# Patient Record
Sex: Female | Born: 1935 | Race: White | Hispanic: No | Marital: Married | Smoking: Never smoker
Health system: Southern US, Community
[De-identification: ages and names within clinical notes are randomized; demographics above are authoritative.]

## PROBLEM LIST (undated history)

## (undated) DIAGNOSIS — N189 Chronic kidney disease, unspecified: Secondary | ICD-10-CM

## (undated) DIAGNOSIS — K229 Disease of esophagus, unspecified: Secondary | ICD-10-CM

## (undated) DIAGNOSIS — I219 Acute myocardial infarction, unspecified: Secondary | ICD-10-CM

## (undated) DIAGNOSIS — E049 Nontoxic goiter, unspecified: Secondary | ICD-10-CM

## (undated) DIAGNOSIS — M549 Dorsalgia, unspecified: Secondary | ICD-10-CM

## (undated) DIAGNOSIS — K439 Ventral hernia without obstruction or gangrene: Secondary | ICD-10-CM

## (undated) DIAGNOSIS — K5790 Diverticulosis of intestine, part unspecified, without perforation or abscess without bleeding: Secondary | ICD-10-CM

## (undated) DIAGNOSIS — I639 Cerebral infarction, unspecified: Secondary | ICD-10-CM

## (undated) DIAGNOSIS — J984 Other disorders of lung: Secondary | ICD-10-CM

## (undated) DIAGNOSIS — I10 Essential (primary) hypertension: Secondary | ICD-10-CM

## (undated) DIAGNOSIS — G8929 Other chronic pain: Secondary | ICD-10-CM

## (undated) DIAGNOSIS — F259 Schizoaffective disorder, unspecified: Secondary | ICD-10-CM

## (undated) DIAGNOSIS — D649 Anemia, unspecified: Secondary | ICD-10-CM

## (undated) DIAGNOSIS — F250 Schizoaffective disorder, bipolar type: Secondary | ICD-10-CM

## (undated) DIAGNOSIS — M1990 Unspecified osteoarthritis, unspecified site: Secondary | ICD-10-CM

## (undated) DIAGNOSIS — J189 Pneumonia, unspecified organism: Secondary | ICD-10-CM

## (undated) HISTORY — PX: CHOLECYSTECTOMY: SHX55

## (undated) HISTORY — PX: HERNIA REPAIR: SHX51

## (undated) HISTORY — PX: ABDOMINAL HYSTERECTOMY: SHX81

## (undated) HISTORY — PX: BACK SURGERY: SHX140

## (undated) HISTORY — PX: REVISION UROSTOMY CUTANEOUS: SUR1282

---

## 1998-06-03 ENCOUNTER — Ambulatory Visit (HOSPITAL_COMMUNITY): Admission: RE | Admit: 1998-06-03 | Discharge: 1998-06-03 | Payer: Self-pay | Admitting: Family Medicine

## 1998-06-30 ENCOUNTER — Inpatient Hospital Stay (HOSPITAL_COMMUNITY): Admission: EM | Admit: 1998-06-30 | Discharge: 1998-07-03 | Payer: Self-pay | Admitting: *Deleted

## 1998-10-08 ENCOUNTER — Ambulatory Visit (HOSPITAL_COMMUNITY)
Admission: RE | Admit: 1998-10-08 | Discharge: 1998-10-08 | Payer: Self-pay | Admitting: Physical Medicine & Rehabilitation

## 1998-10-08 ENCOUNTER — Encounter: Payer: Self-pay | Admitting: Physical Medicine & Rehabilitation

## 1999-04-27 ENCOUNTER — Emergency Department (HOSPITAL_COMMUNITY): Admission: EM | Admit: 1999-04-27 | Discharge: 1999-04-28 | Payer: Self-pay | Admitting: Internal Medicine

## 1999-05-01 ENCOUNTER — Emergency Department (HOSPITAL_COMMUNITY): Admission: EM | Admit: 1999-05-01 | Discharge: 1999-05-01 | Payer: Self-pay | Admitting: Emergency Medicine

## 1999-05-01 ENCOUNTER — Encounter: Payer: Self-pay | Admitting: Emergency Medicine

## 1999-06-26 ENCOUNTER — Emergency Department (HOSPITAL_COMMUNITY): Admission: EM | Admit: 1999-06-26 | Discharge: 1999-06-26 | Payer: Self-pay | Admitting: Emergency Medicine

## 1999-06-27 ENCOUNTER — Emergency Department (HOSPITAL_COMMUNITY): Admission: EM | Admit: 1999-06-27 | Discharge: 1999-06-27 | Payer: Self-pay | Admitting: Emergency Medicine

## 1999-06-28 ENCOUNTER — Inpatient Hospital Stay (HOSPITAL_COMMUNITY): Admission: EM | Admit: 1999-06-28 | Discharge: 1999-07-01 | Payer: Self-pay | Admitting: *Deleted

## 1999-07-09 ENCOUNTER — Inpatient Hospital Stay (HOSPITAL_COMMUNITY): Admission: EM | Admit: 1999-07-09 | Discharge: 1999-07-12 | Payer: Self-pay | Admitting: *Deleted

## 1999-10-20 ENCOUNTER — Inpatient Hospital Stay (HOSPITAL_COMMUNITY): Admission: EM | Admit: 1999-10-20 | Discharge: 1999-10-23 | Payer: Self-pay | Admitting: Emergency Medicine

## 2002-08-27 ENCOUNTER — Encounter
Admission: RE | Admit: 2002-08-27 | Discharge: 2002-11-25 | Payer: Self-pay | Admitting: Physical Medicine & Rehabilitation

## 2002-11-26 ENCOUNTER — Encounter
Admission: RE | Admit: 2002-11-26 | Discharge: 2003-02-24 | Payer: Self-pay | Admitting: Physical Medicine & Rehabilitation

## 2003-02-25 ENCOUNTER — Encounter
Admission: RE | Admit: 2003-02-25 | Discharge: 2003-05-26 | Payer: Self-pay | Admitting: Physical Medicine & Rehabilitation

## 2003-05-29 ENCOUNTER — Encounter
Admission: RE | Admit: 2003-05-29 | Discharge: 2003-08-27 | Payer: Self-pay | Admitting: Physical Medicine & Rehabilitation

## 2003-09-02 ENCOUNTER — Encounter
Admission: RE | Admit: 2003-09-02 | Discharge: 2003-12-01 | Payer: Self-pay | Admitting: Physical Medicine & Rehabilitation

## 2003-12-31 ENCOUNTER — Encounter
Admission: RE | Admit: 2003-12-31 | Discharge: 2004-03-30 | Payer: Self-pay | Admitting: Physical Medicine & Rehabilitation

## 2004-01-01 ENCOUNTER — Ambulatory Visit: Payer: Self-pay | Admitting: Physical Medicine & Rehabilitation

## 2004-10-18 ENCOUNTER — Encounter
Admission: RE | Admit: 2004-10-18 | Discharge: 2005-01-16 | Payer: Self-pay | Admitting: Physical Medicine & Rehabilitation

## 2004-10-18 ENCOUNTER — Ambulatory Visit: Payer: Self-pay | Admitting: Physical Medicine & Rehabilitation

## 2005-04-15 ENCOUNTER — Ambulatory Visit: Payer: Self-pay | Admitting: Physical Medicine & Rehabilitation

## 2005-04-15 ENCOUNTER — Encounter
Admission: RE | Admit: 2005-04-15 | Discharge: 2005-07-14 | Payer: Self-pay | Admitting: Physical Medicine & Rehabilitation

## 2005-09-13 ENCOUNTER — Encounter
Admission: RE | Admit: 2005-09-13 | Discharge: 2005-12-12 | Payer: Self-pay | Admitting: Physical Medicine & Rehabilitation

## 2005-09-23 ENCOUNTER — Ambulatory Visit: Payer: Self-pay | Admitting: Physical Medicine & Rehabilitation

## 2006-01-20 ENCOUNTER — Encounter
Admission: RE | Admit: 2006-01-20 | Discharge: 2006-04-20 | Payer: Self-pay | Admitting: Physical Medicine & Rehabilitation

## 2006-01-20 ENCOUNTER — Ambulatory Visit: Payer: Self-pay | Admitting: Physical Medicine & Rehabilitation

## 2006-07-19 ENCOUNTER — Ambulatory Visit: Payer: Self-pay | Admitting: Physical Medicine & Rehabilitation

## 2006-07-19 ENCOUNTER — Encounter
Admission: RE | Admit: 2006-07-19 | Discharge: 2006-10-17 | Payer: Self-pay | Admitting: Physical Medicine & Rehabilitation

## 2006-11-23 ENCOUNTER — Ambulatory Visit: Payer: Self-pay | Admitting: Physical Medicine & Rehabilitation

## 2006-11-23 ENCOUNTER — Encounter
Admission: RE | Admit: 2006-11-23 | Discharge: 2007-01-02 | Payer: Self-pay | Admitting: Physical Medicine & Rehabilitation

## 2007-03-16 ENCOUNTER — Ambulatory Visit: Payer: Self-pay | Admitting: Physical Medicine & Rehabilitation

## 2007-03-16 ENCOUNTER — Encounter
Admission: RE | Admit: 2007-03-16 | Discharge: 2007-03-19 | Payer: Self-pay | Admitting: Physical Medicine & Rehabilitation

## 2007-04-08 ENCOUNTER — Emergency Department (HOSPITAL_COMMUNITY): Admission: EM | Admit: 2007-04-08 | Discharge: 2007-04-08 | Payer: Self-pay | Admitting: Emergency Medicine

## 2007-06-28 ENCOUNTER — Encounter
Admission: RE | Admit: 2007-06-28 | Discharge: 2007-07-02 | Payer: Self-pay | Admitting: Physical Medicine & Rehabilitation

## 2007-06-28 ENCOUNTER — Ambulatory Visit: Payer: Self-pay | Admitting: Physical Medicine & Rehabilitation

## 2007-10-19 ENCOUNTER — Encounter
Admission: RE | Admit: 2007-10-19 | Discharge: 2007-10-22 | Payer: Self-pay | Admitting: Physical Medicine & Rehabilitation

## 2007-10-22 ENCOUNTER — Ambulatory Visit: Payer: Self-pay | Admitting: Physical Medicine & Rehabilitation

## 2008-02-11 ENCOUNTER — Ambulatory Visit: Payer: Self-pay | Admitting: Physical Medicine & Rehabilitation

## 2008-02-11 ENCOUNTER — Encounter
Admission: RE | Admit: 2008-02-11 | Discharge: 2008-02-11 | Payer: Self-pay | Admitting: Physical Medicine & Rehabilitation

## 2008-06-02 ENCOUNTER — Encounter
Admission: RE | Admit: 2008-06-02 | Discharge: 2008-06-03 | Payer: Self-pay | Admitting: Physical Medicine & Rehabilitation

## 2008-06-03 ENCOUNTER — Ambulatory Visit: Payer: Self-pay | Admitting: Physical Medicine & Rehabilitation

## 2010-08-17 NOTE — Assessment & Plan Note (Signed)
REASON FOR VISIT:  [name] returns to clinic today for followup  evaluation.  She does need to refill on her Oxycodone which she  continues to take approximately 10 tablets per day.  She gets reasonable  relief and tries to stay as active as possible.  She is requesting a  repeat script for a power scooter which she has had in the past.  The  present scooter that she has is worn down and she needs a replacement.  She reports that the scooter store has told her that she needs a script  from [place] to start the process to obtain one.   REVIEW OF SYSTEMS:  Positive for easy bleeding, diarrhea and shortness  of breath.   MEDICATIONS:  1. Oxycodone 5 mg 2 tablets p.o. 5 times per day p.r.n.  2. Elavil q.h.s.  3. Labetalol 100 mg.  4. Pepcid daily.  5. Diazepam 5 mg b.i.d. p.r.n.   PHYSICAL EXAMINATION:  GENERAL:  A well-appearing elderly adult female  in mild to no acute discomfort.  VITAL SIGNS:  Blood pressure 131/47 with a pulse of 84, respiratory rate  20 and O2 saturation 93% on room air.  EXTREMITIES:  She is 4+ to -5/5 strength to upper and lower extremities.  She ambulates with single point cane.   IMPRESSION:  1. Chronic low back pain with moderate lumbar spondylosis.  2. Right knee pain, status post arthroscopic surgery.  3. We did refill the patient's Oxycodone as of December 08, 2006.  We      also gave her a prescription for replacement power scooter which      she can obtain through the store that she had obtained her previous      one through.  4. We will plan on seeing her in followup in approximately 4 month's      time with refills prior to that appointment as necessary.           ______________________________  Ellwood Dense, M.D.     DC/MedQ  D:  11/24/2006 09:56:54  T:  11/25/2006 11:11:09  Job #:  [id number]

## 2010-08-17 NOTE — Assessment & Plan Note (Signed)
Rhonda [name] returns to the clinic today for followup evaluation.   She had been using oxycodone but we had to switch her to the  oxycodone/Tylenol at a 10/325 strength.  She reports that she has been  taking one of those which should be the equivalent of the oxycodone 5 mg  two tablets.  Unfortunately, she is not getting the benefit that she  needs.  I did speak with the pharmacist at the [facility] in [place].  They reported that they plan to have a shipment of oxycodone 5 mg coming  in this Wednesday, July 04, 2007.  In the meantime, they do have  oxycodone elixir at 5 mg/5 ml.   MEDICATIONS:  1. Percocet 10/325 one tablet 5 times per day p.r.n.  2. Elavil nightly.  3. Labetalol 100 mg daily.  4. Pepcid daily.  5. Diazepam 5 mg b.i.d. p.r.n.   REVIEW OF SYSTEMS:  Noncontributory.   PHYSICAL EXAMINATION:  GENERAL:  An elderly adult female in mild acute  discomfort.  VITAL SIGNS:  Blood pressure 144/69 with a pulse of 79, respiratory rate  18, and O2 saturation 93% on room air.  MUSCULOSKELETAL:  She ambulates without any assistive device.  She has 4  plus over 5 strength throughout the bilateral upper and lower  extremities.   IMPRESSION:  1. Chronic low back pain with moderate lumbar spondylosis.  2. Right knee pain, status post arthroscopic surgery.   In the office today, we did give the patient a script for oxycodone  elixir at 5 mg/5 ml to take 5-10 ml p.o. five times per day.  This will  be the equivalent of the oxycodone that she was taking previously.  We  have also given a new script for this straight oxycodone at 5 mg to be  used 1-2 tablets p.o. 5 times per day as needed as of July 05, 2007.  The pharmacy is reporting that they should have the supply in April 1st  or 2nd of this coming month.  We will plan on seeing the patient in  followup in approximately 3 to 4 months' time.  She understands that she  needs to take the oxycodone elixir until the oxycodone  tablets come in  and then switch over to the oxycodone.  She should stop the  oxycodone/Tylenol immediately at this point and save the pills that she  still has at home.  We will plan on seeing her in followup as noted  above.           ______________________________  Ellwood Dense, M.D.     DC/MedQ  D:  07/02/2007 12:23:26  T:  07/02/2007 12:59:09  Job #:  [id number]

## 2010-08-17 NOTE — Assessment & Plan Note (Signed)
Rhonda [name] returns to clinic today accompanied by her husband.  Overall,  she is doing well.  She has cut down her oxycodone slightly to  approximately 8 tablets per day and occasionally up to 10.  She does  need a refill on her oxycodone over the next several days but has a  sufficient supply at present.  She has obtained a scooter and uses that  regularly outside especially going to church on a weekly basis.  She is  very thankful to have that scooter available to her.   MEDICATIONS:  1. Oxycodone 5 mg 1-2 tablets p.o. 5 times per day p.r.n. (4-10 per      day).  2. Elavil nightly.  3. Labetalol 100 mg daily.  4. Pepcid daily.  5. Diazepam 5 mg b.i.d. p.r.n.   REVIEW OF SYSTEMS:  Positive for easy bleeding, constipation, and  shortness of breath.   PHYSICAL EXAMINATION:  GENERAL:  Elderly adult female in mild acute  discomfort.  VITAL SIGNS:  Not obtained.  EXTREMITIES:  She ambulates without any assistive device with a  shuffling gait.  She has 4+/5 strength throughout the bilateral upper  and lower extremities.   IMPRESSION:  1. Chronic low back pain with moderate lumbar spondylosis.  2. Right knee pain status post arthroscopic surgery.   In the office today, we did refill the patient's oxycodone as of  February 20, 2008.  She continues to do well overall and has actually  decreased the use of pain medicines.  She continues to use it very  reasonably without significant side effects or signs of diversion.  We  will plan on seeing the patient in followup in this office in  approximately 3-4 months' time with refills prior to that appointment as  necessary.           ______________________________  Ellwood Dense, M.D.     DC/MedQ  D:  02/11/2008 14:06:29  T:  02/12/2008 02:48:52  Job #:  [id number]

## 2010-08-17 NOTE — Assessment & Plan Note (Signed)
Ms. Welle returns today accompanied by her husband.  She has had no new  medical problems other than feeling that she may have some pain in her  flank area.  She has had a prior history of kidney stones.  She started  on MSM, which is an over-the-counter medication.  She states this has  helped her leg pain.  She states her average pain is 2/10, but it is up  to an 8 right now, interferes with activity at 7/10.  Sleep is fair.  Pain is worse with walking, bending, sitting, and standing.  Improves  with rest, heat, medications, and injections.  Relief from meds is good.  She thinks that taking 2 oxycodone 5 mg is better than taking one 10 mg.   INTERVAL MEDICAL HISTORY:  As seen Dr. Craige Cotta from [facility].  He has ordered MRI of the lumbar spine which he has not reviewed yet.   Her other review of system is positive for nausea and reflux.   SOCIAL HISTORY:  She is married, lives with her husband, who accompanies  her here today.   FAMILY HISTORY:  High blood pressure.   PAST SURGICAL HISTORY:  She has had 2 back surgeries which are fairly  remote.  She has had hysterectomy, hernia, and gallbladder surgery.  She  has a history of kidney stones.  She has a history of renal  insufficiency in the past as well.  She has some arthroscopic surgery on  the right knee in the past.   PHYSICAL EXAMINATION:  VITAL SIGNS:  Blood pressure 158/57, pulse 84,  respirations 20, and O2 sat 94% on room air.  GENERAL:  Overweight female in no acute stress.  Orientation x3.  Affect  is alert.  MUSCULOSKELETAL:  Gait, she has a stenotic-appearing gait.  She has no  toe drag or knee instability, but has decreased step length and wide  base support.  Her deep tendon reflexes are reduced in bilateral lower  extremities at the knees and ankles.  Her sensation is intact.  Her  coordination is intact.  Movements are slow.  Her extremities show no  evidence of edema.  Her lumbar spine range of motion  is fixed with  forward flexion, but only gets neutral on extension.  She has mild  scoliosis evident in the thoracolumbar junction levoconvex.  Lower  extremity strength is 5/5.  Hip range of motion is normal as his knee  and ankle range of motion.  Motor strength is normal in the lower  extremities as well as upper extremities.   IMPRESSION:  Chronic low back pain.  She appears to have some lumbar  spinal stenosis at least based on his physical exam and likely some  radicular symptoms.  I will be curious to see what her MRI shows.  She  may have the lumbosacral facet syndrome, which may be accounting for  back pain as well.  I asked her to get a copy of the MRI, so that I can  look at it.  We can think about doing some medial branch blocks or  epidural steroid injection.  We will continue on the current  medications.  I indicated that I am more comfortable for using 10 mg  oxycodone rather than having so many 5 mg.  He indicated that given her  age, she would have a high fall risk.  She was getting a 3 out of 60  tablets of the 5 mg and we will go to  the 10 mg and give #90 per month.  Her pain score has really not changed significantly.  Her average pain  is 2/10 currently.  I will see her back in about a month's time.  We  checked urine drug screen today.      Erick Colace, M.D.  Electronically Signed     AEK/MedQ  D:  06/03/2008 14:27:08  T:  06/04/2008 03:43:44  Job #:  [id number]

## 2010-08-17 NOTE — Assessment & Plan Note (Signed)
HISTORY:  Ms. Puzzo returns to clinic today accompanied by her husband.  Overall, she is doing well.  She is using her oxycodone but not nearly  as many tablets per day as she was previously.  We last gave her a  prescription on September 24, 2007, and she still has a total of 150  remaining.  She apparently has cut back to anywhere from 4-10 per day of  the oxycodone and previously had used as many as 12 per day.  She still  reports no confusion or constipation with the medication.   The patient last had kidney stones approximately 3 years ago and has  been doing well since that time.   MEDICATIONS:  1. Oxycodone 5 mg 1-2 tablets p.o. 6 times per day p.r.n. (4-10 per      day).  2. Elavil at bedtime.  3. Labetalol 100 mg.  4. Pepcid daily.  5. Diazepam 5 mg b.i.d. p.r.n.   REVIEW OF SYSTEMS:  Positive for night sweats and diarrhea.   PHYSICAL EXAMINATION:  GENERAL:  Elderly adult female in mild to no  acute discomfort.  VITAL SIGNS:  Blood pressure 119/70 with pulse of 82 and O2 saturation  91% on room air.  EXTREMITIES:  She ambulates without any assistive device with a  shuffling gait.  She has 4+/5 strength throughout the bilateral upper  and lower extremities.   IMPRESSION:  1. Chronic low back pain with moderate lumbar spondylosis.  2. Right knee pain, status post arthroscopic surgery.   In the office today, we did refill the patient's oxycodone, a total of  360 to be taken 1-2 tablets p.o. 6 times per day p.r.n. filled November 16, 2007; her present supply should last her till that time.  She  understands that she needs to guard this prescription and hand it in on  the prescribed date.  We will plan on seeing her in followup in  approximately 4 months' time, with refills prior to that appointment as  necessary.           ______________________________  Ellwood Dense, M.D.     DC/MedQ  D:  10/22/2007 12:28:40  T:  10/23/2007 05:56:02  Job #:  [id number]

## 2010-08-17 NOTE — Assessment & Plan Note (Signed)
Ms. Nell returns to the clinic today accompanied by her husband.  Overall, she is doing fairly well.  She does use the oxycodone 5 mg 2  tablets anywhere from 4 to 5 times per day, a maximum of 8-10 per day.  She reports that she gets reasonable relief with the medication.  Other  medicines have been unchanged.   CURRENT MEDICATIONS:  1. Oxycodone 5 mg 2 tablets 5 times per day p.r.n. (8-10 per day).  2. Elavil nightly.  3. Labetalol 100 mg daily.  4. Pepcid daily.  5. Diazepam 5 mg b.i.d. p.r.n.   REVIEW OF SYSTEMS:  Positive for high blood sugar and shortness of  breath along with night sweats.   PHYSICAL EXAM:  A well-appearing elderly adult female in mild to no  acute discomfort.  Blood pressure 130/54, pulse 80, respiratory rate 18, and O2 saturation  91% on room air.  She has 4+/5 strength throughout the bilateral upper and lower  extremities.  She ambulates with a single-point cane.   IMPRESSION:  1. Chronic low back pain with moderate lumbar spondylosis.  2. Right knee pain, status post arthroscopic surgery.   In the office today, we did refill the patient's oxycodone a total of  300, maximum of 10 per day as of March 21, 2007.  She has a  sufficient supply until Wednesday of this week.  She will be finishing  up her Macrodantin for recent urinary tract infection with IV  antibiotics being used for 5 days.  She has a history of frequent  urinary tract infections with kidney stones.  We will plan on seeing her  in followup in approximately 4 months' time with refills prior to that  appointment if necessary.           ______________________________  Ellwood Dense, M.D.     DC/MedQ  D:  03/19/2007 10:56:11  T:  03/19/2007 15:01:51  Job #:  [id number]

## 2010-08-20 NOTE — Discharge Summary (Signed)
[place]  Patient:    Rhonda [name], Rhonda [name]                        MRN: [id number] Adm. Date:  [id number] Disc. Date: [id number] Attending:  Anastasio Auerbach CC:         Redmond Baseman, M.D.             Francisca December, M.D.             Dr. Paticia Stack, [facility], [place]                           Discharge Summary  DATE OF BIRTH:  Aug 22, 1930.  DISCHARGE DIAGNOSES:  1. Chest pain.     a. Presumed gastroesophageal reflux disease.     b. Adenosine Cardiolite (September 06, 1999):  Normal, ejection fraction 86%.     c. A 2D echo (June 2001): Normal except for mild mitral regurgitation.     d. Spiral CT (October 20, 1999): No pulmonary embolus, bilateral lower lobe        interstitial disease.     e. Lower extremity Dopplers (October 20, 1999):  No deep venous thrombosis.  2. Chronic lung disease.     a. Nocturnal hypoxia documented.     b. Pulmonary function tests:  Decreased vital capacity, decreased        diffusion capacity (46%).     c. Admission arterial blood gas on room air (7.42/32/70).     d. Previous work-up (August 2000):  Negative pulmonary angiogram for        pulmonary embolus.  3. Complaints of night sweats x 1 year, significance unclear.  4. Bilateral metacarpophalangeal joint swelling x months, significance     unclear.  5. Chronic low back pain.     a. Status post surgery x 2.  6. Chronic lower extremity aches and pains.     a. Right lower extremity increased in size over left lower extremity        x 9 months.     b. No deep venous thrombosis by Dopplers this admission.  7. ? chronic renal insufficiency.     a. Creatinine 1.4 this admission.  8. Anemia (admission hemoglobin 10.5, MCV 83).     a. History of iron-deficiency and B12 deficiency.     b. This admission iron 30, total iron binding capacity 376, percent        saturation 8, B12 794, RBC folate 578.     c. TSH 1.83.  9. Mild leukopenia.     a. Total white count 3300, ANC 2300, ALC  700.     b. Platelets normal. 10. Chronically elevated right hemidiaphragm with chronic right lower lobe     atelectasis. 11. History of esophageal stricture/esophagitis.     a. Status post dilation in 1997.     b. Presented with symptoms of dysphagia. 12. Cystectomy with left ureterostomy (external patch).     a. History of neurogenic bladder secondary to cysts on the spine.     b. History of multiple surgeries prior to removal. 13. Multiple abdominal wall hernias, chronic.     a. Last attempted repair one year ago at [facility]. 14. Diverticular disease. 15. Anxiety.  DISCHARGE MEDICATIONS: 1. Risperdal 1.5 mg q.h.s. 2. Elavil 50 mg q.h.s. 3. OxyContin 10 mg q.6h. 4. Valium 5 mg q.d. 5. Aspirin 325 mg q.d. 6.  Oxygen two liters at night. 7. B12 shots monthly. 8. (New) Protonix 40 mg q.d.  CONDITION UPON DISCHARGE:  Rhonda [name] was pain-free 24 hours prior to discharge.  Vital signs were stable.  DISPOSITION:  The patient was discharged home with her family.  CONSULTANTS: 1. Barbaraann Share, M.D. 2. Francisca December, M.D.  PROCEDURES: 1. Spiral chest CT (October 20, 1999):  No CT evidence of acute PE.  Hazy    bilateral lower lobe opacity.  Although some of this may be related to    chronic interstitial disease, superimposed acute alveolitis could have    similar appearance (no clinical basis for this).  No CT evidence of DVT. 2. Chest x-ray (October 20, 1999):  Stable elevation of the right hemidiaphragm    with bibasilar atelectasis. 3. EKG:  Normal sinus rhythm.  No abnormalities. 4. Lower extremity Dopplers (October 21, 1999):  No DVT, SVT or Bakers cyst. 5. Abdominal ultrasound (October 23, 1999):  Small gallstone versus gallbladder    polyp.  Echogenic left renal cortex with associated thinning.  No active    abdominal process evident. 6. Upper GI series (October 22, 1999):  Preliminary report obtained orally    was negative.  Dictated report is still not available on the date of  this    dictation.  HOSPITAL COURSE:  #1 - CHEST PAIN.  Rhonda [name] is a 77 year old female with chronic lung disease requiring oxygen at night who presents with severe left-sided chest tightness which she awakened with in the morning.  She had associated shortness of breath, diaphoresis and nausea.  She presented to her primary care doctors office and was referred to the emergency room.  She received nitroglycerin with some relief.  Serial EKGs and enzymes were completely normal.  Rhonda [name] was placed on telemetry and had no abnormalities.  We asked Dr. Amil Amen to see the patient.  He had previously seen her in June for some complaints of nighttime anterior chest discomfort and he performed a Cardiolite which was normal in June of this year.  He talked with the patient and felt that her symptoms were not related to cardiac etiology.  We did look for pulmonary embolus on this admission.  She had a negative spiral CT and negative Dopplers and a negative D-dimer.  In reviewing her old records and talking with her pulmonologist, Dr. Paticia Stack, she had been seen in August of last year for her shortness of breath and received extensive work-up including ultimately pulmonary angiogram to rule out PE.  This was negative.  Rhonda [name] did have an AA gradient on admission with a pH of 7.42, pCO2 of 32 and a pO2 of 70 but again, she has significant underlying lung disease.  Rhonda [name] subsequently underwent gallbladder ultrasound and upper GI series, neither of which demonstrated clear etiology for symptoms.  She was placed on Protonix empirically on admission.  On the day of discharge she had been symptom-free for over 24 hours.  We felt it was reasonable to send her home with follow-up with her primary care physician.  She will return if her symptoms recur.  #2 - ANEMIA.  Rhonda [name] did not have a recent baseline hemoglobin.  She does have a history of iron-deficiency as well as B12  deficiency and her hemoglobin  was 10.5 with an MCV of 83 this admission.  I did do iron studies which again demonstrated an iron-deficiency anemia. She will follow up with Redmond Baseman, M.D., and at that  time I would recommend placing her on iron therapy and referring her back to her gastroenterologist in [place], [place], unless there is other clear etiology of why she is iron deficient.  #3 - CHRONIC LUNG DISEASE.  Rhonda [name] has had chronic lung disease followed by Dr. Paticia Stack.  In April of this year she had documented saturations lower than 90% for greater than one third of the night.  For this reason she was placed on nocturnal oxygen.  Rhonda [name] demonstrated lower lobe abnormalities by CT scan.  The radiologist questioned alveolitis but the patient was not having any increased cough or other difficulties to suggest this to be an acute problem. I did have Dr. Shelle Iron see her here and he felt that her chest pain was not related to her underlying lung problems.  She does have atelectasis which increases the prominence of lung markings on CT scan.  There was ground glass infiltrates in the superior segments which etiology is unclear. There was nothing to suggest pneumonia.   The patient will follow up with Dr. Paticia Stack.  DISCHARGE LABS:  Hemoglobin 9.8, MCV 84, wbc 3600, platelet count 152.  Retic percentage 1.3.  PT and PTT normal.  D-dimer 0.21 (reference range 0-0.48). Comprehensive metabolic panel normal.  Serial cardiac enzymes normal.  TSH 1.311.  Iron 30, TIBC 376, percent saturation 8.  B12 794, rbc folate 578. Rheumatoid factor 39 (reference range 0-20).  ANA pending upon discharge.  ADDENDUM:  Weight loss and joint pain.  Rhonda [name] gives a history of some weight loss, night sweats and joint swelling.  I could not document any fevers nor weight discrepancies from old charts.  We did do a rheumatoid factor which was borderline at 30.  ANA was pending upon  discharge. Will defer further work-up to Dr. Modesto Charon as an outpatient. DD:  10/26/99 TD:  10/28/99 Job: [id number]

## 2010-08-20 NOTE — Assessment & Plan Note (Signed)
HISTORY OF PRESENT ILLNESS:  Rhonda [name] returns to the clinic today for  followup evaluation accompanied by her husband.  Overall, she is doing  reasonably well.  She has had repeat urinary tract infections with a history  of recent lithotripsy for renal calculi.  She is on Amoxicillin chronically  at this time.   In terms of her pain medicines, she uses the Oxycodone but has to take 2 at  a time.  She generally takes 2 tablets 3 to 4 times per day.  Most of the  time, she is able to get away with taking 6 but occasionally has to go up to  8.  She would like to have 8 available to her on a daily basis.  She does  not overuse the medication.  She reports that she tries to get out as much  as possible and continues to drive her vehicle as she wishes.   MEDICATIONS:  1.  Oxycodone 5 mg, 2 tablets q.i.d. p.r.n.  2.  Elavil at night.  3.  Valium p.r.n.  4.  Sorbitol p.r.n.  5.  Pepcid daily.   PHYSICAL EXAMINATION:  GENERAL:  An elderly, frail, adult female in mild to  no acute discomfort.  VITAL SIGNS:  Blood pressure is 135/64 with a pulse of 88, respiratory rate  16, and O2 saturation 96% on room air.  PAIN AND REHAB EVALUATION:  She has 5-/5 strength in the bilateral upper and  lower extremities.  Bulk and tone were normal, and reflexes were 2+ and  symmetrical.   IMPRESSION:  1.  Chronic low back pain with moderate lumbar spondylosis (721.3).  2.  Right knee pain, status post arthroscopic surgery (719.46).   In the office today, we did refill the patient's Oxycodone at 5 mg, 2  tablets p.o. q.i.d., a total of 240.  We will plan on seeing the patient in  followup in this office in approximately 4 to 6 months time with refills of  medication prior to that appointment as necessary.           ______________________________  Ellwood Dense, M.D.     DC/MedQ  D:  04/18/2005 11:55:30  T:  04/18/2005 18:52:39  Job #:  [id number]

## 2010-08-20 NOTE — Assessment & Plan Note (Signed)
REASON FOR VISIT:  [name] returns to clinic today for followup  evaluation.  She has been seeing a urologist by the name of Dr. Orvil Feil.  This physician is at [facility].  He apparently has been monitoring her  creatinine and advised her that she may need dialysis.  He has set her up to  see a nephrologist, Dr. Julien Girt, approximately April 05, 2006.  The patient  reports that the only numbers that she remembers are creatinine level of 1.7  some week ago.   The patient reports that she is getting good relief from her oxycodone use,  5 mg, usually two tablets q.i.d.  She occasionally uses more than 8 per day  but on other days uses less than 8 tablets averaging a total of 8 per day.  She does not need a refill on those in the office today.   The patient reports that she recently completed an antibiotic for a urinary  tract infection.   MEDICATIONS:  1. Oxycodone 5 mg, two tablets q.i.d. p.r.n.  2. Elavil q.h.s.  3. Labetalol 100 mg daily.  4. Pepcid daily.  5. Diazepam 5 mg b.i.d. p.r.n.   REVIEW OF SYSTEMS:  Positive for night sweats, fevers and chills, easy  bleeding and shortness of breath.   PHYSICAL EXAMINATION:  GENERAL APPEARANCE:  Reasonably well-appearing,  elderly, female in mild to no acute discomfort.  VITAL SIGNS:  Blood pressure 119/52 with pulse 78, respiratory rate 16,  oxygen saturation 96% on room air.  MUSCULOSKELETAL:  She has 4+ to 5-/5 strength throughout the bilateral upper  and lower extremities.  She ambulates without any assistive devices.   IMPRESSION:  1. Chronic low back pain with moderate lumbar spondylosis (721.3).  2. Right knee pain, status post arthroscopic surgery (719.46).   PLAN:  In the office today, no refill on her oxycodone is necessary.  We  have given her a refill on her Diazepam at 5 mg one p.o. b.i.d.  She is in  between seeing her new primary care physician.  That appointment is not  until early November and I think she will run  out of the Diazepam by that  time.  I have asked her to have future refills through her new primary care  physician, once she is established in early November.   In terms of her renal function, she will wait to get the second opinion with  Dr. Julien Girt, her nephrologist-to-be.  She already has had Dr. Orvil Feil  recommend possible dialysis.  Apparently no renal vitamins were started and  she was not placed on any diet restrictions at this time.   We will plan on seeing the patient in followup in approximately six months'  time with refills prior to that appointment as necessary.           ______________________________  Ellwood Dense, M.D.    DC/MedQ  D:  01/23/2006 12:21:06  T:  01/24/2006 10:17:45  Job #:  [id number]

## 2010-08-20 NOTE — Discharge Summary (Signed)
[place]  Patient:    Rhonda [name], Rhonda [name]                        MRN: [id number] Adm. Date:  [id number] Disc. Date: [id number] Attending:  Anastasio Auerbach CC:         Redmond Baseman, M.D.             Francisca December, M.D.                           Discharge Summary  DATE OF BIRTH:  04/11/1930.  ADDENDUM:  Results of upper GI series (October 22, 1999):  Small hiatal hernia with reflux up to the mid thorax.  There was evidence of poor motility and propagation.  On multiple views, there was a slight mucosal irregularity noted in the posterior esophagus.  Will attempt to contact patient with these results and forward them to her primary physician, Dr. Leodis Sias.  It will be necessary for her to be referred back to her gastroenterologist for further evaluation. DD:  10/26/99 TD:  10/28/99 Job: [id number]
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
## 2010-08-20 NOTE — Assessment & Plan Note (Signed)
Ms. Doty returns to clinic today accompanied by her husband.  She is  reportedly doing well overall.  She reports that her kidney stones have  been stabilized, although she still has them present.  They are not  causing any obstructive at the present time.  She reports that she  generally takes the oxycodone, approximately 8 tablets per day, but does  feel that she needs an extra small dose periodically when she goes to  the store and tries to do some prolonged walking.  She has been very  compliant with medication to this point.   REVIEW OF SYSTEMS:  Noncontributory.   MEDICATIONS:  1. Oxycodone 5 mg 2 tablets q.i.d. p.r.n.  2. Elavil nightly.  3. Labetalol 100 mg daily.  4. Pepcid daily.  5. Diazepam 5 mg b.i.d. p.r.n.   PHYSICAL EXAMINATION:  A well-appearing elderly adult female in mild  acute discomfort.  Blood pressure is 133/73 with a pulse of 79.  Respiratory rate 16.  Her  O2 saturation is 96% on room air.  Patient has 4+ to 5/5 strength throughout her bilateral upper and lower  extremities.   IMPRESSION:  1. Chronic low back pain with moderate lumbar spondylosis.  2. Right knee pain, status post arthroscopic surgery.   In the office today we did refill the patient's oxycodone as of July 21, 2006.  We will plan on seeing her in followup in approximately 6  months' time with refills prior to that appointment as necessary.  I did  allow her to increase up to occasionally 5 times per day instead of 4  times per day, especially when she is active going to the grocery store  and shopping.           ______________________________  Ellwood Dense, M.D.     DC/MedQ  D:  07/20/2006 10:19:51  T:  07/20/2006 13:15:09  Job #:  [id number]

## 2010-08-20 NOTE — Assessment & Plan Note (Signed)
Rhonda [name] returns to clinic today for follow-up evaluation.  She,  unfortunately, has been hospitalized for approximately 10 days back in late  April.  She was in the intensive care unit for five of those 10 days.  She  was released in late April after having been treated for double pneumonia  along with septicemia related to a urinary tract infection.  She has a  history of ongoing renal stones and has had treatment in the past, but  remains on antibiotic for prophylaxis.  She does report that she gets good  relief from her oxycodone used 5 mg two tablets q.i.d.   MEDICATIONS:  1.  Oxycodone 5 mg two tablets q.i.d. p.r.n.  2.  Elavil q.h.s.  3.  Augmentin b.i.d. x10 days.  4.  Labetalol 100 mg daily.  5.  Sorbitol p.r.n.  6.  Pepcid daily.   REVIEW OF SYSTEMS:  Positive for easy bleeding and coughing along with  shortness of breath.   PHYSICAL EXAMINATION:  GENERAL:  Reasonably well-appearing, elderly female  in mild to no acute distress.  VITAL SIGNS:  Blood pressure 105/63 with a pulse of 76, respiratory rate 16,  and O2 saturation 94% on room air.  NEUROLOGIC:  She has 5-/5 strength throughout the bilateral upper and lower  extremities.  She ambulates without any assistive device.  She has poor  flexion and extension in a standing position in her lumbar spine.   IMPRESSION:  1.  Chronic low back pain with moderate lumbar spondylosis (721.3).  2.  Right knee pain status post arthroscopic surgery (719.46).   In the office today we did refill the patient's oxycodone as of October 08, 2005.  A total of 240 were prescribed which should cover her for one month's  time.  She is doing very well with good analgesic effect and no adverse side  effects.  She shows no sign of apparent addictive qualities.           ______________________________  Ellwood Dense, M.D.     DC/MedQ  D:  09/26/2005 11:45:28  T:  09/26/2005 12:55:23  Job #:  [id number]

## 2010-08-20 NOTE — Assessment & Plan Note (Signed)
[name] returns to clinic today for follow up evaluation.  She is  accompanied to the office today by her husband.  The patient reports that  the Oxycodone medication that she is taking is working well for her at the  present time.  She continues to be as active as possible around the home.  She does occasionally get constipated and then takes Dulcolax tablets in  addition to her daily sorbitol.  She has had some recent increased bowel  movements secondary to the Dulcolax tablets and some extra grumbling of her  abdomen.  She would like to have some medication to use on a regular basis  for improvement in her bowel regimen.   MEDICATIONS:  1. Oxycodone 5 mg 1-3 tablets p.o. q.i.d. p.r.n.  2. Elavil q.h.s.  3. Valium 5 mg p.r.n.  4. Sorbitol daily.  5. Pepcid daily.   PHYSICAL EXAMINATION:  Well-appearing, elderly female in no acute distress.  Blood pressure is measured at 140/74 with a pulse of 83, respiratory rate 16  and O2 saturations 92 to 93% on room air.  Strength was 5 minus/5 throughout  the bilateral upper and lower extremities.  Bulk and tone were normal and  reflexes were 2+ and symmetrical.  The patient ambulates without any  assisted device.   IMPRESSION:  1. Chronic low back pain with moderate lumbar spondylosis (721.3).  2. Right knee pain, status post arthroscopic surgery (719.46).  3. Status post left ankle fracture, stable.   In the clinic today I did refill the patient's Oxycodone as of June 22, 2003, a total of 180 tablets.  That will save her husband a trip in for  refill of medication.   I will plan on seeing the patient in follow up in approximately 3 month's  time.  She  continues to do well and I have given her name of Senokot-S to  be used on a p.r.n. daily basis for improvement in her bowel program.      Ellwood Dense, M.D.   DC/MedQ  D:  06/02/2003 12:43:11  T:  06/02/2003 13:00:38  Job #:  [id number]

## 2010-08-20 NOTE — Assessment & Plan Note (Signed)
INTERVAL HISTORY:  Rhonda [name] returns to clinic today for followup  evaluation.  She does report that she is experiencing kidney stone symptoms  treated by Dr. Saintclair Halsted.  She also reports that Dr. Saintclair Halsted wants to rule  out any gallbladder stones and she is due for a gallbladder study.  She has  had lithotripsy for kidney stones in the past approximately 2-3 years ago.   The patient reports that she is using her oxycodone 5 mg two tablets q.i.d.  with an occasional extra dose.  She was last given the prescription  December 10, 2003 and has approximately 28 left which we will require refill  over the next couple days.  We will refill that for her in the office today.   MEDICATIONS:  1.  Oxycodone 5 mg approximately two tablets q.i.d. p.r.n.  2.  Pepcid once daily.  3.  Sorbitol b.i.d.  4.  Valium 5 mg p.r.n.  5.  Elavil at bedtime.   PHYSICAL EXAMINATION:  Reasonably well-appearing elderly female in no acute  distress.  Blood pressure 119/67 with a pulse of 88, respiratory rate 16 and  O2 saturation 99% on room air.  The patient has 5-/5 strength throughout the  bilateral upper and lower extremities.  She ambulates without any assistive  device but does need contact guard assistance to come to the standing  position.  She has a sliding forward flexed posture at the waist.   IMPRESSION:  1.  Chronic low back pain with moderate lumbar spondylosis (72130).  2.  Right knee pain, status post arthroscopic surgery (16109).   In the office today I did refill her oxycodone 5 mg two tablets p.o. q.i.d.  p.r.n. as of January 03, 2004.  She reports that she has sufficient other  medications at this time.  I have cautioned her about only using an  occasional extra oxycodone in addition to the eight that she uses daily at  this point.   I will plan on seeing her in followup in approximately 3 months' time with  refills through her husband prior to that appointment.       DC/MedQ  D:   01/01/2004 60:45:40  T:  01/01/2004 98:11:91  Job #:  [id number]

## 2010-08-20 NOTE — Consult Note (Signed)
[place]  Patient:    Rhonda [name], Rhonda [name]                        MRN: [id number] Proc. Date: 10/22/99 Adm. Date:  [id number] Attending:  Anastasio Auerbach CC:         Anastasio Auerbach, M.D.             Redmond Baseman, M.D.             Francisca December, M.D.             Barbaraann Share, M.D. [facility]                          Consultation Report  HISTORY OF PRESENT ILLNESS:  Patient is a pleasant 77 year old white female who I have been asked to see for an abnormal chest x-ray/CT scan.  Patient was admitted with chest pain that has both typical and atypical features; however denies pleuritic chest pain.  She had a negative Cardiolite on September 03, 1999 and negative cardiac enzymes on this admission.  An echo also shows excellent LV function and in fact borders on hypertrophic physiology.  On chest x-ray the patient has had a chronically elevated right hemidiaphragm but she is unsure if that has ever been proven to be paralyzed by pleuroscopy.  She sees Dr. ______ in [facility] and is on nocturnal oxygen for O2 desaturation probably related to her diaphragm and obesity.  She has had a documented restriction by PFTs secondary to the above and diminished ELCO; however, it is unclear whether this normalizes with alveolar volume adjustment.  Patient has undergone a spiral CT scan here which is negative for PE, but did show some ground glass infiltrate in the superior segments of both lower lobes.  It also showed increased markings in both bases, right greater than left.  There has been on DVT noted by lower extremity Dopplers.  Patient is chronically dyspneic with no recent acceleration in her symptoms.  She denies any chronic airwave symptoms or disease and her PFTs did not reveal any obstruction.  PAST MEDICAL HISTORY:  Quite extensive. 1. Chronic renal insufficiency. 2. History of anemia. 3. History of right elevated hemidiaphragm. 4. Status post cystectomy with left  ureterostomy/external pouch secondary to    neurogenic bladder. 5. History of multiple abdominal hernias requiring surgical repair. 6. History of diverticular disease. 7. History of anxiety. 8. History of esophageal stricture and esophagitis with dilatation in 1997.  ALLERGIES:  SENNA, TYLENOL, MACRODANTIN.  SOCIAL HISTORY:  She has never smoked.  She is currently married.  FAMILY HISTORY:  Remarkable for her father having coronary disease, but is living at age 14.  She has a brother with coronary artery disease.  REVIEW OF SYSTEMS:  As per history of present illness.  PHYSICAL EXAMINATION:  GENERAL:  She is an obese white female in no acute distress.  VITAL SIGNS:  Blood pressure 140/80.  Pulse 97.  She is afebrile.  Respiratory rate approximately 19-20.  Saturation on 2 L 97%.  HEENT:  Pupils are equal, round and reactive to light.  Extraocular movements are intact.  Nares are patent without discharge.  Oropharynx is clear.  NECK:  Supple without JVD or lymphadenopathy.  No palpable thyromegaly.  CHEST:  Fairly clear breath sounds throughout.  CARDIAC:  Regular rate and rhythm.  No murmurs, rubs, or gallops.  ABDOMEN:  Soft,  nontender with good bowel sounds.  GENITAL:  Not done.  Not indicated.  RECTAL:  Not done.  Not indicated.  BREASTS:  Not done.  Not indicated.  EXTREMITIES:  Lower extremities without significant edema with good pulses distally.  They were mildly diminished, but symmetrical.  There is no calf tenderness.  NEUROLOGIC:  Alert and oriented x 3.  No obvious motor deficits.  LABORATORIES:  Chest x-ray was reviewed and showed atelectasis in both lower lobes, right greater than left due to the elevated right hemidiaphragm.  CT scan as per history of present illness.  IMPRESSION: 1. Atypical and typical chest pain of unknown etiology.  I really see nothing    from a pulmonary standpoint to explain this.  I doubt that she has had a PE    and there  is a very low clinical suspicion by history.  Since her cardiac    work-up has been unrevealing, I would definitely pursue a gastrointestinal    work-up to see if that will lead to an etiology. 2. Basilar pulmonary infiltrates of unknown etiology.  I would be very careful    in calling this interstitial lung disease.  The patient clearly has    atelectasis right greater than left which increases the prominence of the    lung markings on CT scan in the same distribution.  I would be more    concerned about the ground glass densities noted in the superior segments    that are really of unknown etiology.  There is no suggestion of pneumonia    currently.  Nevertheless, the patient is oxygenating well.  Has had no    change in her usual baseline dyspnea and this is being followed by Dr.    ______ as an outpatient.  This is not related to her chest pain.  SUGGESTION:  No further inpatient pulmonary work-up required.  She does need to follow up with Dr. ______ at discharge. DD:  10/23/99 TD:  10/23/99 Job: [id number]

## 2010-08-20 NOTE — Assessment & Plan Note (Signed)
MEDICAL RECORD NUMBER:  [id number].   Ms. Severtson returns to the clinic today for followup evaluation. She reports  that she is doing well overall. Unfortunately, since June of 2005, she has  had two hospitalizations for the same problem. Apparently, they felt that  she had probable colon cancer, and we are evaluating her for that. They  subsequently found out that she had bleeding ulcers, and they treated those,  and her bleeding has resolved. She has been told that she does not have  cancer now at this point.   The patient continues to use the oxycodone 5 mg 2 tablets approximately 4  times a day for a total of 8 per day. She still reports decent relief on the  medication. She does need a refill over the next couple of days and will  refill that as noted below.   MEDICATIONS:  1.  Oxycodone 5 mg approximately 2 tablets q.i.d. p.r.n. (approximately 8      per day).  2.  Elavil q.h.s.  3.  Valium 5 mg q.d. p.r.n.  4.  Sorbitol q.d. p.r.n.  5.  Protonix 40 mg q.d.   PHYSICAL EXAMINATION:  Well-appearing elderly female in no acute distress.  Blood pressure 145/64 with pulse of 88, respiratory rate 16, and O2  saturation 94% on room air. She has 5-/5 strength throughout the bilateral  upper and lower extremities. Bulk and tone were normal, and reflexes were 2+  and symmetrical.   IMPRESSION:  1.  Chronic low back with moderate lumbar spondylosis (721.3).  2.  Right knee pain, status post arthroscopic surgery (719.46).   In the clinic today, I did refill her oxycodone 5 mg 2 tablets p.o. q.i.d.  p.r.n., a total of 120 as of tomorrow. We will plan on seeing the patient in  followup in approximately six months' time with refills prior to that  appointment if necessary. She continues to be extremely compliant with all  medications with no diversion or adverse behavior. She continues to tolerate  the medications without significant side effects.       DC/MedQ  D:  03/22/2004  13:15:36  T:  03/23/2004 13:03:57  Job #:  [id number]

## 2010-08-20 NOTE — Assessment & Plan Note (Signed)
MEDICAL RECORD NUMBER:  [id number]   DATE OF BIRTH:  [birth year]   REASON FOR EVALUATION:  Rhonda [name] returns to the clinic today for followup  evaluation.  She reports that overall she is doing well.  She has passed a  kidney stone approximately 3 weeks ago through her urostomy tube and reports  that her pain has improved.  She continues to take the oxycodone for her  chronic low back pain and she generally takes 8 tablets per day.  She did  have a refill approximately 10 days ago, but will need a refill in early  August.  She continues to be followed by her primary care physician, Dr.  Alben Spittle, and her GI specialist, Dr. Bryson Dames.  Dr. Bryson Dames is apparently  evaluating her presently for diarrhea non-responsive to over the counter  antidiarrheal medicines.   MEDICATIONS:  1.  Oxycodone 5 mg approximately two tablets 4 times daily p.r.n.      (approximately 6-8 per day).  2.  Elavil nightly.  3.  Valium p.r.n.  4.  Sorbitol p.r.n.  5.  Pepcid daily.   PHYSICAL EXAMINATION:  GENERAL:  Elderly appearing, pleasant and cooperative  adult female in mild acute discomfort.  VITAL SIGNS:  Blood pressure 126/78 with a pulse of 78, respiratory rate 16,  and O2 saturation 95% on room air.  NEUROMUSCULAR:  She has 5-/5 strength in her bilateral upper and lower  extremities.  Bulk and tone were normal and reflexes were 2+ and  symmetrical.   IMPRESSION:  1.  Chronic low back pain with moderate lumbar spondylosis (721.3).  2.  Right knee pain, status post arthroscopic surgery (719.46).   PLAN:  In the office today, we did refill her oxycodone 5 mg one to two  tablets p.o. 4 times daily p.r.n., a total of 180 as of November 02, 2004.  We  will plan on seeing her in followup in approximately 6 months' time.  She  continues to be very compliant with medication and takes medication only as  necessary.  There has been no sign of any problems related to the medication  at this point.  We will plan  on seeing her in followup as noted above.       DC/MedQ  D:  10/20/2004 14:51:31  T:  10/21/2004 05:30:21  Job #:  [id number]

## 2010-09-23 ENCOUNTER — Emergency Department (HOSPITAL_COMMUNITY)
Admission: EM | Admit: 2010-09-23 | Discharge: 2010-09-23 | Disposition: A | Discharge status: Home / Self Care | Payer: Medicare Other | Attending: Emergency Medicine | Admitting: Emergency Medicine

## 2010-09-23 DIAGNOSIS — G8929 Other chronic pain: Secondary | ICD-10-CM | POA: Insufficient documentation

## 2010-09-23 DIAGNOSIS — M549 Dorsalgia, unspecified: Secondary | ICD-10-CM | POA: Insufficient documentation

## 2010-09-23 DIAGNOSIS — Z79899 Other long term (current) drug therapy: Secondary | ICD-10-CM | POA: Insufficient documentation

## 2011-10-21 ENCOUNTER — Encounter (HOSPITAL_COMMUNITY): Payer: Self-pay | Admitting: *Deleted

## 2011-10-21 ENCOUNTER — Emergency Department (HOSPITAL_COMMUNITY): Payer: Medicare Other

## 2011-10-21 ENCOUNTER — Emergency Department (HOSPITAL_COMMUNITY)
Admission: EM | Admit: 2011-10-21 | Discharge: 2011-10-21 | Disposition: A | Discharge status: Home / Self Care | Payer: Medicare Other | Attending: Emergency Medicine | Admitting: Emergency Medicine

## 2011-10-21 DIAGNOSIS — F22 Delusional disorders: Secondary | ICD-10-CM | POA: Insufficient documentation

## 2011-10-21 DIAGNOSIS — M545 Low back pain, unspecified: Secondary | ICD-10-CM | POA: Insufficient documentation

## 2011-10-21 DIAGNOSIS — I10 Essential (primary) hypertension: Secondary | ICD-10-CM | POA: Insufficient documentation

## 2011-10-21 DIAGNOSIS — F209 Schizophrenia, unspecified: Secondary | ICD-10-CM | POA: Insufficient documentation

## 2011-10-21 HISTORY — DX: Essential (primary) hypertension: I10

## 2011-10-21 LAB — URINALYSIS, ROUTINE W REFLEX MICROSCOPIC
Nitrite: POSITIVE — AB
Protein, ur: >300 mg/dL — AB
Specific Gravity, Urine: 1.013 (ref 1.005–1.030)
Urobilinogen, UA: 0.2 mg/dL (ref 0.0–1.0)

## 2011-10-21 LAB — CBC WITH DIFFERENTIAL/PLATELET
HCT: 38.4 % (ref 36.0–46.0)
Hemoglobin: 12.7 g/dL (ref 12.0–15.0)
Lymphocytes Relative: 27 % (ref 12–46)
Lymphs Abs: 1.7 10*3/uL (ref 0.7–4.0)
MCHC: 33.1 g/dL (ref 30.0–36.0)
Monocytes Absolute: 0.6 10*3/uL (ref 0.1–1.0)
Monocytes Relative: 10 % (ref 3–12)
Neutro Abs: 3.8 10*3/uL (ref 1.7–7.7)
RBC: 4.29 MIL/uL (ref 3.87–5.11)
WBC: 6.3 10*3/uL (ref 4.0–10.5)

## 2011-10-21 LAB — URINE MICROSCOPIC-ADD ON

## 2011-10-21 LAB — BASIC METABOLIC PANEL
BUN: 13 mg/dL (ref 6–23)
CO2: 17 mEq/L — ABNORMAL LOW (ref 19–32)
Chloride: 109 mEq/L (ref 96–112)
Creatinine, Ser: 1.66 mg/dL — ABNORMAL HIGH (ref 0.50–1.10)
Glucose, Bld: 101 mg/dL — ABNORMAL HIGH (ref 70–99)

## 2011-10-21 MED ORDER — ONDANSETRON 4 MG PO TBDP
ORAL_TABLET | ORAL | Status: AC
  Filled 2011-10-21: qty 1

## 2011-10-21 MED ORDER — HYDROCODONE-ACETAMINOPHEN 5-325 MG PO TABS
1.0000 | ORAL_TABLET | Freq: Once | ORAL | Status: DC
  Filled 2011-10-21: qty 1

## 2011-10-21 MED ORDER — POTASSIUM CHLORIDE 20 MEQ/15ML (10%) PO LIQD
40.0000 meq | Freq: Once | ORAL | Status: AC
  Administered 2011-10-21: 40 meq via ORAL
  Filled 2011-10-21: qty 30

## 2011-10-21 MED ORDER — OXYCODONE-ACETAMINOPHEN 5-325 MG PO TABS
1.0000 | ORAL_TABLET | Freq: Once | ORAL | Status: AC
  Administered 2011-10-21: 1 via ORAL
  Filled 2011-10-21: qty 1

## 2011-10-21 MED ORDER — ONDANSETRON 4 MG PO TBDP
4.0000 mg | ORAL_TABLET | Freq: Once | ORAL | Status: AC
  Administered 2011-10-21: 4 mg via ORAL
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
## 2011-10-21 NOTE — ED Notes (Signed)
Pt potassium 2.8.  Will take ekg and call md.
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
## 2011-10-21 NOTE — ED Notes (Signed)
Pt reports she is nauseated and has diarr. PT walked to restroom. Refusing zofran at this time.

## 2011-10-31 ENCOUNTER — Encounter (HOSPITAL_COMMUNITY): Payer: Self-pay | Admitting: Emergency Medicine

## 2011-10-31 ENCOUNTER — Emergency Department (HOSPITAL_COMMUNITY): Payer: Medicare Other

## 2011-10-31 ENCOUNTER — Inpatient Hospital Stay (HOSPITAL_COMMUNITY)
Admission: EM | Admit: 2011-10-31 | Discharge: 2011-11-03 | DRG: 683 | Disposition: A | Discharge status: Home / Self Care | Payer: Medicare Other | Attending: Internal Medicine | Admitting: Internal Medicine

## 2011-10-31 DIAGNOSIS — I10 Essential (primary) hypertension: Secondary | ICD-10-CM

## 2011-10-31 DIAGNOSIS — G4700 Insomnia, unspecified: Secondary | ICD-10-CM | POA: Diagnosis present

## 2011-10-31 DIAGNOSIS — B9620 Unspecified Escherichia coli [E. coli] as the cause of diseases classified elsewhere: Secondary | ICD-10-CM | POA: Diagnosis present

## 2011-10-31 DIAGNOSIS — F29 Unspecified psychosis not due to a substance or known physiological condition: Secondary | ICD-10-CM

## 2011-10-31 DIAGNOSIS — D649 Anemia, unspecified: Secondary | ICD-10-CM

## 2011-10-31 DIAGNOSIS — Z932 Ileostomy status: Secondary | ICD-10-CM

## 2011-10-31 DIAGNOSIS — M1990 Unspecified osteoarthritis, unspecified site: Secondary | ICD-10-CM

## 2011-10-31 DIAGNOSIS — N183 Chronic kidney disease, stage 3 unspecified: Secondary | ICD-10-CM | POA: Diagnosis present

## 2011-10-31 DIAGNOSIS — G8929 Other chronic pain: Secondary | ICD-10-CM

## 2011-10-31 DIAGNOSIS — R079 Chest pain, unspecified: Secondary | ICD-10-CM

## 2011-10-31 DIAGNOSIS — N189 Chronic kidney disease, unspecified: Secondary | ICD-10-CM | POA: Diagnosis present

## 2011-10-31 DIAGNOSIS — F22 Delusional disorders: Secondary | ICD-10-CM

## 2011-10-31 DIAGNOSIS — N289 Disorder of kidney and ureter, unspecified: Secondary | ICD-10-CM

## 2011-10-31 DIAGNOSIS — M549 Dorsalgia, unspecified: Secondary | ICD-10-CM | POA: Diagnosis present

## 2011-10-31 DIAGNOSIS — Z79899 Other long term (current) drug therapy: Secondary | ICD-10-CM

## 2011-10-31 DIAGNOSIS — I129 Hypertensive chronic kidney disease with stage 1 through stage 4 chronic kidney disease, or unspecified chronic kidney disease: Secondary | ICD-10-CM | POA: Diagnosis present

## 2011-10-31 DIAGNOSIS — N179 Acute kidney failure, unspecified: Principal | ICD-10-CM | POA: Diagnosis present

## 2011-10-31 DIAGNOSIS — F259 Schizoaffective disorder, unspecified: Secondary | ICD-10-CM | POA: Diagnosis present

## 2011-10-31 DIAGNOSIS — N390 Urinary tract infection, site not specified: Secondary | ICD-10-CM

## 2011-10-31 DIAGNOSIS — R072 Precordial pain: Secondary | ICD-10-CM | POA: Diagnosis present

## 2011-10-31 HISTORY — DX: Unspecified osteoarthritis, unspecified site: M19.90

## 2011-10-31 HISTORY — DX: Schizoaffective disorder, unspecified: F25.9

## 2011-10-31 HISTORY — DX: Anemia, unspecified: D64.9

## 2011-10-31 HISTORY — DX: Pneumonia, unspecified organism: J18.9

## 2011-10-31 HISTORY — DX: Schizoaffective disorder, bipolar type: F25.0

## 2011-10-31 HISTORY — DX: Nontoxic goiter, unspecified: E04.9

## 2011-10-31 LAB — URINALYSIS, ROUTINE W REFLEX MICROSCOPIC
Hgb urine dipstick: NEGATIVE
Nitrite: POSITIVE — AB
Specific Gravity, Urine: 1.022 (ref 1.005–1.030)
Urobilinogen, UA: 0.2 mg/dL (ref 0.0–1.0)
pH: 6.5 (ref 5.0–8.0)

## 2011-10-31 LAB — CBC WITH DIFFERENTIAL/PLATELET
Hemoglobin: 11.2 g/dL — ABNORMAL LOW (ref 12.0–15.0)
Lymphocytes Relative: 31 % (ref 12–46)
Lymphs Abs: 1.1 10*3/uL (ref 0.7–4.0)
MCV: 89.8 fL (ref 78.0–100.0)
Neutrophils Relative %: 55 % (ref 43–77)
Platelets: 146 10*3/uL — ABNORMAL LOW (ref 150–400)
RBC: 3.84 MIL/uL — ABNORMAL LOW (ref 3.87–5.11)
WBC: 3.5 10*3/uL — ABNORMAL LOW (ref 4.0–10.5)

## 2011-10-31 LAB — POCT I-STAT, CHEM 8
Calcium, Ion: 1.46 mmol/L — ABNORMAL HIGH (ref 1.13–1.30)
Glucose, Bld: 92 mg/dL (ref 70–99)
HCT: 36 % (ref 36.0–46.0)
Hemoglobin: 12.2 g/dL (ref 12.0–15.0)
TCO2: 17 mmol/L (ref 0–100)

## 2011-10-31 LAB — RAPID URINE DRUG SCREEN, HOSP PERFORMED
Amphetamines: NOT DETECTED
Cocaine: NOT DETECTED
Opiates: NOT DETECTED
Tetrahydrocannabinol: NOT DETECTED

## 2011-10-31 LAB — D-DIMER, QUANTITATIVE: D-Dimer, Quant: <0.22 ug/mL-FEU (ref 0.00–0.48)

## 2011-10-31 LAB — CARDIAC PANEL(CRET KIN+CKTOT+MB+TROPI): Troponin I: <0.3 ng/mL (ref <0.30)

## 2011-10-31 LAB — POCT I-STAT TROPONIN I: Troponin i, poc: 0.01 ng/mL (ref 0.00–0.08)

## 2011-10-31 MED ORDER — SODIUM CHLORIDE 0.9 % IV SOLN
INTRAVENOUS | Status: DC
  Administered 2011-10-31 – 2011-11-02 (×4): via INTRAVENOUS

## 2011-10-31 MED ORDER — ONDANSETRON HCL 4 MG PO TABS: 4.0000 mg | ORAL_TABLET | Freq: Four times a day (QID) | ORAL | Status: DC | PRN

## 2011-10-31 MED ORDER — SODIUM CHLORIDE 0.9 % IV SOLN: INTRAVENOUS | Status: DC

## 2011-10-31 MED ORDER — DEXTROSE 5 % IV SOLN
1.0000 g | INTRAVENOUS | Status: DC
  Administered 2011-11-01: 1 g via INTRAVENOUS
  Filled 2011-10-31 (×3): qty 10

## 2011-10-31 MED ORDER — ASPIRIN EC 325 MG PO TBEC
325.0000 mg | DELAYED_RELEASE_TABLET | Freq: Every day | ORAL | Status: DC
  Filled 2011-10-31: qty 1

## 2011-10-31 MED ORDER — OXYCODONE-ACETAMINOPHEN 5-325 MG PO TABS
1.0000 | ORAL_TABLET | Freq: Four times a day (QID) | ORAL | Status: DC | PRN
  Administered 2011-10-31: 1 via ORAL
  Filled 2011-10-31: qty 1

## 2011-10-31 MED ORDER — MORPHINE SULFATE 4 MG/ML IJ SOLN
4.0000 mg | Freq: Once | INTRAMUSCULAR | Status: AC
  Administered 2011-10-31: 4 mg via INTRAVENOUS
  Filled 2011-10-31: qty 1

## 2011-10-31 MED ORDER — ACETAMINOPHEN 325 MG PO TABS
650.0000 mg | ORAL_TABLET | ORAL | Status: DC | PRN
  Administered 2011-10-31: 650 mg via ORAL
  Filled 2011-10-31: qty 2

## 2011-10-31 MED ORDER — HEPARIN SODIUM (PORCINE) 5000 UNIT/ML IJ SOLN
5000.0000 [IU] | Freq: Three times a day (TID) | INTRAMUSCULAR | Status: DC
  Administered 2011-10-31 – 2011-11-03 (×8): 5000 [IU] via SUBCUTANEOUS
  Filled 2011-10-31 (×11): qty 1

## 2011-10-31 MED ORDER — ONDANSETRON HCL 4 MG PO TABS: 4.0000 mg | ORAL_TABLET | Freq: Three times a day (TID) | ORAL | Status: DC | PRN

## 2011-10-31 MED ORDER — LORAZEPAM 1 MG PO TABS
1.0000 mg | ORAL_TABLET | Freq: Once | ORAL | Status: AC
  Administered 2011-10-31: 1 mg via ORAL
  Filled 2011-10-31: qty 1

## 2011-10-31 MED ORDER — SODIUM CHLORIDE 0.9 % IV SOLN
Freq: Once | INTRAVENOUS | Status: AC
  Administered 2011-10-31: 17:00:00 via INTRAVENOUS

## 2011-10-31 MED ORDER — ASPIRIN EC 325 MG PO TBEC
325.0000 mg | DELAYED_RELEASE_TABLET | Freq: Every day | ORAL | Status: DC
  Administered 2011-11-01 – 2011-11-02 (×2): 325 mg via ORAL
  Filled 2011-10-31 (×3): qty 1

## 2011-10-31 MED ORDER — ONDANSETRON HCL 4 MG/2ML IJ SOLN
4.0000 mg | Freq: Four times a day (QID) | INTRAMUSCULAR | Status: DC | PRN
  Administered 2011-11-01: 4 mg via INTRAVENOUS
  Filled 2011-10-31: qty 2

## 2011-10-31 MED ORDER — DIPHENHYDRAMINE HCL 25 MG PO CAPS
25.0000 mg | ORAL_CAPSULE | Freq: Once | ORAL | Status: AC
  Administered 2011-10-31: 25 mg via ORAL
  Filled 2011-10-31: qty 1

## 2011-10-31 MED ORDER — ASPIRIN 81 MG PO CHEW
324.0000 mg | CHEWABLE_TABLET | Freq: Once | ORAL | Status: AC
  Administered 2011-10-31: 324 mg via ORAL
  Filled 2011-10-31: qty 4

## 2011-10-31 MED ORDER — SODIUM CHLORIDE 0.9 % IJ SOLN: 3.0000 mL | Freq: Two times a day (BID) | INTRAMUSCULAR | Status: DC

## 2011-10-31 MED ORDER — SODIUM CHLORIDE 0.9 % IV SOLN: INTRAVENOUS | Status: AC

## 2011-10-31 MED ORDER — ZOLPIDEM TARTRATE 5 MG PO TABS: 5.0000 mg | ORAL_TABLET | Freq: Every evening | ORAL | Status: DC | PRN

## 2011-10-31 MED ORDER — POTASSIUM CHLORIDE CRYS ER 20 MEQ PO TBCR
40.0000 meq | EXTENDED_RELEASE_TABLET | Freq: Once | ORAL | Status: AC
  Administered 2011-10-31: 40 meq via ORAL
  Filled 2011-10-31: qty 2

## 2011-10-31 MED ORDER — MORPHINE SULFATE 2 MG/ML IJ SOLN
2.0000 mg | INTRAMUSCULAR | Status: DC | PRN
  Administered 2011-10-31 – 2011-11-02 (×6): 2 mg via INTRAVENOUS
  Filled 2011-10-31 (×8): qty 1

## 2011-10-31 MED ORDER — POTASSIUM CHLORIDE CRYS ER 20 MEQ PO TBCR
40.0000 meq | EXTENDED_RELEASE_TABLET | ORAL | Status: DC
  Administered 2011-10-31: 40 meq via ORAL
  Filled 2011-10-31 (×2): qty 2

## 2011-10-31 MED ORDER — ACETAMINOPHEN 325 MG PO TABS
650.0000 mg | ORAL_TABLET | Freq: Four times a day (QID) | ORAL | Status: DC | PRN
  Administered 2011-11-01: 650 mg via ORAL
  Filled 2011-10-31: qty 2

## 2011-10-31 MED ORDER — DEXTROSE 5 % IV SOLN
1.0000 g | Freq: Once | INTRAVENOUS | Status: AC
  Administered 2011-10-31: 1 g via INTRAVENOUS
  Filled 2011-10-31 (×2): qty 10

## 2011-10-31 MED ORDER — MORPHINE SULFATE ER 15 MG PO TBCR
15.0000 mg | EXTENDED_RELEASE_TABLET | Freq: Three times a day (TID) | ORAL | Status: DC
  Administered 2011-10-31 – 2011-11-03 (×8): 15 mg via ORAL
  Filled 2011-10-31 (×8): qty 1

## 2011-10-31 MED ORDER — ACETAMINOPHEN 650 MG RE SUPP: 650.0000 mg | Freq: Four times a day (QID) | RECTAL | Status: DC | PRN
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
## 2011-10-31 NOTE — ED Notes (Signed)
MD at bedside. 

## 2011-10-31 NOTE — ED Provider Notes (Signed)
Pt presents with psychosis.  Need to r/u medical condition.  Likely need psych eval if cleared  7:30 AM Urinalysis shows evidence of urinary tract infection.  However, pt has urostomy and has not urinary complaint.  No abd pain.  Urine culture sent. Patient will be given Rocephin.  Continue to endorse chronic back pain.  Has xray of back on 7/19 which were unremarkable.  I discussed with my attending, who recommend psych eval.    7:50 AM I have consulted with ACT team, who agrees to see pt for further eval for her psychosis.    Fayrene Helper, PA-C 10/31/11 [phone]

## 2011-10-31 NOTE — ED Provider Notes (Signed)
Medical screening examination/treatment/procedure(s) were conducted as a shared visit with non-physician practitioner(s) and myself.  I personally evaluated the patient during the encounter   Celene Kras, MD 10/31/11 0800

## 2011-10-31 NOTE — Progress Notes (Signed)
Rhonda [name], is a 78 y.o. female,   MRN: [id number]  -  DOB - [birth year]  Outpatient Primary MD for the patient is No primary provider on file.  in for    Chief Complaint  Patient presents with  . Insomnia     Blood pressure 155/75, pulse 76, temperature 97.5 F (36.4 C), temperature source Oral, resp. rate 24, weight 49.896 kg (110 lb), SpO2 98.00%.  Active Problems:  Chest pain  Anemia  HTN (hypertension)   78 yo hx HTN, anemia presented to ED 5am 7/29 cc "have not slept in 21 days" . She was hallucinating and was evaluated for BH/ACT admission.  when workup in ED yields UTI and renal insufficiency. Pt transported to TCU and given rocephin. At 2:50 this afternoon pt complained chest pain. D-dimer within normal limits, EKG with borderline repolarization abnormality. Will admit to tele, gently hydrate, cycle CE.   Lesle Chris. Muskaan Smet, NP

## 2011-10-31 NOTE — ED Notes (Signed)
Patient is resting comfortably. 

## 2011-10-31 NOTE — ED Notes (Signed)
Changed into blue scrubs.

## 2011-10-31 NOTE — ED Provider Notes (Addendum)
Complains of right-sided low back pain rating to right leg. Patient reports that she takes oxycodone chronically for back pain. Percocet ordered by me  Doug Sou, MD 10/31/11 1212room  2:50 PM complains of anterior chest pain worse with deep inspiration, nonradiating patient reports pain onset 30 minutes ago. On exam appears mildly uncomfortable lungs clear auscultation heart regular rate and rhythm chest tender anteriorly  Date: 10/31/2011  Rate: 80  Rhythm: normal sinus rhythm  QRS Axis: normal  Intervals: normal  ST/T Wave abnormalities: nonspecific T wave changes  Conduction Disutrbances:none  Narrative Interpretation:   Old EKG Reviewed: no significant change over 10/15/11 Patient for Mr. morphine 4 mg IV with improvement of pain Medical decision making suspicion for cardiac etiology of pain is not high . Spoke with Ms. Vedia Coffer Plan admit telemetry IV hydration Diagnosis #1 chest pain #2 renal insufficiency #3 UTI #4 hallucinations  Doug Sou, MD 10/31/11 1707

## 2011-10-31 NOTE — ED Notes (Signed)
Called report to [facility] on 4 west; pt stable at time transfer

## 2011-10-31 NOTE — H&P (Signed)
[facility] History and Physical  Rhonda [name] NGE:[id number] DOB: 27-Sep-1930 DOA: 10/31/2011  Referring physician: Doug Sou PCP: Elijio Miles, MD   Chief Complaint:  Acute renal failure  HPI:  This is a Rhonda [name] is 78 year old Caucasian female with past medical history of hypertension, anemia and bladder cancer status post ileal conduit in 1988. Patient presented to the emergency department complaining about insomnia. Patient reported that she did not stay for the past 21 nights, she tried various sleep aids techniques and medications but it did not help. Upon initial evaluation in the emergency department patient was found to have UTI, acute renal failure. She was given Rocephin and ACT team was consulted for behavioral health hospital admission but while she was in the emergency department she was complaining about chest pain, chest pain is substernal, lasts for 30 minutes, did not remember anything increases or decreases the pain, did not radiate. So [facility] was asked to admit for all these problems.  Review of Systems:  Constitutional: Denies any recent weight loss Eyes: negative for irritation, redness and visual disturbance  Ears, nose, mouth, throat, and face: negative for earaches, epistaxis, nasal congestion and sore throat  Respiratory: negative for cough, dyspnea on exertion, sputum and wheezing  Cardiovascular: negative for chest pain, dyspnea, lower extremity edema, orthopnea, palpitations and syncope  Gastrointestinal: negative for abdominal pain, constipation, diarrhea, melena, nausea and vomiting  Genitourinary:negative for dysuria, frequency and hematuria  Hematologic/lymphatic: negative for bleeding, easy bruising and lymphadenopathy  Musculoskeletal:negative for arthralgias, muscle weakness and stiff joints  Neurological: negative for coordination problems, gait problems, headaches and weakness  Endocrine: negative for diabetic symptoms including  polydipsia, polyuria and weight loss  Allergic/Immunologic: negative for anaphylaxis, hay fever and urticaria   Past Medical History  Diagnosis Date  . Hypertension   . Anemia   . Arthritis   . Pneumonia   . Goiter   . Schizo affective schizophrenia    Past Surgical History  Procedure Date  . Abdominal hysterectomy   . Back surgery   . Ileostomy   . Hernia repair   . Cholecystectomy    Social History:  reports that she has never smoked. She has never used smokeless tobacco. She reports that she does not drink alcohol or use illicit drugs.  Allergies  Allergen Reactions  . Macrodantin (Nitrofurantoin Macrocrystal) Other (See Comments)    "Makes her fall"  . Septra (Sulfamethoxazole W-Trimethoprim) Other (See Comments)    "Makes her fall"  . Soma (Carisoprodol) Other (See Comments)    "Makes her fall"    History reviewed. No pertinent family history.   Prior to Admission medications   Medication Sig Start Date End Date Taking? Authorizing Provider  morphine (MS CONTIN) 15 MG 12 hr tablet Take 15 mg by mouth every 8 (eight) hours.   Yes Historical Provider, MD   Physical Exam: Filed Vitals:   10/31/11 0435 10/31/11 0436 10/31/11 1356 10/31/11 1440  BP:  142/74 127/59 155/75  Pulse:  74 68 76  Temp:  98 F (36.7 C) 97.5 F (36.4 C)   TempSrc:  Oral Oral   Resp:  16 18 24   Weight: 49.896 kg (110 lb)     SpO2:  100% 98% 98%   General appearance: alert, cooperative and no distress  Head: Normocephalic, without obvious abnormality, atraumatic  Eyes: conjunctivae/corneas clear. PERRL, EOM's intact. Fundi benign.  Nose: Nares normal. Septum midline. Mucosa normal. No drainage or sinus tenderness.  Throat: lips, mucosa, and tongue normal; teeth  and gums normal  Neck: Supple, no masses, no cervical lymphadenopathy, no JVD appreciated, no meningeal signs  Resp: clear to auscultation bilaterally  Chest wall: no tenderness  Cardio: regular rate and rhythm, S1, S2 normal,  no murmur, click, rub or gallop  GI: soft, non-tender; bowel sounds normal; no masses, no organomegaly  Extremities: extremities normal, atraumatic, no cyanosis or edema  Skin: Skin color, texture, turgor normal. No rashes or lesions  Neurologic: Alert and oriented X 3, but she still cannot remember a lot of things, gait was not tested no weakness.   Labs on Admission:  Basic Metabolic Panel:  Lab 10/31/11 9604  NA 144  K 3.2*  CL 115*  CO2 --  GLUCOSE 92  BUN 13  CREATININE 2.00*  CALCIUM --  MG --  PHOS --   Liver Function Tests: No results found for this basename: AST:5,ALT:5,ALKPHOS:5,BILITOT:5,PROT:5,ALBUMIN:5 in the last 168 hours No results found for this basename: LIPASE:5,AMYLASE:5 in the last 168 hours No results found for this basename: AMMONIA:5 in the last 168 hours CBC:  Lab 10/31/11 0533 10/31/11 0525  WBC -- 3.5*  NEUTROABS -- 1.9  HGB 12.2 11.2*  HCT 36.0 34.5*  MCV -- 89.8  PLT -- 146*   Cardiac Enzymes:  Lab 10/31/11 1705  CKTOTAL 36  CKMB 2.6  CKMBINDEX --  TROPONINI <0.30    BNP (last 3 results) No results found for this basename: PROBNP:3 in the last 8760 hours CBG: No results found for this basename: GLUCAP:5 in the last 168 hours  Radiological Exams on Admission: Ct Head Wo Contrast  10/31/2011  *RADIOLOGY REPORT*  Clinical Data: Altered mental status  CT HEAD WITHOUT CONTRAST  Technique:  Contiguous axial images were obtained from the base of the skull through the vertex without contrast.  Comparison: 01/27/2011  Findings: Prominence of the sulci, cisterns, and ventricles, in keeping with volume loss. There are subcortical and periventricular white matter hypodensities, a nonspecific finding most often seen with chronic microangiopathic changes.  There is no evidence for acute hemorrhage, overt hydrocephalus, mass lesion, or abnormal extra-axial fluid collection.  No definite CT evidence for acute cortical based (large artery) infarction.  The visualized paranasal sinuses and mastoid air cells are predominately clear.  IMPRESSION: Volume loss and white matter changes as above.  No definite acute intracranial abnormality.  Original Report Authenticated By: Waneta Martins, M.D.   Dg Chest Port 1 View  10/31/2011  *RADIOLOGY REPORT*  Clinical Data: Chest pain.  Oral hydration.  PORTABLE CHEST - 1 VIEW  Comparison: 01/27/2011  Findings: Heart size is normal.  No pleural effusion or edema.  Asymmetric elevation of the right hemidiaphragm noted.  No airspace consolidation identified.  IMPRESSION:  1.  No acute cardiopulmonary abnormalities. 2.  Asymmetric elevation of the right hemidiaphragm.  Original Report Authenticated By: Rosealee Albee, M.D.    EKG: Independently reviewed.  Assessment/Plan Principal Problem:  *Chest pain Active Problems:  Anemia  HTN (hypertension)  Acute on chronic renal insufficiency  UTI (lower urinary tract infection)  Chest pain Atypical chest pain, pain lasts for 30 minutes and resolved on its own. We will get 3 sets of cardiac enzymes and 12 EKG, I will repeat a 12-lead EKG in the morning. D. dimers were negative, chest x-ray is nonrevealing too. Give patient aspirin.  Acute renal failure on CKD stage III Patient reported to have chronic kidney disease, but his creatinine of 1.6 from 10/21/2011. There is no records of previous BMP. Creatinine now  is 2.00, I think this is related to dehydration from agitation. I will hydrate with IV fluids. Check BMP in the morning.  UTI Complicated UTI because of altered urinary tract, patient has ileal conduit since 1988 secondary to bladder tumor/resection. Patient received Rocephin earlier today, we'll wait on the culture to adjust antibiotics.  Psychosis Patient reported that she did not sleep for the past 21 nights, ACT team to evaluate for behavioral health admission. Patient probably will be appropriate to go in 1-2 days.   Code Status: Full code    Family Communication: Son Chipley bedside, plan discussed with both.  Disposition Plan: Inpatient for now, ACT team to evaluate and recommend the disposition  Time spent: 60 minutes  [facility] Pager [phone]  If 7PM-7AM, please contact night-coverage [email or web address] Password [facility] 10/31/2011, 6:29 PM

## 2011-10-31 NOTE — ED Notes (Signed)
Patient no complaining of any chest pain or discomfort at this time
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
## 2011-10-31 NOTE — ED Notes (Signed)
Pt c/o SOB, chest tightness, LS ausculated and clear HOB elevated to Semi-Fowler's, Dr. Ethelda Chick informed.

## 2011-10-31 NOTE — ED Notes (Signed)
Pt requesting more pain meds for left arm and back pain, notified dr. Shela Commons; awaiting his bedside visit Attempted to call report to psych ed, awaiting rn to return call

## 2011-10-31 NOTE — ED Notes (Signed)
Attempted to call report to tcu, awaiting rn to return call

## 2011-10-31 NOTE — ED Notes (Signed)
Family at bedside. 

## 2011-10-31 NOTE — ED Provider Notes (Signed)
Pt pleasant.  In no distress.  Presents with insomnia and delusions.  History of psychiatric illness.  Will check labs.  Plan on psychiatric evaluation.  Doubt organic cause.  Celene Kras, MD 10/31/11 [phone]

## 2011-11-01 LAB — COMPREHENSIVE METABOLIC PANEL
ALT: 6 U/L (ref 0–35)
Alkaline Phosphatase: 60 U/L (ref 39–117)
BUN: 11 mg/dL (ref 6–23)
CO2: 16 mEq/L — ABNORMAL LOW (ref 19–32)
Chloride: 117 mEq/L — ABNORMAL HIGH (ref 96–112)
GFR calc Af Amer: 31 mL/min — ABNORMAL LOW (ref >90)
GFR calc non Af Amer: 27 mL/min — ABNORMAL LOW (ref >90)
Glucose, Bld: 89 mg/dL (ref 70–99)
Potassium: 4.5 mEq/L (ref 3.5–5.1)
Sodium: 141 mEq/L (ref 135–145)
Total Bilirubin: 0.3 mg/dL (ref 0.3–1.2)

## 2011-11-01 LAB — CARDIAC PANEL(CRET KIN+CKTOT+MB+TROPI)
CK, MB: 2.7 ng/mL (ref 0.3–4.0)
Relative Index: INVALID (ref 0.0–2.5)
Total CK: 38 U/L (ref 7–177)
Troponin I: <0.3 ng/mL (ref <0.30)

## 2011-11-01 LAB — CBC
Hemoglobin: 10.3 g/dL — ABNORMAL LOW (ref 12.0–15.0)
MCH: 29 pg (ref 26.0–34.0)
MCHC: 31.7 g/dL (ref 30.0–36.0)
MCV: 91.5 fL (ref 78.0–100.0)
RBC: 3.55 MIL/uL — ABNORMAL LOW (ref 3.87–5.11)

## 2011-11-01 LAB — URINE CULTURE: Colony Count: 85000

## 2011-11-01 MED ORDER — LORAZEPAM 1 MG PO TABS
1.0000 mg | ORAL_TABLET | Freq: Once | ORAL | Status: AC
  Administered 2011-11-01: 1 mg via ORAL
  Filled 2011-11-01: qty 1

## 2011-11-01 MED ORDER — SENNA 8.6 MG PO TABS
2.0000 | ORAL_TABLET | Freq: Once | ORAL | Status: AC
  Administered 2011-11-01: 17.2 mg via ORAL
  Filled 2011-11-01: qty 1

## 2011-11-01 NOTE — Evaluation (Signed)
Physical Therapy One-Time Evaluation Patient Details Name: Rhonda [name] MRN: [id number] DOB: Aug 20, 1930 Today's Date: 11/01/2011 Time: 4098-1191 PT Time Calculation (min): 8 min  PT Assessment / Plan / Recommendation Clinical Impression  Pt presents with chest pain with history of schizophrenia, bladder tumor and acute renal failure.  Pt reluctantly agreed to take some steps from bed to chair, however refused to use a RW for safety and also states that she does not take physical therapy and will never take physical therapy.  "I will get up on my own terms."  Pt strongly encouraged pt/family for pt to participate in therapy while in hospital, however she continued to refuse.  PT recommends she continue PT in acute venue to address deficits, but again she refused, therefore PT will sign off on pt at this time.     PT Assessment  Patent does not need any further PT services (Pt is refusing at this time. )    Follow Up Recommendations       Barriers to Discharge        Equipment Recommendations       Recommendations for Other Services     Frequency      Precautions / Restrictions Precautions Precautions: Fall Restrictions Weight Bearing Restrictions: No   Pertinent Vitals/Pain 7/10      Mobility  Bed Mobility Bed Mobility: Supine to Sit Supine to Sit: 5: Supervision Details for Bed Mobility Assistance: Supervision for safety with cues for safety and technique.  Transfers Transfers: Sit to Stand;Stand to [employer] Transfers Sit to Stand: 4: Min assist;With upper extremity assist;From bed Stand to Sit: 4: Min assist;With upper extremity assist;With armrests;To chair/3-in-1 Stand Pivot Transfers: 1: +2 Total assist Stand Pivot Transfers: Patient Percentage: 70% Details for Transfer Assistance: Min assist to rise and steady.  Pt refused to use RW to transfer from bed to chair and only wanted hand held assist.  Refused to ambulate in room.   Ambulation/Gait Ambulation/Gait  Assistance: Not tested (comment) Stairs: No Wheelchair Mobility Wheelchair Mobility: No    Exercises     PT Diagnosis:    PT Problem List:   PT Treatment Interventions:     PT Goals    Visit Information  Last PT Received On: 11/01/11 Assistance Needed: +1    Subjective Data  Subjective: I don't want physical therapy and I don't need physical therapy Patient Stated Goal: to get some sleep   Prior Functioning  Home Living Lives With: Son;Spouse Available Help at Discharge: Family Type of Home: House Home Adaptive Equipment: Walker - rolling Prior Function Level of Independence: Independent with assistive device(s) Communication Communication: No difficulties    Cognition  Overall Cognitive Status: Appears within functional limits for tasks assessed/performed Arousal/Alertness: Awake/alert Orientation Level: Appears intact for tasks assessed Behavior During Session: [facility] for tasks performed    Extremity/Trunk Assessment Right Lower Extremity Assessment RLE ROM/Strength/Tone: Deficits RLE ROM/Strength/Tone Deficits: generalized weakness, but at least 3/5 per functional assessment RLE Coordination: WFL - gross motor Left Lower Extremity Assessment LLE ROM/Strength/Tone: Deficits LLE ROM/Strength/Tone Deficits: generalized weakness, but at least 3/5 per functional assessment LLE Coordination: WFL - gross motor Trunk Assessment Trunk Assessment: Normal   Balance    End of Session PT - End of Session Activity Tolerance: Patient limited by pain Patient left: in chair;with call bell/phone within reach;with family/visitor present  GP     Page, Meribeth Mattes 11/01/2011, 4:04 PM

## 2011-11-01 NOTE — Progress Notes (Signed)
Patient and her son wondering when psych will be here to evaluate her.  She reported that psych was supposed to be coming in to help with some medications that may help her to sleep.  Patient reported that she hasn't slept in 21 days.  Dr. Arthor Captain was called and reported that he had contacted psych this morning around 0930.  RN called [phone] to make sure that psych was coming to see the patient.  RN was told that psych had up to 24 hours to see the patient from the time of the consult.  Patient notified of this.

## 2011-11-01 NOTE — Progress Notes (Signed)
INITIAL ADULT NUTRITION ASSESSMENT Date: 11/01/2011   Time: 11:54 AM  Reason for Assessment: Nutrition risk, unintentional weight loss  INTERVENTION: Magic cups BID on lunch and dinner trays.  ASSESSMENT: Female 78 y.o.  Dx: Chest pain  Hx:  Past Medical History  Diagnosis Date  . Hypertension   . Anemia   . Arthritis   . Pneumonia   . Goiter   . Schizo affective schizophrenia     Related Meds:     . sodium chloride   Intravenous Once  . sodium chloride   Intravenous STAT  . aspirin  324 mg Oral Once  . aspirin EC  325 mg Oral Daily  . cefTRIAXone (ROCEPHIN)  IV  1 g Intravenous Q24H  . heparin  5,000 Units Subcutaneous Q8H  . LORazepam  1 mg Oral Once  . morphine  15 mg Oral Q8H  .  morphine injection  4 mg Intravenous Once  .  morphine injection  4 mg Intravenous Once  . sodium chloride  3 mL Intravenous Q12H  . DISCONTD: aspirin EC  325 mg Oral Daily  . DISCONTD: potassium chloride  40 mEq Oral Q4H    Ht: 5\' 6"  (167.6 cm)  Wt: 124 lb 8 oz (56.473 kg) (standing scale)  Ideal Wt: 59.3 kg % Ideal Wt: 96%  Usual Wt: Unknown % Usual Wt: Unknown  Body mass index is 20.09 kg/(m^2).  Food/Nutrition Related Hx: Patient reports a poor appetite over the last few weeks. She also reports poor appetite due to chronic back pain  Labs:  CMP     Component Value Date/Time   NA 141 11/01/2011 0950   K 4.5 11/01/2011 0950   CL 117* 11/01/2011 0950   CO2 16* 11/01/2011 0950   GLUCOSE 89 11/01/2011 0950   BUN 11 11/01/2011 0950   CREATININE 1.75* 11/01/2011 0950   CALCIUM 9.5 11/01/2011 0950   PROT 5.8* 11/01/2011 0950   ALBUMIN 3.3* 11/01/2011 0950   AST 15 11/01/2011 0950   ALT 6 11/01/2011 0950   ALKPHOS 60 11/01/2011 0950   BILITOT 0.3 11/01/2011 0950   GFRNONAA 27* 11/01/2011 0950   GFRAA 31* 11/01/2011 0950    Intake/Output Summary (Last 24 hours) at 11/01/11 1155 Last data filed at 11/01/11 1026  Gross per 24 hour  Intake 1692.08 ml  Output   1800 ml  Net  -107.92 ml    Diet Order: Regular, 75%  Supplements/Tube Feeding: None  IVF:    sodium chloride Last Rate: 125 mL/hr at 11/01/11 0730  DISCONTD: sodium chloride Last Rate: 125 mL/hr at 10/31/11 1655    Estimated Nutritional Needs:   Kcal: 1300-1400 kcal Protein: 70-80 g Fluid: 1.7 L  Patient admitted with chest pain. She reports a poor appetite for several week, but this has improved since admission. She states her appetite is generally good except when she has back pain. She does report some weight loss, but cannot specify how much. She does not like Ensure or Boost.  NUTRITION DIAGNOSIS: -Predicted suboptimal energy intake (NI-1.6).  Status: Ongoing  RELATED TO: chronic back pain  AS EVIDENCE BY: history of decreased intake with onset of back pain  MONITORING/EVALUATION(Goals): Patient will meet 90-100% of estimated nutrition needs.  Monitor: PO intake, weight, labs  EDUCATION NEEDS: -No education needs identified at this time   DOCUMENTATION CODES Per approved criteria  -Not Applicable    Fabio Pierce 11/01/2011, 11:54 AM
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
## 2011-11-02 LAB — BASIC METABOLIC PANEL
CO2: 18 mEq/L — ABNORMAL LOW (ref 19–32)
Chloride: 115 mEq/L — ABNORMAL HIGH (ref 96–112)
Creatinine, Ser: 1.62 mg/dL — ABNORMAL HIGH (ref 0.50–1.10)
Glucose, Bld: 98 mg/dL (ref 70–99)

## 2011-11-02 MED ORDER — MORPHINE SULFATE 2 MG/ML IJ SOLN
1.0000 mg | Freq: Three times a day (TID) | INTRAMUSCULAR | Status: DC | PRN
  Administered 2011-11-02 – 2011-11-03 (×2): 1 mg via INTRAVENOUS
  Filled 2011-11-02 (×2): qty 1

## 2011-11-02 MED ORDER — ZOLPIDEM TARTRATE 10 MG PO TABS
10.0000 mg | ORAL_TABLET | Freq: Every day | ORAL | Status: DC
  Administered 2011-11-02: 10 mg via ORAL
  Filled 2011-11-02: qty 1

## 2011-11-02 MED ORDER — DIPHENHYDRAMINE HCL 50 MG PO CAPS: 50.0000 mg | ORAL_CAPSULE | Freq: Once | ORAL | Status: DC

## 2011-11-02 MED ORDER — DEXTROSE 5 % IV SOLN
1.0000 g | INTRAVENOUS | Status: DC
  Administered 2011-11-02: 1 g via INTRAVENOUS
  Filled 2011-11-02 (×2): qty 10

## 2011-11-02 MED ORDER — SODIUM CHLORIDE 0.9 % IV SOLN
INTRAVENOUS | Status: AC
  Administered 2011-11-02 (×2): via INTRAVENOUS
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
## 2011-11-02 NOTE — Progress Notes (Signed)
Chart review.  Spoke with psych MD.  Psych MD stating that Pt doesn't need any psych CSW needs, at this time.  Please call psych CSW at the number below if there are any needs that I can help address.  Providence Crosby, LCSWA Clinical Social Work [phone]

## 2011-11-03 DIAGNOSIS — F259 Schizoaffective disorder, unspecified: Secondary | ICD-10-CM

## 2011-11-03 DIAGNOSIS — F22 Delusional disorders: Secondary | ICD-10-CM

## 2011-11-03 LAB — CBC
HCT: 29.7 % — ABNORMAL LOW (ref 36.0–46.0)
Hemoglobin: 9.3 g/dL — ABNORMAL LOW (ref 12.0–15.0)
MCH: 29 pg (ref 26.0–34.0)
MCV: 92.5 fL (ref 78.0–100.0)
Platelets: 110 10*3/uL — ABNORMAL LOW (ref 150–400)
RBC: 3.21 MIL/uL — ABNORMAL LOW (ref 3.87–5.11)

## 2011-11-03 LAB — BASIC METABOLIC PANEL
CO2: 20 mEq/L (ref 19–32)
Calcium: 9.3 mg/dL (ref 8.4–10.5)
Chloride: 113 mEq/L — ABNORMAL HIGH (ref 96–112)
Glucose, Bld: 88 mg/dL (ref 70–99)
Sodium: 138 mEq/L (ref 135–145)

## 2011-11-03 MED ORDER — MORPHINE SULFATE ER 15 MG PO TBCR: 15.0000 mg | EXTENDED_RELEASE_TABLET | Freq: Three times a day (TID) | ORAL | Status: DC

## 2011-11-03 MED ORDER — ZOLPIDEM TARTRATE 10 MG PO TABS: 5.0000 mg | ORAL_TABLET | Freq: Every day | ORAL | Status: DC

## 2011-11-03 MED ORDER — CIPROFLOXACIN HCL 500 MG PO TABS: 500.0000 mg | ORAL_TABLET | Freq: Two times a day (BID) | ORAL | Status: AC
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
## 2011-11-03 NOTE — Evaluation (Signed)
Occupational Therapy Evaluation (late entry for 7/31) Patient Details Name: Rhonda [name] MRN: [id number] DOB: 09/27/1930 Today's Date: 11/03/2011 Time: 4098-1191 OT Time Calculation (min): 19 min  OT Assessment / Plan / Recommendation Clinical Impression  Pt is a 78 yo schizophrenic female who presents with CP, CKD and UTI. Pt and family refusing any further therapy at home or at hospital. Feel pt would benefit would benefit from [facility] f/u at home. Pt continuing to adamantly refuse. Will sign off for now. Please re-order if pt and family become agreeable.    OT Assessment       Follow Up Recommendations  Supervision/Assistance - 24 hour    Barriers to Discharge      Equipment Recommendations  None recommended by OT    Recommendations for Other Services    Frequency       Precautions / Restrictions Precautions Precautions: Fall   Pertinent Vitals/Pain     ADL  Grooming: Set up;Simulated Where Assessed - Grooming: Unsupported sitting Upper Body Bathing: Simulated;Set up Where Assessed - Upper Body Bathing: Unsupported sitting Lower Body Bathing: Simulated;Minimal assistance Where Assessed - Lower Body Bathing: Supported sit to stand Upper Body Dressing: Simulated Where Assessed - Upper Body Dressing: Unsupported sitting Lower Body Dressing: Simulated;Minimal assistance Where Assessed - Lower Body Dressing: Supported sit to stand Toilet Transfer: Simulated;Minimal assistance Toilet Transfer Method: Stand pivot Acupuncturist: Other (comment) Nurse, children's) Toileting - Clothing Manipulation and Hygiene: Simulated;Minimal assistance Where Assessed - Toileting Clothing Manipulation and Hygiene: Standing Transfers/Ambulation Related to ADLs: Pt only agreeable to stand pivot transfer to chair. Pt and family adamantly refusing any f/u therapy. Pt slightly unsteady using hand held assist to pivot to chair. Family states she has a RW and scooter at home for when she has to go  out into the community.    OT Diagnosis:    OT Problem List:   OT Treatment Interventions:     OT Goals    Visit Information  Last OT Received On: 10/03/11 Assistance Needed: +1    Subjective Data  Subjective: I get along just fine at home. Patient Stated Goal: Not asked   Prior Functioning  Vision/Perception  Home Living Lives With: Son;Spouse Available Help at Discharge: Family Type of Home: House Home Access: Stairs to enter [employer] of Steps: 4 Entrance Stairs-Rails: Right;Left;Can reach both Home Layout: One level Bathroom Shower/Tub: Engineer, manufacturing systems: Standard Home Adaptive Equipment: Walker - rolling;Other (comment) Prior Function Level of Independence: Needs assistance Needs Assistance: Meal Prep;Light Housekeeping Meal Prep: Total Light Housekeeping: Total Able to Take Stairs?: Yes Driving: Yes Vocation: Retired Musician: No difficulties Dominant Hand: Right      Cognition  Overall Cognitive Status: Impaired Area of Impairment: Awareness of deficits Arousal/Alertness: Awake/alert Orientation Level: Appears intact for tasks assessed Behavior During Session: Flat affect Awareness of Deficits: Pt lacks insight into limitations.    Extremity/Trunk Assessment Right Upper Extremity Assessment RUE ROM/Strength/Tone: WFL for tasks assessed Left Upper Extremity Assessment LUE ROM/Strength/Tone: WFL for tasks assessed   Mobility Bed Mobility Supine to Sit: 5: Supervision;HOB flat;With rails Details for Bed Mobility Assistance: min VCs for sequencing and technique. Transfers Sit to Stand: 4: Min assist;With upper extremity assist;From bed Stand to Sit: 4: Min guard;With upper extremity assist;To chair/3-in-1 Details for Transfer Assistance: Pt adamantly declined to ambulate to the bathroom and claims "she does not need to use an assistive device." Family states they do the cooking and the cleaning.   Exercise  Balance    End of Session OT - End of Session Activity Tolerance: Patient tolerated treatment well Patient left: in chair;with call bell/phone within reach;with family/visitor present  GO     Orvil Faraone A OTR/L [phone] 11/03/2011, 10:46 AM

## 2011-11-03 NOTE — Consult Note (Signed)
Patient Identification:  Rhonda [name] Date of Evaluation:  11/03/2011 Reason for Consult: Psychosis Referring Provider: Dr. Thedore Mins History of Present Illness: Patient is sleeping but wakes readily and is alert. She asked her husband and son to leave the room. She states that she has had bladder cancer and has hypertension and chronic pain. She has been admitted for renal failure and complaint of chest pain. She also emphasizes she has not had any sleep for the past 3 weeks. She just awoke and said it was the most peaceful sleep she has had for some time.    Past Psychiatric History: She explains that she is with the Creed  she had taken a nap and asked psychotic for the Creed some years ago and it made her feel so parable but she stopped it. She says the Creed gives her injections and stops her back pain.   Past Medical History:     Past Medical History  Diagnosis Date  . Hypertension   . Anemia   . Arthritis   . Pneumonia   . Goiter   . Schizo affective schizophrenia        Past Surgical History  Procedure Date  . Abdominal hysterectomy   . Back surgery   . Ileostomy   . Hernia repair   . Cholecystectomy     Allergies:  Allergies  Allergen Reactions  . Macrodantin (Nitrofurantoin Macrocrystal) Other (See Comments)    "Makes her fall"  . Septra (Sulfamethoxazole W-Trimethoprim) Other (See Comments)    "Makes her fall"  . Soma (Carisoprodol) Other (See Comments)    "Makes her fall"    Current Medications:  Prior to Admission medications   Medication Sig Start Date End Date Taking? Authorizing Provider  morphine (MS CONTIN) 15 MG 12 hr tablet Take 15 mg by mouth every 8 (eight) hours.   Yes Historical Provider, MD    Social History:    reports that she has never smoked. She has never used smokeless tobacco. She reports that she does not drink alcohol or use illicit drugs.   Family History:    Family History  Problem Relation Age of Onset  . Alzheimer's disease Mother      Mental Status Examination/Evaluation: Objective:  Appearance: Well Groomed  Psychomotor Activity:  Normal  Eye Contact::  Minimal  Speech:  Clear and Coherent  Volume:  Normal  Mood:  Anxious and Dysphoric  Affect:  Appropriate and Restricted  Thought Process:  Relevant and Pain related  Orientation:  Full  Thought Content:  Delusions  Suicidal Thoughts:  No  Homicidal Thoughts:  No  Judgement:  Impaired  Insight:  Lacking    DIAGNOSIS:   AXIS I   delusional disorder, chronic pain chronic Sonia   AXIS II  deferred   AXIS III See medical notes.  AXIS IV other psychosocial or environmental problems, problems related to social environment and Back pain status post surgeries.  AXIS V 51-60 moderate symptoms   Assessment/Plan:Discussed with Dr. Thedore Mins in the morning of [address]109 This patient has been married for more than 30 years. They married before he was sent to [place] in the [employer] there he after, she join him there she developed back pain and is unclear whether she had surgery there or in the [place] but she was told that she had vertebral problems surgery was performed. She said that she had to have a second corrective surgery on her back. Since then she has had chronic pain and she never wanted  to dictate pain pills.  She said the doctor just put them in front of her and she took them for a while and then she decided that the Creed would help her instead of pain pills. She explains the Creed R. peoples voices she can hear them they does tell her when she has pain, they give her injections that relieve the pain and most recently today, she says the Fabian November is gone. She engages in a mental status exam. She knows the date she has a related name for Pres. know, but knows who who she means. She interprets the proverb concretely. When asked to do the calculations she can perform the serial threes. When she is asked to recall 3 of 3 objects she can only recall one and becomes very frustrated and  agitated and demands that she has finished with this and does not want to talk about it anymore. Her frustration is acknowledged and she is calmer. She continues to explain briefly that she can hear the voices of little people out of the Creed. They have relieved her back pain he have left     Her husband and son are briefly interviewed and the waiting room.  They're the only family present. Her son has a very high pitched voice and says that he lives on his own but goes over to his parents home every night to cook dinner and have dinner with them. We both agreed that the patient has had chronic insomnia and they have used a variety of over-the-counter sleep medications that haven't been entirely successful. They know the patient talks about the Creed but they don't understand it and agrees discussion of delusions is offered.    Such delusions persistent, consistent and not reality based.  Therapy and medications rarely make any change in the basis for this delusion or the fixed ideas about it. RECOMMENDATION:  1.  suggest Ambien 10 mg at bedtime for insomnia. 2.  No prescription is suggested for her delusions. 3.  Delusions at this time I do not appear to be interfering with medical care. She has been on pain medication for a considerable length of time and if her delusions work in place of opiates or others scheduled pain medications that would replace a potential addiction to a simple solution. 4   outpatient therapy is not recommended. 5.  Husband and son are encouraged to avoid arguing or debating regarding this delusion. 6   no further psychiatric needs identified. M.D. psychiatrist signs off. Curt Oatis J. Ferol Luz, MD Psychiatrist  11/03/2011 7:39 AM

## 2011-11-03 NOTE — Progress Notes (Signed)
PHYSICAL THERAPY SCREEN.-- PT had evaluated pt 11/01/11 but limited due to pt. Participation. Today pt. Consented to get up, walked 30 ft. W/ RW  Min guard. Pt. Reports going home. No further PT indicated.Santa [place] PT [phone]

## 2011-11-03 NOTE — ED Provider Notes (Signed)
Medical screening examination/treatment/procedure(s) were conducted as a shared visit with non-physician practitioner(s) and myself.  I personally evaluated the patient during the encounter   Celene Kras, MD 11/03/11 [phone]

## 2011-11-04 ENCOUNTER — Emergency Department (HOSPITAL_COMMUNITY)
Admission: EM | Admit: 2011-11-04 | Discharge: 2011-11-04 | Disposition: A | Discharge status: Home / Self Care | Payer: Medicare Other | Attending: Emergency Medicine | Admitting: Emergency Medicine

## 2011-11-04 ENCOUNTER — Encounter (HOSPITAL_COMMUNITY): Payer: Self-pay | Admitting: Emergency Medicine

## 2011-11-04 ENCOUNTER — Emergency Department (HOSPITAL_COMMUNITY): Payer: Medicare Other

## 2011-11-04 DIAGNOSIS — M5126 Other intervertebral disc displacement, lumbar region: Secondary | ICD-10-CM

## 2011-11-04 DIAGNOSIS — Z8739 Personal history of other diseases of the musculoskeletal system and connective tissue: Secondary | ICD-10-CM | POA: Insufficient documentation

## 2011-11-04 DIAGNOSIS — Z8659 Personal history of other mental and behavioral disorders: Secondary | ICD-10-CM | POA: Insufficient documentation

## 2011-11-04 DIAGNOSIS — I10 Essential (primary) hypertension: Secondary | ICD-10-CM | POA: Insufficient documentation

## 2011-11-04 DIAGNOSIS — Z9089 Acquired absence of other organs: Secondary | ICD-10-CM | POA: Insufficient documentation

## 2011-11-04 LAB — URINALYSIS, ROUTINE W REFLEX MICROSCOPIC
Bilirubin Urine: NEGATIVE
Glucose, UA: NEGATIVE mg/dL
Ketones, ur: NEGATIVE mg/dL
pH: 6.5 (ref 5.0–8.0)

## 2011-11-04 LAB — CBC WITH DIFFERENTIAL/PLATELET
Basophils Relative: 0 % (ref 0–1)
Eosinophils Absolute: 0.1 10*3/uL (ref 0.0–0.7)
Eosinophils Relative: 5 % (ref 0–5)
HCT: 32 % — ABNORMAL LOW (ref 36.0–46.0)
Hemoglobin: 10.2 g/dL — ABNORMAL LOW (ref 12.0–15.0)
MCH: 29.2 pg (ref 26.0–34.0)
MCHC: 31.9 g/dL (ref 30.0–36.0)
MCV: 91.7 fL (ref 78.0–100.0)
Monocytes Absolute: 0.3 10*3/uL (ref 0.1–1.0)
Neutro Abs: 1.4 10*3/uL — ABNORMAL LOW (ref 1.7–7.7)
RBC: 3.49 MIL/uL — ABNORMAL LOW (ref 3.87–5.11)

## 2011-11-04 LAB — URINE MICROSCOPIC-ADD ON

## 2011-11-04 LAB — BASIC METABOLIC PANEL
BUN: 10 mg/dL (ref 6–23)
Chloride: 108 mEq/L (ref 96–112)
Creatinine, Ser: 1.65 mg/dL — ABNORMAL HIGH (ref 0.50–1.10)
GFR calc Af Amer: 33 mL/min — ABNORMAL LOW (ref >90)
GFR calc non Af Amer: 28 mL/min — ABNORMAL LOW (ref >90)
Glucose, Bld: 88 mg/dL (ref 70–99)

## 2011-11-04 MED ORDER — PREDNISONE 10 MG PO TABS: 10.0000 mg | ORAL_TABLET | Freq: Every day | ORAL | Status: AC

## 2011-11-04 MED ORDER — OXYCODONE-ACETAMINOPHEN 5-325 MG PO TABS
1.0000 | ORAL_TABLET | Freq: Once | ORAL | Status: AC
Start: 2011-11-04 — End: 2011-11-04
  Administered 2011-11-04: 1 via ORAL
  Filled 2011-11-04: qty 1

## 2011-11-04 NOTE — ED Notes (Signed)
Patient transported to MRI. Maralyn Sago here to take pt to MRI

## 2011-11-04 NOTE — ED Notes (Addendum)
Pt c/o bilateral leg and low back pain for a week.  Pt denies fall or any other injury. Pt seen yesterday for same, states prescriptions are not working.

## 2011-11-04 NOTE — ED Provider Notes (Signed)
I saw and evaluated the patient, reviewed the resident's note and I agree with the findings and plan.  Cheri Guppy, MD 11/04/11 [phone]
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
## 2011-11-04 NOTE — ED Provider Notes (Signed)
I saw and evaluated the patient, reviewed the resident's note and I agree with the findings and plan. Has hx of back surgery.  C/o back pain with radiation into bilat legs all the way into her feet.  C/o weakness in legs.  No fever, incontinence, perineal numbness.  No recent hx of trauma. No hx of cancer. pe strength normal in ext.  + SLR bilat. Toes upgoing bilat on plantar flexion.  Will give meds and get mri.  Cheri Guppy, MD 11/04/11 [phone]

## 2011-11-04 NOTE — ED Provider Notes (Signed)
History     CSN: [id number]  Arrival date & time 11/04/11  1338   First MD Initiated Contact with Patient 11/04/11 1401      Chief Complaint  Patient presents with  . Leg Pain  . Back Pain    (Consider location/radiation/quality/duration/timing/severity/associated sxs/prior treatment) Patient is a 78 y.o. female presenting with back pain. The history is provided by the patient.  Back Pain  This is a chronic problem. The current episode started more than 1 week ago. The problem occurs constantly. The problem has been rapidly worsening. The pain is associated with no known injury. The pain is present in the lumbar spine. The quality of the pain is described as shooting. The pain radiates to the left thigh, left knee, left foot, right thigh, right knee and right foot. The pain is severe. The symptoms are aggravated by certain positions. Associated symptoms include numbness, leg pain, paresthesias and weakness. Pertinent negatives include no chest pain, no fever, no perianal numbness, no bladder incontinence, no dysuria and no pelvic pain. Associated symptoms comments: Ileostomy . She has tried analgesics for the symptoms.    Past Medical History  Diagnosis Date  . Hypertension   . Anemia   . Arthritis   . Pneumonia   . Goiter   . Schizo affective schizophrenia     Past Surgical History  Procedure Date  . Abdominal hysterectomy   . Back surgery   . Ileostomy   . Hernia repair   . Cholecystectomy     Family History  Problem Relation Age of Onset  . Alzheimer's disease Mother     History  Substance Use Topics  . Smoking status: Never Smoker   . Smokeless tobacco: Never Used  . Alcohol Use: No    OB History    Grav Para Term Preterm Abortions TAB SAB Ect Mult Living                  Review of Systems  Constitutional: Negative for fever.  Cardiovascular: Negative for chest pain.  Genitourinary: Negative for bladder incontinence, dysuria and pelvic pain.    Musculoskeletal: Positive for back pain.  Neurological: Positive for weakness, numbness and paresthesias.    Allergies  Macrodantin; Septra; and Soma  Home Medications   Current Outpatient Rx  Name Route Sig Dispense Refill  . CIPROFLOXACIN HCL 500 MG PO TABS Oral Take 1 tablet (500 mg total) by mouth 2 (two) times daily. 4 tablet 0  . MORPHINE SULFATE ER 15 MG PO TBCR Oral Take 15 mg by mouth every 8 (eight) hours. For pain    . ZOLPIDEM TARTRATE 10 MG PO TABS Oral Take 0.5 tablets (5 mg total) by mouth at bedtime. 30 tablet 0    BP 131/65  Pulse 60  Temp 98.6 F (37 C) (Oral)  SpO2 96%  Physical Exam  HENT:  Head: Normocephalic and atraumatic.  Cardiovascular: Normal rate and regular rhythm.  Exam reveals no gallop and no friction rub.   No murmur heard.      Soft heart sounds   Pulmonary/Chest: Effort normal and breath sounds normal.  Abdominal: Soft. Bowel sounds are normal.  Musculoskeletal:       Pt able to perform straight leg raise against gravity, but limited 2/2 shooting pain to heels. Straight leg test performed on R and L leg notable for ipsilateral shooting pain to the heels.  5/5 strength foot flexion and extension bilaterally.   Neurological: She displays abnormal reflex.  Absent patellar DTR ?Upgoing R toe babinski Decreased sensation over toes of L foot. Intact sensation of lower leg.     ED Course  Procedures (including critical care time)   Labs Reviewed  URINALYSIS, ROUTINE W REFLEX MICROSCOPIC  CBC WITH DIFFERENTIAL  BASIC METABOLIC PANEL  SEDIMENTATION RATE   No results found.   No diagnosis found.    MDM  1. Lumbar back pain  Pt has sx c/w bilateral sciatica. Denies any perianal numbness, but has ileostomy. No urinary incontinence. Has history of chronic lumbar back pain, but 2 week acute worsening of sx. Differential dx includes DJD w nerve root impingement, spinal stenosis, diskitis, epidural abscess. MRI pending. Bronson Curb 11/04/2011 3:01 PM         Bronson Curb, MD 11/04/11 1501

## 2011-11-04 NOTE — ED Notes (Signed)
Pt reports lower back pain and radiating pain to bilateral legs x 1 week. Recently dc'ed from hospital for same. Denies new injury. States taking morphine at home for pain with some relief.

## 2012-03-18 ENCOUNTER — Emergency Department (HOSPITAL_COMMUNITY)
Admission: EM | Admit: 2012-03-18 | Discharge: 2012-03-19 | Discharge status: Against Medical Advice | Payer: Medicare Other | Attending: Emergency Medicine | Admitting: Emergency Medicine

## 2012-03-18 ENCOUNTER — Emergency Department (HOSPITAL_COMMUNITY): Payer: Medicare Other

## 2012-03-18 ENCOUNTER — Encounter (HOSPITAL_COMMUNITY): Payer: Self-pay | Admitting: *Deleted

## 2012-03-18 DIAGNOSIS — M545 Low back pain, unspecified: Secondary | ICD-10-CM | POA: Insufficient documentation

## 2012-03-18 DIAGNOSIS — Z79899 Other long term (current) drug therapy: Secondary | ICD-10-CM | POA: Insufficient documentation

## 2012-03-18 DIAGNOSIS — I252 Old myocardial infarction: Secondary | ICD-10-CM | POA: Insufficient documentation

## 2012-03-18 DIAGNOSIS — M129 Arthropathy, unspecified: Secondary | ICD-10-CM | POA: Insufficient documentation

## 2012-03-18 DIAGNOSIS — R440 Auditory hallucinations: Secondary | ICD-10-CM

## 2012-03-18 DIAGNOSIS — I10 Essential (primary) hypertension: Secondary | ICD-10-CM | POA: Insufficient documentation

## 2012-03-18 DIAGNOSIS — Z8701 Personal history of pneumonia (recurrent): Secondary | ICD-10-CM | POA: Insufficient documentation

## 2012-03-18 DIAGNOSIS — Z8639 Personal history of other endocrine, nutritional and metabolic disease: Secondary | ICD-10-CM | POA: Insufficient documentation

## 2012-03-18 DIAGNOSIS — E876 Hypokalemia: Secondary | ICD-10-CM | POA: Insufficient documentation

## 2012-03-18 DIAGNOSIS — R443 Hallucinations, unspecified: Secondary | ICD-10-CM | POA: Insufficient documentation

## 2012-03-18 DIAGNOSIS — M549 Dorsalgia, unspecified: Secondary | ICD-10-CM

## 2012-03-18 DIAGNOSIS — Z8659 Personal history of other mental and behavioral disorders: Secondary | ICD-10-CM | POA: Insufficient documentation

## 2012-03-18 DIAGNOSIS — Z862 Personal history of diseases of the blood and blood-forming organs and certain disorders involving the immune mechanism: Secondary | ICD-10-CM | POA: Insufficient documentation

## 2012-03-18 DIAGNOSIS — R5381 Other malaise: Secondary | ICD-10-CM | POA: Insufficient documentation

## 2012-03-18 DIAGNOSIS — R5383 Other fatigue: Secondary | ICD-10-CM | POA: Insufficient documentation

## 2012-03-18 HISTORY — DX: Acute myocardial infarction, unspecified: I21.9

## 2012-03-18 LAB — CBC WITH DIFFERENTIAL/PLATELET
Basophils Absolute: 0 10*3/uL (ref 0.0–0.1)
Basophils Relative: 0 % (ref 0–1)
Eosinophils Absolute: 0 10*3/uL (ref 0.0–0.7)
Eosinophils Relative: 1 % (ref 0–5)
HCT: 36.2 % (ref 36.0–46.0)
Hemoglobin: 12.1 g/dL (ref 12.0–15.0)
MCH: 30.8 pg (ref 26.0–34.0)
MCHC: 33.4 g/dL (ref 30.0–36.0)
MCV: 92.1 fL (ref 78.0–100.0)
Monocytes Absolute: 0.5 10*3/uL (ref 0.1–1.0)
Monocytes Relative: 10 % (ref 3–12)
RDW: 15.6 % — ABNORMAL HIGH (ref 11.5–15.5)

## 2012-03-18 LAB — URINALYSIS, ROUTINE W REFLEX MICROSCOPIC
Glucose, UA: NEGATIVE mg/dL
Protein, ur: 100 mg/dL — AB
Specific Gravity, Urine: 1.016 (ref 1.005–1.030)

## 2012-03-18 LAB — COMPREHENSIVE METABOLIC PANEL
Albumin: 3.9 g/dL (ref 3.5–5.2)
BUN: 22 mg/dL (ref 6–23)
Calcium: 10.2 mg/dL (ref 8.4–10.5)
Chloride: 108 mEq/L (ref 96–112)
Creatinine, Ser: 2.29 mg/dL — ABNORMAL HIGH (ref 0.50–1.10)
Total Bilirubin: 0.6 mg/dL (ref 0.3–1.2)
Total Protein: 6.8 g/dL (ref 6.0–8.3)

## 2012-03-18 LAB — URINE MICROSCOPIC-ADD ON

## 2012-03-18 MED ORDER — MORPHINE SULFATE ER 15 MG PO TBCR
15.0000 mg | EXTENDED_RELEASE_TABLET | Freq: Three times a day (TID) | ORAL | Status: DC
  Administered 2012-03-18 – 2012-03-19 (×2): 15 mg via ORAL
  Filled 2012-03-18 (×2): qty 1

## 2012-03-18 MED ORDER — HALOPERIDOL LACTATE 5 MG/ML IJ SOLN
5.0000 mg | Freq: Four times a day (QID) | INTRAMUSCULAR | Status: DC | PRN
  Filled 2012-03-18: qty 1

## 2012-03-18 MED ORDER — HYDROMORPHONE HCL PF 1 MG/ML IJ SOLN
1.0000 mg | Freq: Once | INTRAMUSCULAR | Status: AC
  Administered 2012-03-18: 1 mg via INTRAVENOUS
  Filled 2012-03-18: qty 1

## 2012-03-18 MED ORDER — POTASSIUM CHLORIDE CRYS ER 20 MEQ PO TBCR
40.0000 meq | EXTENDED_RELEASE_TABLET | Freq: Once | ORAL | Status: AC
  Administered 2012-03-18: 40 meq via ORAL
  Filled 2012-03-18: qty 1

## 2012-03-18 MED ORDER — SODIUM CHLORIDE 0.9 % IV BOLUS (SEPSIS)
1000.0000 mL | Freq: Once | INTRAVENOUS | Status: AC
  Administered 2012-03-18: 1000 mL via INTRAVENOUS

## 2012-03-18 NOTE — ED Provider Notes (Addendum)
History     CSN: [id number]  Arrival date & time 03/18/12  1737   First MD Initiated Contact with Patient 03/18/12 1811      Chief Complaint  Patient presents with  . Back Pain  . Weakness     HPI  The patient presents with back pain and generalized weakness.  She notes a long history of back pain, including prior surgical revisions.  She states that over the past days the pain has become worse, severe, focally in the lower back.  Pain is nonradiating, minimally improved with home morphine.  She denies concurrent abdominal pain, dysuria, hematuria, vomiting she does endorse emesis with nausea.  This has been present for years. Patient is a weakness, generalized without unilateral aspects.  Past Medical History  Diagnosis Date  . Hypertension   . Anemia   . Arthritis   . Pneumonia   . Goiter   . Schizo affective schizophrenia   . Myocardial infarct     Past Surgical History  Procedure Date  . Abdominal hysterectomy   . Back surgery   . Ileostomy   . Hernia repair   . Cholecystectomy     Family History  Problem Relation Age of Onset  . Alzheimer's disease Mother     History  Substance Use Topics  . Smoking status: Never Smoker   . Smokeless tobacco: Never Used  . Alcohol Use: No    OB History    Grav Para Term Preterm Abortions TAB SAB Ect Mult Living                  Review of Systems  Constitutional:       Per HPI, otherwise negative  HENT:       Per HPI, otherwise negative  Eyes: Negative.   Respiratory:       Per HPI, otherwise negative  Cardiovascular:       Per HPI, otherwise negative  Gastrointestinal: Negative for vomiting.  Genitourinary: Negative.   Musculoskeletal:       Per HPI, otherwise negative  Skin: Negative.   Neurological: Positive for weakness. Negative for seizures, syncope and numbness.    Allergies  Macrodantin; Septra; and Soma  Home Medications   Current Outpatient Rx  Name  Route  Sig  Dispense  Refill  .  ASPIRIN-ACETAMINOPHEN-CAFFEINE 250-250-65 MG PO TABS   Oral   Take 2 tablets by mouth every 6 (six) hours as needed. For headache         . MORPHINE SULFATE ER 15 MG PO TBCR   Oral   Take 15 mg by mouth every 8 (eight) hours. For pain         . ADULT MULTIVITAMIN W/MINERALS CH   Oral   Take 1 tablet by mouth daily.           BP 156/74  Pulse 77  Temp 98 F (36.7 C) (Oral)  Resp 22  SpO2 97%  Physical Exam  Nursing note and vitals reviewed. Constitutional: She is oriented to person, place, and time. She appears ill. No distress.  HENT:  Head: Normocephalic and atraumatic.  Eyes: Conjunctivae normal and EOM are normal.  Cardiovascular: Normal rate and regular rhythm.   Pulmonary/Chest: Effort normal and breath sounds normal. No stridor. No respiratory distress.  Abdominal: She exhibits no distension.  Musculoskeletal: She exhibits no edema.       Arms:      Positive straight leg bilaterally, strength is 5/5 in both LE  Neurological:  She is alert and oriented to person, place, and time. No cranial nerve deficit.  Skin: Skin is warm and dry.  Psychiatric: Her mood appears anxious. She is agitated. Thought content is paranoid and delusional. Cognition and memory are impaired.       Patient describes AH, delusional thoughts.    ED Course  Procedures (including critical care time)  Labs Reviewed  CBC WITH DIFFERENTIAL - Abnormal; Notable for the following:    RDW 15.6 (*)     All other components within normal limits  COMPREHENSIVE METABOLIC PANEL  URINALYSIS, ROUTINE W REFLEX MICROSCOPIC   No results found.   No diagnosis found.  O2- 99%ra,normal  When I returned to interview the patient, she began to speak of hallucinations, described invisible entities that are placing things about her house.  I called the patient's son.  The son requests psychiatric evaluation.  He states that over the past weeks the patient has been increasingly disoriented, interacting  with auditory hallucinations.    MDM  This elderly female presents with concern in her back pain.  On exam she is neurologically intact.  As the patient's evaluation progressed it became apparent that the patient was hallucinating.  With this recognition, and a request from her family for further psychiatric evaluation, she was medically clear for evaluation by our behavioral health team.  She does has evidence of hypoglycemia, suggestion of UTI, though absent fever, urine culture was sent to my treatment will be withheld pending results.  Gerhard Munch, MD 03/18/12 0981  Gerhard Munch, MD 03/19/12 [phone]

## 2012-03-18 NOTE — ED Notes (Addendum)
Pt c/o back pain to "lumbar region" and also reports "my hemoglobin is probably about a 2." reports feeling weakness. Denies blood in stool. Pt appears pale.

## 2012-03-19 ENCOUNTER — Emergency Department (HOSPITAL_COMMUNITY): Payer: Medicare Other

## 2012-03-19 LAB — BASIC METABOLIC PANEL
CO2: 17 mEq/L — ABNORMAL LOW (ref 19–32)
Chloride: 112 mEq/L (ref 96–112)
Creatinine, Ser: 1.99 mg/dL — ABNORMAL HIGH (ref 0.50–1.10)
GFR calc Af Amer: 26 mL/min — ABNORMAL LOW (ref >90)
Potassium: 3.4 mEq/L — ABNORMAL LOW (ref 3.5–5.1)

## 2012-03-19 LAB — RAPID URINE DRUG SCREEN, HOSP PERFORMED
Barbiturates: NOT DETECTED
Cocaine: NOT DETECTED
Opiates: POSITIVE — AB
Tetrahydrocannabinol: NOT DETECTED

## 2012-03-19 LAB — ETHANOL: Alcohol, Ethyl (B): <11 mg/dL (ref 0–11)

## 2012-03-19 LAB — SALICYLATE LEVEL: Salicylate Lvl: <2 mg/dL — ABNORMAL LOW (ref 2.8–20.0)

## 2012-03-19 MED ORDER — IBUPROFEN 200 MG PO TABS: 600.0000 mg | ORAL_TABLET | Freq: Three times a day (TID) | ORAL | Status: DC | PRN

## 2012-03-19 MED ORDER — CIPROFLOXACIN HCL 500 MG PO TABS
500.0000 mg | ORAL_TABLET | Freq: Two times a day (BID) | ORAL | Status: DC
  Administered 2012-03-19: 500 mg via ORAL
  Filled 2012-03-19: qty 1

## 2012-03-19 MED ORDER — ACETAMINOPHEN 325 MG PO TABS
650.0000 mg | ORAL_TABLET | ORAL | Status: DC | PRN
  Administered 2012-03-19: 650 mg via ORAL
  Filled 2012-03-19: qty 2

## 2012-03-19 MED ORDER — LORAZEPAM 1 MG PO TABS: 1.0000 mg | ORAL_TABLET | Freq: Three times a day (TID) | ORAL | Status: DC | PRN

## 2012-03-19 NOTE — ED Provider Notes (Addendum)
CHRISHAWNA FARINA is a 78 y.o. female came in yesterday for hallucinations. UA + leuks, not treated overnight because lack of symptoms. However, she is still psychotic and unable to get history this AM. Will start on cipro. Will also do CT head. ACT saw her last night but unable to get more history. Concerned for possible sundowning, worsening dementia, vs psych. Psych will see patient today. May benefit from nursing home placement if cleared by psych.    Richardean Canal, MD 03/19/12 0710  2 PM Patient' family visited her and took her away. She is not under IVC. The nurse called security who is unable to reach her. She was called to return to ED.   Results for orders placed during the hospital encounter of 03/18/12  CBC WITH DIFFERENTIAL      Component Value Range   WBC 5.0  4.0 - 10.5 K/uL   RBC 3.93  3.87 - 5.11 MIL/uL   Hemoglobin 12.1  12.0 - 15.0 g/dL   HCT 86.5  78.4 - 69.6 %   MCV 92.1  78.0 - 100.0 fL   MCH 30.8  26.0 - 34.0 pg   MCHC 33.4  30.0 - 36.0 g/dL   RDW 29.5 (*) 28.4 - 13.2 %   Platelets 162  150 - 400 K/uL   Neutrophils Relative 66  43 - 77 %   Neutro Abs 3.3  1.7 - 7.7 K/uL   Lymphocytes Relative 23  12 - 46 %   Lymphs Abs 1.2  0.7 - 4.0 K/uL   Monocytes Relative 10  3 - 12 %   Monocytes Absolute 0.5  0.1 - 1.0 K/uL   Eosinophils Relative 1  0 - 5 %   Eosinophils Absolute 0.0  0.0 - 0.7 K/uL   Basophils Relative 0  0 - 1 %   Basophils Absolute 0.0  0.0 - 0.1 K/uL  COMPREHENSIVE METABOLIC PANEL      Component Value Range   Sodium 137  135 - 145 mEq/L   Potassium 3.0 (*) 3.5 - 5.1 mEq/L   Chloride 108  96 - 112 mEq/L   CO2 15 (*) 19 - 32 mEq/L   Glucose, Bld 98  70 - 99 mg/dL   BUN 22  6 - 23 mg/dL   Creatinine, Ser 4.40 (*) 0.50 - 1.10 mg/dL   Calcium 10.2  8.4 - 72.5 mg/dL   Total Protein 6.8  6.0 - 8.3 g/dL   Albumin 3.9  3.5 - 5.2 g/dL   AST 16  0 - 37 U/L   ALT 10  0 - 35 U/L   Alkaline Phosphatase 70  39 - 117 U/L   Total Bilirubin 0.6  0.3 - 1.2 mg/dL   GFR calc non Af Amer 19 (*) >90 mL/min   GFR calc Af Amer 22 (*) >90 mL/min  URINALYSIS, ROUTINE W REFLEX MICROSCOPIC      Component Value Range   Color, Urine YELLOW  YELLOW   APPearance CLOUDY (*) CLEAR   Specific Gravity, Urine 1.016  1.005 - 1.030   pH 7.5  5.0 - 8.0   Glucose, UA NEGATIVE  NEGATIVE mg/dL   Hgb urine dipstick TRACE (*) NEGATIVE   Bilirubin Urine NEGATIVE  NEGATIVE   Ketones, ur NEGATIVE  NEGATIVE mg/dL   Protein, ur 366 (*) NEGATIVE mg/dL   Urobilinogen, UA 0.2  0.0 - 1.0 mg/dL   Nitrite NEGATIVE  NEGATIVE   Leukocytes, UA LARGE (*) NEGATIVE  URINE MICROSCOPIC-ADD ON  Component Value Range   Squamous Epithelial / LPF RARE  RARE   WBC, UA 21-50  <3 WBC/hpf   Bacteria, UA MANY (*) RARE  URINE RAPID DRUG SCREEN (HOSP PERFORMED)      Component Value Range   Opiates POSITIVE (*) NONE DETECTED   Cocaine NONE DETECTED  NONE DETECTED   Benzodiazepines NONE DETECTED  NONE DETECTED   Amphetamines NONE DETECTED  NONE DETECTED   Tetrahydrocannabinol NONE DETECTED  NONE DETECTED   Barbiturates NONE DETECTED  NONE DETECTED  BASIC METABOLIC PANEL      Component Value Range   Sodium 138  135 - 145 mEq/L   Potassium 3.4 (*) 3.5 - 5.1 mEq/L   Chloride 112  96 - 112 mEq/L   CO2 17 (*) 19 - 32 mEq/L   Glucose, Bld 88  70 - 99 mg/dL   BUN 18  6 - 23 mg/dL   Creatinine, Ser 1.61 (*) 0.50 - 1.10 mg/dL   Calcium 09.6  8.4 - 04.5 mg/dL   GFR calc non Af Amer 23 (*) >90 mL/min   GFR calc Af Amer 26 (*) >90 mL/min  ETHANOL      Component Value Range   Alcohol, Ethyl (B) <11  0 - 11 mg/dL  ACETAMINOPHEN LEVEL      Component Value Range   Acetaminophen (Tylenol), Serum <15.0  10 - 30 ug/mL  SALICYLATE LEVEL      Component Value Range   Salicylate Lvl <2.0 (*) 2.8 - 20.0 mg/dL   Dg Lumbar Spine Complete  03/18/2012  *RADIOLOGY REPORT*  Clinical Data: Back pain and weakness.  No trauma.  LUMBAR SPINE - COMPLETE 4+ VIEW  Comparison: 10/21/2011  Findings: Vertebral body  alignment and heights are within normal. There is mild spondylosis present.  There is moderate disc space narrowing at the L2-3 level unchanged.  There is no compression fracture or subluxation. Facet arthropathy.  Remainder of the exam is unchanged.  IMPRESSION: Mild spondylosis of the lumbar spine to include facet arthropathy. Moderate degenerate disc disease at the L2-3 level unchanged.   Original Report Authenticated By: Elberta Fortis, M.D.    Ct Head Wo Contrast  03/19/2012  *RADIOLOGY REPORT*  Clinical Data: Generalized weakness  CT HEAD WITHOUT CONTRAST  Technique:  Contiguous axial images were obtained from the base of the skull through the vertex without contrast.  Comparison: 10/31/2011  Findings: Chronic ischemic changes in the periventricular and subcortical white matter.  Global atrophy appropriate to age.  No mass effect, midline shift, or acute intracranial hemorrhage. Visualized paranasal sinuses are clear.  Mastoid air cells are clear.  Intact cranium.  IMPRESSION: No acute intracranial pathology.   Original Report Authenticated By: Jolaine Click, M.D.       Richardean Canal, MD 03/19/12 [phone]

## 2012-03-19 NOTE — ED Notes (Addendum)
At approximately 1402,  Asher Muir, NT  asked me if I had seen the patient in room 21. I replied that I had just seen the patient at approximately 1352 and that the patient had been resting as she has been doing since 0700 this morning. The pt displayed no signs of agitation, combativeness, or impulsivity at this time or any time during the shift and has been compliant with all medical interventions.  At approximately 1403, I began to search for my patient, checking the restrooms and common areas as well as hallways. I notified the charge nurse, Diane at this time as well as security so that we could search further for the patient. Ikram notified me that the patient just had a visitor while I was in room 19 with my other patient. Parties involved were notified of a description of the patient to better search for her.   Security arrived at approximately 1407 and asked what was going on. I described the patient again and the situation. Security Psychologist, prison and probation services informed me that an older female with white hair was standing out front with a patient fitting my patient's description and the patient was sitting in a wheel chair. The white-haired female went to pull the car around and helped her into it to leave. Security Officer Vonna Kotyk informed me that this occurred at approximately 1405. Security immediately went to the front to search but there was no trace of the visitor or patient.   A bystander said she overheard [place] talking and said she saw our patient being wheeled by in a wheelchair after talking to one of our NT's.  I went to the other side to question the NT's about this. I spoke to [place] [employer], NT and he confirmed that he provided the visitor (same description) with a wheelchair and subsequently observed him rolling the patient (same description fitting my patient) towards the exit a few minutes prior to our conversation.   The patient was awaiting a Telepsych and Act Team evaluation to determine the  possibility of inpatient psychiatric evaluation and treatment. The patient was voluntary at this time. We searched for approximately 15 minutes until we were able to confirm that the patient and visitor had definitely left the premises.

## 2012-03-19 NOTE — BHH Counselor (Signed)
Writer tried to assess pt but pt suspicious. "Why are you coming in here and asking me these questions?". Pt didn't remember speaking with MD last night. Pt refuses to answer any questions. Oncoming ACT will attempt to assess.

## 2012-03-19 NOTE — ED Notes (Signed)
ACT team member stated that pt will not answer her questions.  ACT eval will be attempted with the next team member.

## 2012-03-19 NOTE — ED Notes (Signed)
Son: Burnetta Sabin called (no answer; message left) to notify him to please bring the patient back to the ED for peripheral IV removal from the left upper arm. Family member (son) was informed to please contact the husband and/or bring the patient himself for this process and that the avoidance of doing so puts the patient at risk for infection among other concerns.

## 2012-03-19 NOTE — ED Notes (Signed)
ACT states that pt is still on list to be seen at this time.

## 2012-03-21 LAB — URINE CULTURE: Colony Count: >=100000

## 2012-03-22 NOTE — ED Notes (Signed)
Positive urnc- chart sent to edp office for review.  

## 2012-03-24 ENCOUNTER — Telehealth (HOSPITAL_COMMUNITY): Payer: Self-pay | Admitting: Emergency Medicine

## 2012-03-25 ENCOUNTER — Telehealth (HOSPITAL_COMMUNITY): Payer: Self-pay | Admitting: Emergency Medicine

## 2012-03-25 NOTE — ED Notes (Signed)
Rx called in to [facility] on [address] in [place] by [address].

## 2012-03-25 NOTE — ED Notes (Signed)
Chart returned from EDP office. Prescribed Cipro 500 mg po bid x 5 days. Prescribed by Felicie Morn NP.

## 2012-08-28 ENCOUNTER — Observation Stay (HOSPITAL_COMMUNITY): Payer: Medicare Other

## 2012-08-28 ENCOUNTER — Encounter (HOSPITAL_COMMUNITY): Payer: Self-pay

## 2012-08-28 ENCOUNTER — Observation Stay (HOSPITAL_COMMUNITY)
Admission: EM | Admit: 2012-08-28 | Discharge: 2012-08-30 | Disposition: A | Discharge status: Home / Self Care | Payer: Medicare Other | Attending: Internal Medicine | Admitting: Internal Medicine

## 2012-08-28 ENCOUNTER — Emergency Department (HOSPITAL_COMMUNITY): Payer: Medicare Other

## 2012-08-28 DIAGNOSIS — M545 Low back pain, unspecified: Secondary | ICD-10-CM

## 2012-08-28 DIAGNOSIS — N319 Neuromuscular dysfunction of bladder, unspecified: Secondary | ICD-10-CM | POA: Insufficient documentation

## 2012-08-28 DIAGNOSIS — R079 Chest pain, unspecified: Secondary | ICD-10-CM | POA: Diagnosis present

## 2012-08-28 DIAGNOSIS — M1990 Unspecified osteoarthritis, unspecified site: Secondary | ICD-10-CM

## 2012-08-28 DIAGNOSIS — N390 Urinary tract infection, site not specified: Secondary | ICD-10-CM

## 2012-08-28 DIAGNOSIS — N289 Disorder of kidney and ureter, unspecified: Secondary | ICD-10-CM | POA: Diagnosis present

## 2012-08-28 DIAGNOSIS — I129 Hypertensive chronic kidney disease with stage 1 through stage 4 chronic kidney disease, or unspecified chronic kidney disease: Secondary | ICD-10-CM | POA: Insufficient documentation

## 2012-08-28 DIAGNOSIS — G8929 Other chronic pain: Secondary | ICD-10-CM | POA: Insufficient documentation

## 2012-08-28 DIAGNOSIS — E876 Hypokalemia: Secondary | ICD-10-CM | POA: Insufficient documentation

## 2012-08-28 DIAGNOSIS — E872 Acidosis, unspecified: Secondary | ICD-10-CM | POA: Insufficient documentation

## 2012-08-28 DIAGNOSIS — M549 Dorsalgia, unspecified: Secondary | ICD-10-CM

## 2012-08-28 DIAGNOSIS — D649 Anemia, unspecified: Secondary | ICD-10-CM

## 2012-08-28 DIAGNOSIS — F22 Delusional disorders: Secondary | ICD-10-CM

## 2012-08-28 DIAGNOSIS — I252 Old myocardial infarction: Secondary | ICD-10-CM | POA: Insufficient documentation

## 2012-08-28 DIAGNOSIS — Z936 Other artificial openings of urinary tract status: Secondary | ICD-10-CM | POA: Insufficient documentation

## 2012-08-28 DIAGNOSIS — R9431 Abnormal electrocardiogram [ECG] [EKG]: Secondary | ICD-10-CM | POA: Insufficient documentation

## 2012-08-28 DIAGNOSIS — D61818 Other pancytopenia: Secondary | ICD-10-CM | POA: Insufficient documentation

## 2012-08-28 DIAGNOSIS — I10 Essential (primary) hypertension: Secondary | ICD-10-CM

## 2012-08-28 DIAGNOSIS — N189 Chronic kidney disease, unspecified: Secondary | ICD-10-CM | POA: Insufficient documentation

## 2012-08-28 DIAGNOSIS — F259 Schizoaffective disorder, unspecified: Secondary | ICD-10-CM | POA: Insufficient documentation

## 2012-08-28 LAB — CREATININE, SERUM
Creatinine, Ser: 1.84 mg/dL — ABNORMAL HIGH (ref 0.50–1.10)
GFR calc non Af Amer: 25 mL/min — ABNORMAL LOW (ref >90)

## 2012-08-28 LAB — CBC
MCH: 29.4 pg (ref 26.0–34.0)
MCHC: 31.8 g/dL (ref 30.0–36.0)
Platelets: 132 10*3/uL — ABNORMAL LOW (ref 150–400)
RBC: 3.66 MIL/uL — ABNORMAL LOW (ref 3.87–5.11)
RDW: 16.2 % — ABNORMAL HIGH (ref 11.5–15.5)
RDW: 16.1 % — ABNORMAL HIGH (ref 11.5–15.5)
WBC: 3.7 10*3/uL — ABNORMAL LOW (ref 4.0–10.5)

## 2012-08-28 LAB — URINALYSIS, ROUTINE W REFLEX MICROSCOPIC
Glucose, UA: NEGATIVE mg/dL
Ketones, ur: NEGATIVE mg/dL
Nitrite: NEGATIVE
Specific Gravity, Urine: 1.013 (ref 1.005–1.030)
pH: 7.5 (ref 5.0–8.0)

## 2012-08-28 LAB — BASIC METABOLIC PANEL
CO2: 16 mEq/L — ABNORMAL LOW (ref 19–32)
Chloride: 115 mEq/L — ABNORMAL HIGH (ref 96–112)
GFR calc Af Amer: 28 mL/min — ABNORMAL LOW (ref >90)
Potassium: 3.3 mEq/L — ABNORMAL LOW (ref 3.5–5.1)
Sodium: 141 mEq/L (ref 135–145)

## 2012-08-28 LAB — URINE MICROSCOPIC-ADD ON

## 2012-08-28 LAB — POCT I-STAT TROPONIN I: Troponin i, poc: 0.03 ng/mL (ref 0.00–0.08)

## 2012-08-28 MED ORDER — HEPARIN SODIUM (PORCINE) 5000 UNIT/ML IJ SOLN
5000.0000 [IU] | Freq: Three times a day (TID) | INTRAMUSCULAR | Status: DC
  Administered 2012-08-28 – 2012-08-30 (×5): 5000 [IU] via SUBCUTANEOUS
  Filled 2012-08-28 (×8): qty 1

## 2012-08-28 MED ORDER — SODIUM CHLORIDE 0.9 % IV SOLN
INTRAVENOUS | Status: DC
  Administered 2012-08-28 – 2012-08-30 (×3): via INTRAVENOUS

## 2012-08-28 MED ORDER — HYDROMORPHONE HCL PF 1 MG/ML IJ SOLN
1.0000 mg | Freq: Once | INTRAMUSCULAR | Status: AC
  Administered 2012-08-28: 1 mg via INTRAVENOUS
  Filled 2012-08-28: qty 1

## 2012-08-28 MED ORDER — HYDROMORPHONE HCL PF 1 MG/ML IJ SOLN
1.0000 mg | INTRAMUSCULAR | Status: AC | PRN
  Administered 2012-08-29 (×2): 1 mg via INTRAVENOUS
  Filled 2012-08-28 (×2): qty 1

## 2012-08-28 MED ORDER — MORPHINE SULFATE ER 15 MG PO TBCR
15.0000 mg | EXTENDED_RELEASE_TABLET | Freq: Three times a day (TID) | ORAL | Status: DC
  Administered 2012-08-28 – 2012-08-30 (×6): 15 mg via ORAL
  Filled 2012-08-28 (×6): qty 1

## 2012-08-28 MED ORDER — NITROGLYCERIN 0.4 MG SL SUBL
SUBLINGUAL_TABLET | SUBLINGUAL | Status: AC
  Administered 2012-08-28: 0.4 mg via SUBLINGUAL
  Filled 2012-08-28: qty 25

## 2012-08-28 MED ORDER — ONDANSETRON HCL 4 MG/2ML IJ SOLN
4.0000 mg | Freq: Once | INTRAMUSCULAR | Status: AC
  Administered 2012-08-28: 4 mg via INTRAVENOUS
  Filled 2012-08-28: qty 2

## 2012-08-28 MED ORDER — ONDANSETRON HCL 4 MG/2ML IJ SOLN
4.0000 mg | Freq: Three times a day (TID) | INTRAMUSCULAR | Status: AC | PRN
  Administered 2012-08-29: 4 mg via INTRAVENOUS
  Filled 2012-08-28: qty 2

## 2012-08-28 MED ORDER — HYDROCODONE-ACETAMINOPHEN 5-325 MG PO TABS
1.0000 | ORAL_TABLET | ORAL | Status: DC | PRN
  Administered 2012-08-29: 2 via ORAL
  Filled 2012-08-28: qty 2

## 2012-08-28 MED ORDER — POTASSIUM CHLORIDE 10 MEQ/100ML IV SOLN
10.0000 meq | INTRAVENOUS | Status: AC
  Administered 2012-08-28 – 2012-08-29 (×2): 10 meq via INTRAVENOUS
  Filled 2012-08-28 (×2): qty 100

## 2012-08-28 MED ORDER — SODIUM CHLORIDE 0.9 % IJ SOLN
3.0000 mL | Freq: Two times a day (BID) | INTRAMUSCULAR | Status: DC
  Administered 2012-08-28: 3 mL via INTRAVENOUS

## 2012-08-28 NOTE — ED Provider Notes (Signed)
Medical screening examination/treatment/procedure(s) were conducted as a shared visit with non-physician practitioner(s) and myself.  I personally evaluated the patient during the encounter   Loren Racer, MD 08/28/12 2220

## 2012-08-28 NOTE — H&P (Addendum)
Hospitalist Admission History and Physical  Patient name: Rhonda [name] Medical record number: [id number] Date of birth: 1930/06/20 Age: 79 y.o. Gender: female  Primary Care Provider: No primary provider on file.  Chief Complaint: back pain, recent chest pain, abnormal EKG History of Present Illness:This is a 79 y.o. year old female with multiple medical problems including lumbar djd, MI x3 per pt, CKD, schizophrenia, urostomy s/p cystectomy presenting with low back pain, recent CP, and abnormal EKG. Pt initially presented to ER with chief complaint of LBP. As a part of her work up, an EKG was obtained that showed new contiguousT wave inversions in V5 and V6 in comparison to old EKG. 1st set of troponins negative. No reported CP at the time.  Hospitalist service was called for admission for CP rule out given prior cardiac history.  In discussion with pt, she has had some central CP over the weekend that was fairly mild in nature with no radiation. Fairly limited history as pt is poor historian. Pt did also have some nausea and diaphoresis around time of CP. CP resolved on its own per pt. CP mildly similar to prior MIs per pt. No active CP currently.   Pt has had her care in the ER both her in [facility] and in [place]. Does not have a PCP. Does not have a cardiologist, urologist, or nephrologist for follow up.   In terms of back pain, this is a chronic issue. Has had multiple back surgeries in the past. Pain has progressively worsened over last 2-3 weeks. No radicular sxs. No known strenuous activity or trauma. Takes MS contin 15mg  tabs tid. This has been minimally-mildly effective in managing pain.    Patient Active Problem List   Diagnosis Date Noted  . Delusional disorder 11/03/2011    Class: Chronic  . Chest pain 10/31/2011  . Anemia 10/31/2011  . HTN (hypertension) 10/31/2011  . Acute on chronic renal insufficiency 10/31/2011  . UTI (lower urinary tract infection) 10/31/2011  .  Arthritis    Past Medical History: Past Medical History  Diagnosis Date  . Hypertension   . Anemia   . Arthritis   . Pneumonia   . Goiter   . Schizo affective schizophrenia   . Myocardial infarct     Past Surgical History: Past Surgical History  Procedure Laterality Date  . Abdominal hysterectomy    . Back surgery    . Ileostomy    . Hernia repair    . Cholecystectomy      Social History: History   Social History  . Marital Status: Married    Spouse Name: N/A    Number of Children: N/A  . Years of Education: N/A   Social History Main Topics  . Smoking status: Never Smoker   . Smokeless tobacco: Never Used  . Alcohol Use: No  . Drug Use: No  . Sexually Active:    Other Topics Concern  . None   Social History Narrative  . None    Family History: Family History  Problem Relation Age of Onset  . Alzheimer's disease Mother     Allergies: Allergies  Allergen Reactions  . Macrodantin (Nitrofurantoin Macrocrystal) Other (See Comments)    "Makes her fall"  . Septra (Sulfamethoxazole W-Trimethoprim) Other (See Comments)    "Makes her fall"  . Soma (Carisoprodol) Other (See Comments)    "Makes her fall"    Current Facility-Administered Medications  Medication Dose Route Frequency Provider Last Rate Last Dose  .  0.9 %  sodium chloride infusion   Intravenous Continuous Doree Albee, MD      . heparin injection 5,000 Units  5,000 Units Subcutaneous Q8H Doree Albee, MD      . HYDROcodone-acetaminophen (NORCO/VICODIN) 5-325 MG per tablet 1-2 tablet  1-2 tablet Oral Q4H PRN Doree Albee, MD      . HYDROmorphone (DILAUDID) injection 1 mg  1 mg Intravenous Q4H PRN Nicole Pisciotta, PA-C      . morphine (MS CONTIN) 12 hr tablet 15 mg  15 mg Oral Q8H Doree Albee, MD      . ondansetron [facility]) injection 4 mg  4 mg Intravenous Q8H PRN Nicole Pisciotta, PA-C      . sodium chloride 0.9 % injection 3 mL  3 mL Intravenous Q12H Doree Albee, MD       Current  Outpatient Prescriptions  Medication Sig Dispense Refill  . morphine (MS CONTIN) 15 MG 12 hr tablet Take 15 mg by mouth every 8 (eight) hours. For pain       Review Of Systems: 12 point ROS negative except as noted above in HPI.  Physical Exam: Filed Vitals:   08/28/12 1850  BP: 129/58  Pulse: 73  Temp: 98.6 F (37 C)  Resp: 18    General: cooperative and cachectic, NAD HEENT: PERRLA and extra ocular movement intact Heart: S1, S2 normal, no murmur, rub or gallop, regular rate and rhythm Lungs: clear to auscultation Abdomen: abdomen is soft without significant tenderness, masses, organomegaly or guarding Extremities: extremities normal, atraumatic, no cyanosis or edema, + mild lumbar TTP  Skin:no rashes, no ecchymoses Neurology: normal without focal findings  Labs and Imaging: Lab Results  Component Value Date/Time   NA 141 08/28/2012  7:10 PM   K 3.3* 08/28/2012  7:10 PM   CL 115* 08/28/2012  7:10 PM   CO2 16* 08/28/2012  7:10 PM   BUN 18 08/28/2012  7:10 PM   CREATININE 1.90* 08/28/2012  7:10 PM   GLUCOSE 102* 08/28/2012  7:10 PM   Lab Results  Component Value Date   WBC 3.7* 08/28/2012   HGB 10.5* 08/28/2012   HCT 33.9* 08/28/2012   MCV 92.6 08/28/2012   PLT 132* 08/28/2012    Dg Chest Port 1 View  08/28/2012   *RADIOLOGY REPORT*  Clinical Data: Shortness of breath, chest pain  PORTABLE CHEST - 1 VIEW  Comparison: 10/31/2011 cyst  Findings: Normal heart size and vascularity.  Chronic right hemidiaphragm elevation as before.  Stable lung aeration.  No new airspace process, collapse, consolidation, edema, effusion or pneumothorax.  Trachea midline.  Degenerative changes of the right shoulder.  IMPRESSION: Stable exam.  No superimposed acute process   Original Report Authenticated By: Judie Petit. Miles Costain, M.D.     Assessment and Plan: Rhonda [name] is a 79 y.o. year old female presenting with abnormal ekg and back pain   Abnormal EKG: no active CP currently. Will cycle cardiac enzymes.  Risk stratification labs. Consult cards in am.   Low back pain: Acute on chronic issue. L spine films pending. Continue MS Contin. Percocet for breakthrough pain.   CKD: Stage III-IV CKD. Cr at/near baseline. Renal consult given poor follow up.   Hypokalemia: Will replete. Check mag level.   Metabolic Acidosis: AG 20. Similar to previous admission. Likely secondary to renal disease. Gentle rehydration and reassess in am. Will check lactate to coorelate.   Anemia: baseline hgb 9-11. Hgb near baseline. Likely anemia of renal disease. Continue to follow.  FEN/GI: heart healthy diet.  Prophylaxis: sub q heparin  Disposition: pending further evaluation. Needs to establish outpt primary care. Social work consult  Code Status:limited code; CPR only        Doree Albee MD  Pager: [phone]

## 2012-08-28 NOTE — ED Notes (Addendum)
Pt c/o mid to low back pain x 2 days.  Pt is dry heaving in triage because she states the pain is so severe she is nauseated.  Pt denies diarrhea, urinary sx (urostomy bag).  Pt appears very pale.  Hx of stroke and 3 MIs.

## 2012-08-28 NOTE — ED Provider Notes (Signed)
History     CSN: [id number]  Arrival date & time 08/28/12  4098   First MD Initiated Contact with Patient 08/28/12 1913      Chief Complaint  Patient presents with  . Back Pain    (Consider location/radiation/quality/duration/timing/severity/associated sxs/prior treatment) HPI  Rhonda [name] is a 79 y.o. female with chronic low back pain complaining of exacerbation over the last day or 2. Patient states the pain is in the low back and radiates down to bilateral gluteal areas. Exacerbated by weightbearing. Been taking MSIR 15 mg every 12 hours with little relief. Patient has history of multiple back surgeries. She denies any trauma, perineal anesthesia, weakness, difficulty ambulating, abdominal pain, chest pain, shortness of breath, nausea vomiting, fever.   Past Medical History  Diagnosis Date  . Hypertension   . Anemia   . Arthritis   . Pneumonia   . Goiter   . Schizo affective schizophrenia   . Myocardial infarct     Past Surgical History  Procedure Laterality Date  . Abdominal hysterectomy    . Back surgery    . Ileostomy    . Hernia repair    . Cholecystectomy      Family History  Problem Relation Age of Onset  . Alzheimer's disease Mother     History  Substance Use Topics  . Smoking status: Never Smoker   . Smokeless tobacco: Never Used  . Alcohol Use: No    OB History   Grav Para Term Preterm Abortions TAB SAB Ect Mult Living                  Review of Systems  Constitutional: Negative for fever.  HENT: Negative for neck pain.   Respiratory: Negative for shortness of breath.   Cardiovascular: Negative for chest pain.  Gastrointestinal: Negative for nausea, vomiting, abdominal pain and diarrhea.  Genitourinary: Negative for dysuria and difficulty urinating.  Musculoskeletal: Positive for back pain.  Neurological: Negative for weakness and numbness.  All other systems reviewed and are negative.    Allergies  Macrodantin; Septra; and  Soma  Home Medications   Current Outpatient Rx  Name  Route  Sig  Dispense  Refill  . aspirin-acetaminophen-caffeine (EXCEDRIN MIGRAINE) 250-250-65 MG per tablet   Oral   Take 2 tablets by mouth every 6 (six) hours as needed. For headache         . morphine (MS CONTIN) 15 MG 12 hr tablet   Oral   Take 15 mg by mouth every 8 (eight) hours. For pain         . Multiple Vitamin (MULTIVITAMIN WITH MINERALS) TABS   Oral   Take 1 tablet by mouth daily.           BP 129/58  Pulse 73  Temp(Src) 98.6 F (37 C) (Oral)  Resp 18  SpO2 100%  Physical Exam  Nursing note and vitals reviewed. Constitutional: She is oriented to person, place, and time. She appears well-developed and well-nourished. No distress.  HENT:  Head: Normocephalic.  Mouth/Throat: Oropharynx is clear and moist.  Eyes: Conjunctivae and EOM are normal.  Cardiovascular: Normal rate, regular rhythm and intact distal pulses.   Pulmonary/Chest: Effort normal and breath sounds normal. No stridor. No respiratory distress. She has no wheezes. She has no rales. She exhibits no tenderness.  Abdominal: Soft. Bowel sounds are normal. She exhibits no distension and no mass. There is no tenderness. There is no rebound.  Musculoskeletal: Normal range of motion. She  exhibits no edema and no tenderness.  Strength is 4/5 in the bilateral lower extremities, straight leg raise is positive bilaterally at about 10.  Neurological: She is alert and oriented to person, place, and time.  Psychiatric: She has a normal mood and affect.    ED Course  Procedures (including critical care time)  Labs Reviewed  CBC - Abnormal; Notable for the following:    WBC 3.7 (*)    RBC 3.66 (*)    Hemoglobin 10.5 (*)    HCT 33.9 (*)    RDW 16.2 (*)    Platelets 132 (*)    All other components within normal limits  BASIC METABOLIC PANEL - Abnormal; Notable for the following:    Potassium 3.3 (*)    Chloride 115 (*)    CO2 16 (*)    Glucose,  Bld 102 (*)    Creatinine, Ser 1.90 (*)    GFR calc non Af Amer 24 (*)    GFR calc Af Amer 28 (*)    All other components within normal limits  URINALYSIS, ROUTINE W REFLEX MICROSCOPIC  POCT I-STAT TROPONIN I   No results found.   Date: 08/28/2012 18:52  Rate: 67  Rhythm: normal sinus rhythm  QRS Axis: normal  Intervals: normal  ST/T Wave abnormalities: 1 mm ST depression in V5 and V6, and in the inferior leads  Conduction Disutrbances:none  Narrative Interpretation:   Old EKG Reviewed: changes noted   Date: 08/28/2012  20:01  Rate:63  Rhythm: normal sinus rhythm  QRS Axis: normal  Intervals: normal  ST/T Wave abnormalities: nonspecific ST/T changes  Conduction Disutrbances:first-degree A-V block   Narrative Interpretation: Borderline first degree AV block with new T wave inversions in V5 ST depression in V5 V6 and lead 2  Old EKG Reviewed: changes noted  1. EKG abnormalities   2. Back pain       MDM   Filed Vitals:   08/28/12 1850  BP: 129/58  Pulse: 73  Temp: 98.6 F (37 C)  TempSrc: Oral  Resp: 18  SpO2: 100%     Rhonda [name] is a 79 y.o. female with low back pain exacerbation. Triage initiated EKG shows ST depression in the inferior and lateral leads. Troponin is negative, EKG is of low quality with artifact I will repeated to see if these are consistent.  Second EKG is of higher quality, there is a borderline first degree AV block and ST depressions in the inferior and lateral leads are still present. Patient will need to be admitted for EKG changes and lower risk rule. Patient denies chest pain, shortness of breath, nausea vomiting Patient states that she has had 3 prior MIs with stent placement. She does not have a primary care provider and is not consistently follow with a cardiologist.   Pt will be admitted for observation and serial troponins to Dr. Alvester Morin.  Medications  ondansetron (ZOFRAN) injection 4 mg (not administered)  HYDROmorphone  (DILAUDID) injection 1 mg (not administered)  HYDROmorphone (DILAUDID) injection 1 mg (1 mg Intravenous Given 08/28/12 2000)  ondansetron (ZOFRAN) injection 4 mg (4 mg Intravenous Given 08/28/12 2000)    Wynetta Emery, PA-C 08/28/12 2047

## 2012-08-29 ENCOUNTER — Observation Stay (HOSPITAL_COMMUNITY): Payer: Medicare Other

## 2012-08-29 DIAGNOSIS — M549 Dorsalgia, unspecified: Secondary | ICD-10-CM

## 2012-08-29 DIAGNOSIS — R079 Chest pain, unspecified: Secondary | ICD-10-CM

## 2012-08-29 DIAGNOSIS — D61818 Other pancytopenia: Secondary | ICD-10-CM

## 2012-08-29 DIAGNOSIS — D649 Anemia, unspecified: Secondary | ICD-10-CM

## 2012-08-29 DIAGNOSIS — M545 Low back pain: Secondary | ICD-10-CM

## 2012-08-29 DIAGNOSIS — N189 Chronic kidney disease, unspecified: Secondary | ICD-10-CM

## 2012-08-29 DIAGNOSIS — N289 Disorder of kidney and ureter, unspecified: Secondary | ICD-10-CM

## 2012-08-29 LAB — TROPONIN I
Troponin I: <0.3 ng/mL (ref <0.30)
Troponin I: <0.3 ng/mL (ref <0.30)

## 2012-08-29 LAB — BASIC METABOLIC PANEL
CO2: 17 mEq/L — ABNORMAL LOW (ref 19–32)
Calcium: 8.8 mg/dL (ref 8.4–10.5)
Glucose, Bld: 99 mg/dL (ref 70–99)
Potassium: 4.3 mEq/L (ref 3.5–5.1)
Sodium: 139 mEq/L (ref 135–145)

## 2012-08-29 LAB — LIPID PANEL
Cholesterol: 108 mg/dL (ref 0–200)
HDL: 37 mg/dL — ABNORMAL LOW (ref >39)
LDL Cholesterol: 50 mg/dL (ref 0–99)
Triglycerides: 104 mg/dL (ref <150)
VLDL: 21 mg/dL (ref 0–40)

## 2012-08-29 MED ORDER — HYDROMORPHONE HCL PF 1 MG/ML IJ SOLN
0.5000 mg | INTRAMUSCULAR | Status: DC | PRN
  Administered 2012-08-29 – 2012-08-30 (×2): 0.5 mg via INTRAVENOUS
  Filled 2012-08-29 (×2): qty 1

## 2012-08-29 MED ORDER — NITROGLYCERIN 0.4 MG SL SUBL: 0.4000 mg | SUBLINGUAL_TABLET | SUBLINGUAL | Status: DC | PRN

## 2012-08-29 MED ORDER — ENSURE COMPLETE PO LIQD
237.0000 mL | ORAL | Status: DC
  Administered 2012-08-29: 237 mL via ORAL

## 2012-08-29 MED ORDER — SODIUM BICARBONATE 650 MG PO TABS
1300.0000 mg | ORAL_TABLET | Freq: Two times a day (BID) | ORAL | Status: DC
  Administered 2012-08-29 – 2012-08-30 (×2): 1300 mg via ORAL
  Filled 2012-08-29 (×3): qty 2

## 2012-08-29 MED ORDER — TEMAZEPAM 7.5 MG PO CAPS: 7.5000 mg | ORAL_CAPSULE | Freq: Every evening | ORAL | Status: DC | PRN

## 2012-08-29 MED ORDER — ADULT MULTIVITAMIN W/MINERALS CH
1.0000 | ORAL_TABLET | Freq: Every day | ORAL | Status: DC
  Administered 2012-08-29 – 2012-08-30 (×2): 1 via ORAL
  Filled 2012-08-29 (×2): qty 1

## 2012-08-29 NOTE — Consult Note (Signed)
Requesting Physician:  Dr. Alvester Morin  Reason for Consult:  CKD with no nephrology f/u - establish patient HPI: The patient is a 79 y.o. year-old female from [place] with a background of hypertension, anemia, CAD, psychiatric illness (schizoaffective vs schizophrenia), h/o UTI's, neurogenic bladder with h/o cystectomy/left ureterostomy/external pouch (1988 in [place]) chronic lung disease,  who we are asked to see because of CKD.  She does not get regular care from anyone and has never seen nephrology here in [place]. Her creatinines have generally run from around 1.6 to 2 over the past year.  She is admitted because of back pain and chest pain.  She states that she HAS seen both Urology (a Dr. Cleatrice Burke in [facility]) as well as Nephrology (Dr. Erline Levine in [facility]) but has not kept any nephrology appointments in "a few years".  Says at one point she was "close to dialysis but got better". She has had proteinuria for at least a year per [facility] records, prior to that not known.    Serial Creatinines are as follows: Creatinine, Ser  Date/Time Value Range Status  08/29/2012  2:20 AM 1.75* 0.50 - 1.10 mg/dL Final  1/61/0960  4:54 PM 1.84* 0.50 - 1.10 mg/dL Final  0/98/1191  4:78 PM 1.90* 0.50 - 1.10 mg/dL Final  29/56/2130  8:65 AM 1.99* 0.50 - 1.10 mg/dL Final  78/46/9629  5:28 PM 2.29* 0.50 - 1.10 mg/dL Final  07/04/3242  0:10 PM 1.65* 0.50 - 1.10 mg/dL Final  05/11/2534  6:44 AM 1.66* 0.50 - 1.10 mg/dL Final  0/34/7425  9:56 AM 1.62* 0.50 - 1.10 mg/dL Final  3/87/5643  3:29 AM 1.75* 0.50 - 1.10 mg/dL Final  08/20/8414  6:06 AM 2.00* 0.50 - 1.10 mg/dL Final  06/02/6008  9:32 AM 1.66* 0.50 - 1.10 mg/dL Final  3/55/7322                        1.4  Past Medical History:  Past Medical History  Diagnosis Date  . Hypertension   . Anemia   . Arthritis   . Pneumonia   . Goiter   . Schizo affective schizophrenia   . Myocardial infarct     Past Surgical History:  Past Surgical History   Procedure Laterality Date  . Abdominal hysterectomy    . Back surgery    . Hernia repair    . Cholecystectomy    . Revision urostomy cutaneous      Family History:  Family History  Problem Relation Age of Onset  . Alzheimer's disease Mother    Social History:  reports that she has never smoked. She has never used smokeless tobacco. She reports that she does not drink alcohol or use illicit drugs.  Allergies:  Allergies  Allergen Reactions  . Macrodantin (Nitrofurantoin Macrocrystal) Other (See Comments)    "Makes her fall"  . Septra (Sulfamethoxazole W-Trimethoprim) Other (See Comments)    "Makes her fall"  . Soma (Carisoprodol) Other (See Comments)    "Makes her fall"    Home medications: Prior to Admission medications   Medication Sig Start Date End Date Taking? Authorizing Provider  morphine (MS CONTIN) 15 MG 12 hr tablet Take 15 mg by mouth every 8 (eight) hours. For pain 11/03/11  Yes Leroy Sea, MD    Inpatient medications: . feeding supplement  237 mL Oral Q24H  . heparin  5,000 Units Subcutaneous Q8H  . morphine  15 mg Oral Q8H  . multivitamin  with minerals  1 tablet Oral Daily  . sodium chloride  3 mL Intravenous Q12H    Review of Systems Gen:  Fatigue, weight loss HEENT:  Some decrease hearing Resp:  "get short of breath if my heart hurts" Cardiac:  Occasional chest pain GI:   No n/v/bleeding issues GU:  H/O uti's, wears urostomy bag, urine has been fairly clear without blood MSK occasional edema _"mostly when I come in the hospital and they give me fluids"   Physical Exam:  Blood pressure 143/68, pulse 65, temperature 97.7 F (36.5 C), temperature source Oral, resp. rate 16, height 5\' 5"  (1.651 m), weight 50.6 kg (111 lb 8.8 oz), SpO2 100.00%.  Gen: Sallow complected WF NAD Skin: no rash, cyanosis Neck: no JVD, no bruits or LAN Chest: Clear Heart: regular, no rub or gallop Heart sounds somewhat distant Abdomen: soft, urostomy bag left abdomen  hooked to foley with clear yellow urine.  Scars from prior abd surgeries Ext: No edema Distal pulses intact Neuro: alert, Ox3, no focal deficit but is a little bit of a vague historian Heme/Lymph: no bruising or LAN  Labs: Basic Metabolic Panel:  Recent Labs Lab 08/28/12 1910 08/28/12 2120 08/29/12 0220  NA 141  --  139  K 3.3*  --  4.3  CL 115*  --  115*  CO2 16*  --  17*  GLUCOSE 102*  --  99  BUN 18  --  18  CREATININE 1.90* 1.84* 1.75*  CALCIUM 9.6  --  8.8   Recent Labs Lab 08/28/12 1910 08/28/12 2120  WBC 3.7* 3.2*  HGB 10.5* 9.6*  HCT 33.9* 30.2*  MCV 92.6 92.4  PLT 132* 112*   Recent Labs Lab 08/28/12 2120 08/29/12 0220 08/29/12 0854  TROPONINI <0.30 <0.30 <0.30   Results for FLORANCE, PAOLILLO (MRN [id number]) as of 08/29/2012 12:22  Ref. Range 10/31/2011 06:22 11/04/2011 17:11 03/18/2012 20:13 08/28/2012 22:25  Color, Urine Latest Range: YELLOW  YELLOW YELLOW YELLOW YELLOW  APPearance Latest Range: CLEAR  CLOUDY (A) CLEAR CLOUDY (A) CLOUDY (A)  Specific Gravity, Urine Latest Range: 1.005-1.030  1.022 1.015 1.016 1.013  pH Latest Range: 5.0-8.0  6.5 6.5 7.5 7.5  Glucose Latest Range: NEGATIVE mg/dL NEGATIVE NEGATIVE NEGATIVE NEGATIVE  Bilirubin Urine Latest Range: NEGATIVE  NEGATIVE NEGATIVE NEGATIVE NEGATIVE  Ketones, ur Latest Range: NEGATIVE mg/dL NEGATIVE NEGATIVE NEGATIVE NEGATIVE  Protein Latest Range: NEGATIVE mg/dL >562 (A) 130 (A) 865 (A) 100 (A)  Urobilinogen, UA Latest Range: 0.0-1.0 mg/dL 0.2 0.2 0.2 0.2  Nitrite Latest Range: NEGATIVE  POSITIVE (A) NEGATIVE NEGATIVE NEGATIVE  Leukocytes, UA Latest Range: NEGATIVE  NEGATIVE NEGATIVE LARGE (A) MODERATE (A)  Hgb urine dipstick Latest Range: NEGATIVE  NEGATIVE TRACE (A) TRACE (A) NEGATIVE  Urine-Other No range found MUCOUS PRESENT   AMORPHOUS URATES/PHOSPHATES  WBC, UA Latest Range: <3 WBC/hpf 21-50 0-2 21-50 3-6  RBC / HPF Latest Range: <3 RBC/hpf 0-2 0-2    Squamous Epithelial / LPF Latest Range: RARE   RARE RARE RARE   Bacteria, UA Latest Range: RARE  MANY (A) RARE MANY (A)   Crystals Latest Range: NEGATIVE  CA OXALATE CRYSTALS (A)     Casts Latest Range: NEGATIVE   HYALINE CASTS (A)     CBG: No results found for this basename: GLUCAP,  in the last 168 hours  Xrays/Other Studies: Dg Lumbar Spine Complete  08/28/2012   *RADIOLOGY REPORT*  Clinical Data: Lumbar pain  LUMBAR SPINE - COMPLETE 4+ VIEW  Comparison:  Lumbar spine films 03/18/2012  Findings: Normal alignment of the lumbar vertebral bodies.  There is loss of disc space height at L2-L3 with endplate spurring.  No acute loss vertebral body height or disc height.  No subluxation. No pars fracture.  IMPRESSION:  1.  No acute findings lumbar spine. 2.  Degenerative disc disease most severe L2-L3, not changed from prior.   Original Report Authenticated By: Genevive Bi, M.D.   Dg Chest Port 1 View  08/28/2012   *RADIOLOGY REPORT*  Clinical Data: Shortness of breath, chest pain  PORTABLE CHEST - 1 VIEW  Comparison: 10/31/2011 cyst  Findings: Normal heart size and vascularity.  Chronic right hemidiaphragm elevation as before.  Stable lung aeration.  No new airspace process, collapse, consolidation, edema, effusion or pneumothorax.  Trachea midline.  Degenerative changes of the right shoulder.  IMPRESSION: Stable exam.  No superimposed acute process   Original Report Authenticated By: Judie Petit. Miles Costain, M.D.   Renal ultrasound 10/23/1999: THE RIGHT KIDNEY HAS A NORMAL APPEARANCE MEASURING 8.9 CM IN  LENGTH. THE LEFT KIDNEY IS ECHOGENIC WITH CORTICAL THINNING. IT MEASURES APPROXIMATELY 10.1 CM IN LENGTH.   Impression/Plan  79 year old with a myriad of medical problems for which she has not received consistent care, including hypertension, history of neurogenic bladder with prior cystectomy/ureterostomy who has had CKD as far back as 2001 (1.4 at that time) with more recent creatinines in the 1.6 to 2 range, dipstick proteinuria for the past year or so,  with renal US in 2001 showing smallish echodense kidneys at that time.  She previously followed with Nephrology in [place] (Dr. Leretha Dykes) but has not been seen for some time  1. CKD3 in setting of hypertension, neurogenic bladder with cystectomy/chronic urostomy (1988), UTI's. She has had very slow progression over time.   Do not feel extensive workup needed at this time aside from an ultrasound, urine protein:creatinine ratio, iron studies for w/u of anemia, measurement of intact PTH, SPEP and UPEP for completeness.  Oral sodium bicarbonate should be added for her metabolic acidosis.   2. Normal gap acidosis - start po sodim bicarbonate 3. Anemia will obtain appropriate studies including iron, B12, folate (she is actually pancytopenic, mildly so) May need IV iron or Aranesp 4. CKD-MBD - needs to be screened for Vit D def and secondary hyperpara 5. Back pain chronic (on morphine) 6. Abnormal ECG - per primary (cycling enzymes)  Will order these studies and f/u results.  She agrees to get her Nephrology f/u with me in our office after discharge  Camille Bal,  MD [facility] [phone] pager 08/29/2012, 12:23 PM

## 2012-08-29 NOTE — Progress Notes (Addendum)
[facility] PROGRESS NOTE  Rhonda [name] WUJ:[id number] DOB: [birth year] DOA: 08/28/2012 PCP: No primary provider on file.  Assessment/Plan: Abnormal EKG: no active CP currently. CE enzymes neg. Risk stratification labs.   Low back pain- sounds like sciatica: Acute on chronic issue. L spine films not changed. Continue MS Contin. Percocet for breakthrough pain  CKD: Stage III-IV CKD. Cr at/near baseline. Renal consult given poor follow up.   Hypokalemia: Will replete. Check mag level.   Metabolic Acidosis: AG 20. Similar to previous admission. Likely secondary to renal disease. Gentle rehydration and reassess in am. Lactic acid ok   Anemia: baseline hgb 9-11. Hgb near baseline. Likely anemia of renal disease. Continue to follow.      Code Status: partial Family Communication: patient Disposition Plan:    Consultants:  renal  Procedures:    Antibiotics:    HPI/Subjective: Patient refusing MRI C/o back pain chest pain resolved  Objective: Filed Vitals:   08/28/12 2221 08/28/12 2311 08/29/12 0408 08/29/12 1419  BP: 111/55 115/59 143/68 94/48  Pulse: 58 53 65 57  Temp: 97.5 F (36.4 C)  97.7 F (36.5 C) 98.1 F (36.7 C)  TempSrc: Oral  Oral Oral  Resp: 14  16 18   Height: 5\' 5"  (1.651 m)     Weight: 50 kg (110 lb 3.7 oz)  50.6 kg (111 lb 8.8 oz)   SpO2: 100%  100% 99%    Intake/Output Summary (Last 24 hours) at 08/29/12 1422 Last data filed at 08/29/12 0900  Gross per 24 hour  Intake 881.27 ml  Output    325 ml  Net 556.27 ml   Filed Weights   08/28/12 2221 08/29/12 0408  Weight: 50 kg (110 lb 3.7 oz) 50.6 kg (111 lb 8.8 oz)    Exam:   General:  A+Ox3, chronically ill appearing  Cardiovascular: rrr  Respiratory: clear anterior  Abdomen: +BS, soft  Musculoskeletal: moves all 4 ext   Data Reviewed: Basic Metabolic Panel:  Recent Labs Lab 08/28/12 1910 08/28/12 2120 08/29/12 0220  NA 141  --  139  K 3.3*  --  4.3  CL 115*  --   115*  CO2 16*  --  17*  GLUCOSE 102*  --  99  BUN 18  --  18  CREATININE 1.90* 1.84* 1.75*  CALCIUM 9.6  --  8.8  MG  --  2.1  --    Liver Function Tests: No results found for this basename: AST, ALT, ALKPHOS, BILITOT, PROT, ALBUMIN,  in the last 168 hours No results found for this basename: LIPASE, AMYLASE,  in the last 168 hours No results found for this basename: AMMONIA,  in the last 168 hours CBC:  Recent Labs Lab 08/28/12 1910 08/28/12 2120  WBC 3.7* 3.2*  HGB 10.5* 9.6*  HCT 33.9* 30.2*  MCV 92.6 92.4  PLT 132* 112*   Cardiac Enzymes:  Recent Labs Lab 08/28/12 2120 08/29/12 0220 08/29/12 0854  TROPONINI <0.30 <0.30 <0.30   BNP (last 3 results) No results found for this basename: PROBNP,  in the last 8760 hours CBG: No results found for this basename: GLUCAP,  in the last 168 hours  No results found for this or any previous visit (from the past 240 hour(s)).   Studies: Dg Lumbar Spine Complete  08/28/2012   *RADIOLOGY REPORT*  Clinical Data: Lumbar pain  LUMBAR SPINE - COMPLETE 4+ VIEW  Comparison: Lumbar spine films 03/18/2012  Findings: Normal alignment of the lumbar vertebral bodies.  There is loss of disc space height at L2-L3 with endplate spurring.  No acute loss vertebral body height or disc height.  No subluxation. No pars fracture.  IMPRESSION:  1.  No acute findings lumbar spine. 2.  Degenerative disc disease most severe L2-L3, not changed from prior.   Original Report Authenticated By: Genevive Bi, M.D.   Dg Chest Port 1 View  08/28/2012   *RADIOLOGY REPORT*  Clinical Data: Shortness of breath, chest pain  PORTABLE CHEST - 1 VIEW  Comparison: 10/31/2011 cyst  Findings: Normal heart size and vascularity.  Chronic right hemidiaphragm elevation as before.  Stable lung aeration.  No new airspace process, collapse, consolidation, edema, effusion or pneumothorax.  Trachea midline.  Degenerative changes of the right shoulder.  IMPRESSION: Stable exam.  No  superimposed acute process   Original Report Authenticated By: Judie Petit. Shick, M.D.    Scheduled Meds: . feeding supplement  237 mL Oral Q24H  . heparin  5,000 Units Subcutaneous Q8H  . morphine  15 mg Oral Q8H  . multivitamin with minerals  1 tablet Oral Daily  . sodium chloride  3 mL Intravenous Q12H   Continuous Infusions: . sodium chloride 50 mL/hr at 08/29/12 0113    Active Problems:   Chest pain   Anemia   HTN (hypertension)   Acute on chronic renal insufficiency   Other pancytopenia   Low back pain    Time spent: 35 min    Rhonda [name]  [facility] Pager [phone]. If 7PM-7AM, please contact night-coverage at [email or web address], password [facility] 08/29/2012, 2:22 PM  LOS: 1 day

## 2012-08-29 NOTE — Progress Notes (Signed)
PT Cancellation Note  Patient Details Name: CROSBY BEVAN MRN: [id number] DOB: [birth year]   Cancelled Treatment:    Reason Eval/Treat Not Completed: Other (comment) Spoke with pt at bedside and she states she does not want physical therapy here or at home.  Pt reports chronic back pain and therapy aggravates it.  Explained role of PT in acute care however pt continued to decline.  Pt also states she has only fallen once and fall was not "her fault."  Spouse confirms this and states she ambulates well even with her back pain.  PT to sign off.   Vansh Reckart,KATHrine E 08/29/2012, 12:37 PM Zenovia Jarred, PT, DPT 08/29/2012 Pager: [phone]

## 2012-08-29 NOTE — Progress Notes (Signed)
INITIAL NUTRITION ASSESSMENT  DOCUMENTATION CODES Per approved criteria  -Not Applicable   INTERVENTION: Provide Ensure Complete once daily Provide Magic Cup once daily Provide Multivitamin with minerals daily  NUTRITION DIAGNOSIS: Inadequate oral intake related to decreased appetite, pain, nausea as evidenced by 10% wt loss in less than 10 months.   Goal: Pt to meet >/= 90% of their estimated nutrition needs  Monitor:  PO intake Weight Labs  Reason for Assessment: Malnutrition Screening Tool, Score of 3  79 y.o. female  Admitting Dx: Back Pain  ASSESSMENT: 79 y.o. year old female with multiple medical problems including lumbar djd, MI x3 per pt, CKD, schizophrenia, urostomy s/p cystectomy presenting with low back pain, recent CP, and abnormal EKG. Pt initially presented to ER with chief complaint of LBP.  Pt reports that she has had a decreased appetite and decreased PO intake for the past 3-4 months due to pain and nausea. Pt states she feels a little better today and ate most of her breakfast. Per pt she usually weighs 120 lbs.   Height: Ht Readings from Last 1 Encounters:  08/28/12 5\' 5"  (1.651 m)    Weight: Wt Readings from Last 1 Encounters:  08/29/12 111 lb 8.8 oz (50.6 kg)    Ideal Body Weight: 125 lbs  % Ideal Body Weight: 89%  Wt Readings from Last 10 Encounters:  08/29/12 111 lb 8.8 oz (50.6 kg)  11/01/11 124 lb 8 oz (56.473 kg)    Usual Body Weight: 120 lbs  % Usual Body Weight: 93%  BMI:  Body mass index is 18.56 kg/(m^2).  Estimated Nutritional Needs: Kcal: 1420-1520 Protein: 50-60 grams Fluid: 1.5 L  Skin: intact  Diet Order: Cardiac  EDUCATION NEEDS: -No education needs identified at this time   Intake/Output Summary (Last 24 hours) at 08/29/12 1132 Last data filed at 08/29/12 0900  Gross per 24 hour  Intake 881.27 ml  Output    325 ml  Net 556.27 ml    Last BM: 5/27  Labs:   Recent Labs Lab 08/28/12 1910  08/28/12 2120 08/29/12 0220  NA 141  --  139  K 3.3*  --  4.3  CL 115*  --  115*  CO2 16*  --  17*  BUN 18  --  18  CREATININE 1.90* 1.84* 1.75*  CALCIUM 9.6  --  8.8  MG  --  2.1  --   GLUCOSE 102*  --  99    CBG (last 3)  No results found for this basename: GLUCAP,  in the last 72 hours  Scheduled Meds: . heparin  5,000 Units Subcutaneous Q8H  . morphine  15 mg Oral Q8H  . sodium chloride  3 mL Intravenous Q12H    Continuous Infusions: . sodium chloride 50 mL/hr at 08/29/12 0113    Past Medical History  Diagnosis Date  . Hypertension   . Anemia   . Arthritis   . Pneumonia   . Goiter   . Schizo affective schizophrenia   . Myocardial infarct     Past Surgical History  Procedure Laterality Date  . Abdominal hysterectomy    . Back surgery    . Hernia repair    . Cholecystectomy    . Revision urostomy cutaneous     Ian Malkin RD, LDN Inpatient Clinical Dietitian Pager: [phone] After Hours Pager: [phone]

## 2012-08-29 NOTE — Progress Notes (Signed)
Please see Nephrology Consult Note Pt will establish with [facility] for Nephrology F/U at discharge Appointment scheduled for: October 03, 2012 - Arrive at 8:30 AM for 9AM Appt with Dr. Eliott Nine Information will be mailed to patient

## 2012-08-30 ENCOUNTER — Observation Stay (HOSPITAL_COMMUNITY): Payer: Medicare Other

## 2012-08-30 DIAGNOSIS — M545 Low back pain: Secondary | ICD-10-CM

## 2012-08-30 LAB — DIFFERENTIAL
Basophils Absolute: 0 10*3/uL (ref 0.0–0.1)
Basophils Relative: 0 % (ref 0–1)
Eosinophils Absolute: 0 10*3/uL (ref 0.0–0.7)
Monocytes Absolute: 0.3 10*3/uL (ref 0.1–1.0)
Neutro Abs: 2.2 10*3/uL (ref 1.7–7.7)
Neutrophils Relative %: 59 % (ref 43–77)

## 2012-08-30 LAB — IRON AND TIBC: TIBC: 257 ug/dL (ref 250–470)

## 2012-08-30 LAB — VITAMIN B12: Vitamin B-12: 291 pg/mL (ref 211–911)

## 2012-08-30 LAB — CBC
HCT: 30.9 % — ABNORMAL LOW (ref 36.0–46.0)
Hemoglobin: 9.5 g/dL — ABNORMAL LOW (ref 12.0–15.0)
MCH: 29.3 pg (ref 26.0–34.0)
MCHC: 30.7 g/dL (ref 30.0–36.0)

## 2012-08-30 LAB — RENAL FUNCTION PANEL
Albumin: 3.1 g/dL — ABNORMAL LOW (ref 3.5–5.2)
Chloride: 113 mEq/L — ABNORMAL HIGH (ref 96–112)
Creatinine, Ser: 2 mg/dL — ABNORMAL HIGH (ref 0.50–1.10)
GFR calc non Af Amer: 22 mL/min — ABNORMAL LOW (ref >90)
Phosphorus: 3.7 mg/dL (ref 2.3–4.6)

## 2012-08-30 LAB — FERRITIN: Ferritin: 82 ng/mL (ref 10–291)

## 2012-08-30 MED ORDER — MORPHINE SULFATE ER 15 MG PO TBCR: 15.0000 mg | EXTENDED_RELEASE_TABLET | Freq: Three times a day (TID) | ORAL | Status: DC

## 2012-08-30 MED ORDER — ENSURE COMPLETE PO LIQD: 237.0000 mL | ORAL | Status: DC

## 2012-08-30 MED ORDER — HYDROCODONE-ACETAMINOPHEN 5-325 MG PO TABS: 1.0000 | ORAL_TABLET | ORAL | Status: DC | PRN

## 2012-08-30 MED ORDER — SODIUM BICARBONATE 650 MG PO TABS: 1300.0000 mg | ORAL_TABLET | Freq: Two times a day (BID) | ORAL | Status: DC

## 2012-08-30 MED ORDER — LIDOCAINE 5 % EX PTCH
1.0000 | MEDICATED_PATCH | CUTANEOUS | Status: DC
  Administered 2012-08-30: 1 via TRANSDERMAL
  Filled 2012-08-30: qty 1

## 2012-08-30 MED ORDER — LIDOCAINE 5 % EX PTCH: 1.0000 | MEDICATED_PATCH | CUTANEOUS | Status: DC

## 2012-08-30 NOTE — Care Management Note (Signed)
    Page 1 of 1   08/30/2012     3:39:16 PM   CARE MANAGEMENT NOTE 08/30/2012  Patient:  DEANNA, WIATER   Account Number:  [id number]  Date Initiated:  08/30/2012  Documentation initiated by:  Lanier Clam  Subjective/Objective Assessment:   ADMITTED W/PANCYTOPENIA.     Action/Plan:   FROM HOME.HAS PCP,PHARMACY.   Anticipated DC Date:  08/30/2012   Anticipated DC Plan:  HOME/SELF CARE      DC Planning Services  CM consult      Choice offered to / List presented to:             Status of service:  Completed, signed off Medicare Important Message given?   (If response is "NO", the following Medicare IM given date fields will be blank) Date Medicare IM given:   Date Additional Medicare IM given:    Discharge Disposition:  HOME/SELF CARE  Per UR Regulation:  Reviewed for med. necessity/level of care/duration of stay  If discussed at Long Length of Stay Meetings, dates discussed:    Comments:  08/30/12 Deborah Lazcano RN,BSN NCM [phone] PATIENT DECLINED PT TO EVAL.REFUSED MRI YESTERDAY,BUT AGREED TO IT TODAY.NO FURTHER D/C NEEDS, OR ORDERS.

## 2012-08-30 NOTE — Discharge Summary (Signed)
Physician Discharge Summary  Rhonda [name] ZOX:[id number] DOB: May 04, 1930 DOA: 08/28/2012  PCP: No primary provider on file.  Admit date: 08/28/2012 Discharge date: 08/30/2012  Time spent: 35 minutes  Recommendations for Outpatient Follow-up:  1. CBC 1 week with PCP  Discharge Diagnoses:  Active Problems:   Chest pain   Anemia   HTN (hypertension)   Acute on chronic renal insufficiency   Other pancytopenia   Low back pain   Discharge Condition: improved  Diet recommendation: regular  Filed Weights   08/28/12 2221 08/29/12 0408 08/30/12 0545  Weight: 50 kg (110 lb 3.7 oz) 50.6 kg (111 lb 8.8 oz) 52.436 kg (115 lb 9.6 oz)    History of present illness:  Chief Complaint: back pain, recent chest pain, abnormal EKG  History of Present Illness:This is a 79 y.o. year old female with multiple medical problems including lumbar djd, MI x3 per pt, CKD, schizophrenia, urostomy s/p cystectomy presenting with low back pain, recent CP, and abnormal EKG. Pt initially presented to ER with chief complaint of LBP. As a part of her work up, an EKG was obtained that showed new contiguousT wave inversions in V5 and V6 in comparison to old EKG. 1st set of troponins negative. No reported CP at the time.  Hospitalist service was called for admission for CP rule out given prior cardiac history.  In discussion with pt, she has had some central CP over the weekend that was fairly mild in nature with no radiation. Fairly limited history as pt is poor historian. Pt did also have some nausea and diaphoresis around time of CP. CP resolved on its own per pt. CP mildly similar to prior MIs per pt. No active CP currently.  Pt has had her care in the ER both her [place] [facility] and in [place]. Does not have a PCP. Does not have a cardiologist, urologist, or nephrologist for follow up.  In terms of back pain, this is a chronic issue. Has had multiple back surgeries in the past. Pain has progressively worsened over  last 2-3 weeks. No radicular sxs. No known strenuous activity or trauma. Takes MS contin 15mg  tabs tid. This has been minimally-mildly effective in managing pain.    Hospital Course:  Abnormal EKG: no active CP currently. CE enzymes neg. Similar to previous  Low back pain- sounds like sciatica: Acute on chronic issue. L spine films not changed. Has not had MS Contin in 8 months- did better when on; give Percocet for breakthrough pain-  MRI similar to previous MRI -add lidocaine patch   CKD: Stage III-IV CKD. Cr at/near baseline. Renal consult for follow up.   Hypokalemia: replete   Metabolic Acidosis: AG 20. Similar to previous admission. Likely secondary to renal disease. Gentle rehydration and reassess in am. Lactic acid ok   Anemia: baseline hgb 9-11. Hgb near baseline. Likely anemia of renal disease. Continue to follow.    Procedures:  MRI  Consultations:  nephro  Discharge Exam: Filed Vitals:   08/29/12 0408 08/29/12 1419 08/29/12 2117 08/30/12 0545  BP: 143/68 94/48 110/67 100/56  Pulse: 65 57 67 68  Temp: 97.7 F (36.5 C) 98.1 F (36.7 C) 99.8 F (37.7 C) 99.1 F (37.3 C)  TempSrc: Oral Oral Oral Oral  Resp: 16 18 18 18   Height:      Weight: 50.6 kg (111 lb 8.8 oz)   52.436 kg (115 lb 9.6 oz)  SpO2: 100% 99% 99% 99%    General: cooperative, NAD  Cardiovascular: rrr Respiratory: clear anterior  Discharge Instructions      Discharge Orders   Future Orders Complete By Expires     Diet general  As directed     Discharge instructions  As directed     Comments:      Make appointment with PCP    Increase activity slowly  As directed         Medication List    TAKE these medications       feeding supplement Liqd  Take 237 mLs by mouth daily.     HYDROcodone-acetaminophen 5-325 MG per tablet  Commonly known as:  NORCO/VICODIN  Take 1 tablet by mouth every 4 (four) hours as needed.     lidocaine 5 %  Commonly known as:  LIDODERM  Place 1 patch  onto the skin daily. Remove & Discard patch within 12 hours or as directed by MD     morphine 15 MG 12 hr tablet  Commonly known as:  MS CONTIN  Take 1 tablet (15 mg total) by mouth every 8 (eight) hours.     sodium bicarbonate 650 MG tablet  Take 2 tablets (1,300 mg total) by mouth 2 (two) times daily.       Allergies  Allergen Reactions  . Macrodantin (Nitrofurantoin Macrocrystal) Other (See Comments)    "Makes her fall"  . Septra (Sulfamethoxazole W-Trimethoprim) Other (See Comments)    "Makes her fall"  . Soma (Carisoprodol) Other (See Comments)    "Makes her fall"      The results of significant diagnostics from this hospitalization (including imaging, microbiology, ancillary and laboratory) are listed below for reference.    Significant Diagnostic Studies: Dg Lumbar Spine Complete  08/28/2012   *RADIOLOGY REPORT*  Clinical Data: Lumbar pain  LUMBAR SPINE - COMPLETE 4+ VIEW  Comparison: Lumbar spine films 03/18/2012  Findings: Normal alignment of the lumbar vertebral bodies.  There is loss of disc space height at L2-L3 with endplate spurring.  No acute loss vertebral body height or disc height.  No subluxation. No pars fracture.  IMPRESSION:  1.  No acute findings lumbar spine. 2.  Degenerative disc disease most severe L2-L3, not changed from prior.   Original Report Authenticated By: Genevive Bi, M.D.   Mr Lumbar Spine Wo Contrast  08/30/2012   *RADIOLOGY REPORT*  Clinical Data: Low back pain.  History back surgery 30 years ago.  MRI LUMBAR SPINE WITHOUT CONTRAST  Technique:  Multiplanar and multiecho pulse sequences of the lumbar spine were obtained without intravenous contrast.  Comparison: MRI 11/04/2011  Findings: Normal lumbar alignment.  Negative for fracture or mass lesion.  Conus medullaris is normal and terminates at L1-2.  The nerve roots below the L3-4 level are distributed peripherally in the thecal sac.  This is unchanged and most consistent with arachnoiditis.   L1-2:  Mild disc degeneration and mild facet degeneration  L2-3:  Disc degeneration and spondylosis, similar to the prior study.  Bilateral facet hypertrophy and moderate spinal stenosis. There is lateral recess encroachment, right greater than left.  L3-4:  Broad-based disc protrusion and spondylosis.  Facet and ligamentum flavum hypertrophy.  Severe spinal stenosis is unchanged.  Moderate foraminal narrowing bilaterally.  L4-5:  Mild disc and facet degeneration without stenosis.  L5-S1:  Negative  IMPRESSION: Findings are similar to the MRI of 11/04/2011.  Moderate spinal stenosis at L2-3.  Severe spinal stenosis L4-5.  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  Original Report Authenticated By: Janeece Riggers, M.D.   US Renal  08/29/2012   The *RADIOLOGY REPORT*  Clinical Data: Chronic renal disease.  Prior cystectomy.  RENAL/URINARY TRACT ULTRASOUND COMPLETE  Comparison: none  Findings:  Right Kidney = 8.4 cm.  The renal cortex is echogenic mildly thinned.  No hydronephrosis.  Left kidney = 8.3 cm.  Renal cortex is echogenic and mildly thinned.  No hydronephrosis.  Bladder:  Surgically absent.  IMPRESSION:  1.  No evidence of hydronephrosis. 2.  Renal cortical thinning. 3.  Echogenic cortex suggests medical renal disease.   Original Report Authenticated By: Genevive Bi, M.D.   Dg Chest Port 1 View  08/28/2012   *RADIOLOGY REPORT*  Clinical Data: Shortness of breath, chest pain  PORTABLE CHEST - 1 VIEW  Comparison: 10/31/2011 cyst  Findings: Normal heart size and vascularity.  Chronic right hemidiaphragm elevation as before.  Stable lung aeration.  No new airspace process, collapse, consolidation, edema, effusion or pneumothorax.  Trachea midline.  Degenerative changes of the right shoulder.  IMPRESSION: Stable exam.  No superimposed acute process   Original Report Authenticated By: Judie Petit. Miles Costain, M.D.    Microbiology: No results found for this or any previous  visit (from the past 240 hour(s)).   Labs: Basic Metabolic Panel:  Recent Labs Lab 08/28/12 1910 08/28/12 2120 08/29/12 0220 08/30/12 0430  NA 141  --  139 137  K 3.3*  --  4.3 4.6  CL 115*  --  115* 113*  CO2 16*  --  17* 18*  GLUCOSE 102*  --  99 104*  BUN 18  --  18 18  CREATININE 1.90* 1.84* 1.75* 2.00*  CALCIUM 9.6  --  8.8 9.1  MG  --  2.1  --   --   PHOS  --   --   --  3.7   Liver Function Tests:  Recent Labs Lab 08/30/12 0430  ALBUMIN 3.1*   No results found for this basename: LIPASE, AMYLASE,  in the last 168 hours No results found for this basename: AMMONIA,  in the last 168 hours CBC:  Recent Labs Lab 08/28/12 1910 08/28/12 2120 08/30/12 0430  WBC 3.7* 3.2* 3.7*  NEUTROABS  --   --  2.2  HGB 10.5* 9.6* 9.5*  HCT 33.9* 30.2* 30.9*  MCV 92.6 92.4 95.4  PLT 132* 112* 97*   Cardiac Enzymes:  Recent Labs Lab 08/28/12 2120 08/29/12 0220 08/29/12 0854  TROPONINI <0.30 <0.30 <0.30   BNP: BNP (last 3 results) No results found for this basename: PROBNP,  in the last 8760 hours CBG: No results found for this basename: GLUCAP,  in the last 168 hours     Signed:  Marlin Canary  [facility] 08/30/2012, 12:57 PM

## 2012-08-30 NOTE — Progress Notes (Signed)
[facility] PROGRESS NOTE  Rhonda [name]:[id number] DOB: [birth year] DOA: 08/28/2012 PCP: No primary provider on file.  Assessment/Plan: Abnormal EKG: no active CP currently. CE enzymes neg.   Low back pain- sounds like sciatica: Acute on chronic issue. L spine films not changed. Continue MS Contin. Percocet for breakthrough pain- refused MRI yesterday- agreeable today -add lidocaine patch  CKD: Stage III-IV CKD. Cr at/near baseline. Renal consult for follow up.   Hypokalemia: replete   Metabolic Acidosis: AG 20. Similar to previous admission. Likely secondary to renal disease. Gentle rehydration and reassess in am. Lactic acid ok   Anemia: baseline hgb 9-11. Hgb near baseline. Likely anemia of renal disease. Continue to follow.      Code Status: partial Family Communication: patient Disposition Plan:    Consultants:  renal  Procedures:    Antibiotics:    HPI/Subjective: Back pain improved  Objective: Filed Vitals:   08/29/12 0408 08/29/12 1419 08/29/12 2117 08/30/12 0545  BP: 143/68 94/48 110/67 100/56  Pulse: 65 57 67 68  Temp: 97.7 F (36.5 C) 98.1 F (36.7 C) 99.8 F (37.7 C) 99.1 F (37.3 C)  TempSrc: Oral Oral Oral Oral  Resp: 16 18 18 18   Height:      Weight: 50.6 kg (111 lb 8.8 oz)   52.436 kg (115 lb 9.6 oz)  SpO2: 100% 99% 99% 99%    Intake/Output Summary (Last 24 hours) at 08/30/12 1018 Last data filed at 08/30/12 0900  Gross per 24 hour  Intake 1668.34 ml  Output    750 ml  Net 918.34 ml   Filed Weights   08/28/12 2221 08/29/12 0408 08/30/12 0545  Weight: 50 kg (110 lb 3.7 oz) 50.6 kg (111 lb 8.8 oz) 52.436 kg (115 lb 9.6 oz)    Exam:   General:  A+Ox3, chronically ill appearing  Cardiovascular: rrr  Respiratory: clear anterior  Abdomen: +BS, soft  Musculoskeletal: moves all 4 ext   Data Reviewed: Basic Metabolic Panel:  Recent Labs Lab 08/28/12 1910 08/28/12 2120 08/29/12 0220 08/30/12 0430  NA 141  --   139 137  K 3.3*  --  4.3 4.6  CL 115*  --  115* 113*  CO2 16*  --  17* 18*  GLUCOSE 102*  --  99 104*  BUN 18  --  18 18  CREATININE 1.90* 1.84* 1.75* 2.00*  CALCIUM 9.6  --  8.8 9.1  MG  --  2.1  --   --   PHOS  --   --   --  3.7   Liver Function Tests:  Recent Labs Lab 08/30/12 0430  ALBUMIN 3.1*   No results found for this basename: LIPASE, AMYLASE,  in the last 168 hours No results found for this basename: AMMONIA,  in the last 168 hours CBC:  Recent Labs Lab 08/28/12 1910 08/28/12 2120 08/30/12 0430  WBC 3.7* 3.2* 3.7*  NEUTROABS  --   --  2.2  HGB 10.5* 9.6* 9.5*  HCT 33.9* 30.2* 30.9*  MCV 92.6 92.4 95.4  PLT 132* 112* 97*   Cardiac Enzymes:  Recent Labs Lab 08/28/12 2120 08/29/12 0220 08/29/12 0854  TROPONINI <0.30 <0.30 <0.30   BNP (last 3 results) No results found for this basename: PROBNP,  in the last 8760 hours CBG: No results found for this basename: GLUCAP,  in the last 168 hours  No results found for this or any previous visit (from the past 240 hour(s)).   Studies: Dg Lumbar  Spine Complete  08/28/2012   *RADIOLOGY REPORT*  Clinical Data: Lumbar pain  LUMBAR SPINE - COMPLETE 4+ VIEW  Comparison: Lumbar spine films 03/18/2012  Findings: Normal alignment of the lumbar vertebral bodies.  There is loss of disc space height at L2-L3 with endplate spurring.  No acute loss vertebral body height or disc height.  No subluxation. No pars fracture.  IMPRESSION:  1.  No acute findings lumbar spine. 2.  Degenerative disc disease most severe L2-L3, not changed from prior.   Original Report Authenticated By: Genevive Bi, M.D.   US Renal  08/29/2012   The *RADIOLOGY REPORT*  Clinical Data: Chronic renal disease.  Prior cystectomy.  RENAL/URINARY TRACT ULTRASOUND COMPLETE  Comparison: none  Findings:  Right Kidney = 8.4 cm.  The renal cortex is echogenic mildly thinned.  No hydronephrosis.  Left kidney = 8.3 cm.  Renal cortex is echogenic and mildly thinned.   No hydronephrosis.  Bladder:  Surgically absent.  IMPRESSION:  1.  No evidence of hydronephrosis. 2.  Renal cortical thinning. 3.  Echogenic cortex suggests medical renal disease.   Original Report Authenticated By: Genevive Bi, M.D.   Dg Chest Port 1 View  08/28/2012   *RADIOLOGY REPORT*  Clinical Data: Shortness of breath, chest pain  PORTABLE CHEST - 1 VIEW  Comparison: 10/31/2011 cyst  Findings: Normal heart size and vascularity.  Chronic right hemidiaphragm elevation as before.  Stable lung aeration.  No new airspace process, collapse, consolidation, edema, effusion or pneumothorax.  Trachea midline.  Degenerative changes of the right shoulder.  IMPRESSION: Stable exam.  No superimposed acute process   Original Report Authenticated By: Judie Petit. Shick, M.D.    Scheduled Meds: . feeding supplement  237 mL Oral Q24H  . heparin  5,000 Units Subcutaneous Q8H  . morphine  15 mg Oral Q8H  . multivitamin with minerals  1 tablet Oral Daily  . sodium bicarbonate  1,300 mg Oral BID  . sodium chloride  3 mL Intravenous Q12H   Continuous Infusions: . sodium chloride 50 mL/hr at 08/30/12 4540    Active Problems:   Chest pain   Anemia   HTN (hypertension)   Acute on chronic renal insufficiency   Other pancytopenia   Low back pain    Time spent: 35 min    Rhonda [name]  [facility] Pager [phone]. If 7PM-7AM, please contact night-coverage at [email or web address], password [facility] 08/30/2012, 10:18 AM  LOS: 2 days

## 2012-08-31 LAB — UIFE/LIGHT CHAINS/TP QN, 24-HR UR
Free Kappa Lt Chains,Ur: 30.4 mg/dL — ABNORMAL HIGH (ref 0.14–2.42)
Free Kappa/Lambda Ratio: 5.21 ratio (ref 2.04–10.37)
Free Lambda Lt Chains,Ur: 5.83 mg/dL — ABNORMAL HIGH (ref 0.02–0.67)
Total Protein, Urine: 51.5 mg/dL

## 2012-09-03 LAB — PROTEIN ELECTROPHORESIS, SERUM
Albumin ELP: 60.5 % (ref 55.8–66.1)
Beta 2: 3.2 % (ref 3.2–6.5)
Gamma Globulin: 13.3 % (ref 11.1–18.8)
M-Spike, %: NOT DETECTED g/dL

## 2012-09-19 ENCOUNTER — Encounter (HOSPITAL_COMMUNITY): Payer: Self-pay | Admitting: *Deleted

## 2012-09-19 ENCOUNTER — Emergency Department (HOSPITAL_COMMUNITY)
Admission: EM | Admit: 2012-09-19 | Discharge: 2012-09-19 | Disposition: A | Discharge status: Home / Self Care | Payer: Medicare Other | Attending: Emergency Medicine | Admitting: Emergency Medicine

## 2012-09-19 ENCOUNTER — Emergency Department (HOSPITAL_COMMUNITY): Payer: Medicare Other

## 2012-09-19 DIAGNOSIS — Z8639 Personal history of other endocrine, nutritional and metabolic disease: Secondary | ICD-10-CM | POA: Insufficient documentation

## 2012-09-19 DIAGNOSIS — I10 Essential (primary) hypertension: Secondary | ICD-10-CM | POA: Insufficient documentation

## 2012-09-19 DIAGNOSIS — Z8709 Personal history of other diseases of the respiratory system: Secondary | ICD-10-CM | POA: Insufficient documentation

## 2012-09-19 DIAGNOSIS — R443 Hallucinations, unspecified: Secondary | ICD-10-CM | POA: Insufficient documentation

## 2012-09-19 DIAGNOSIS — K439 Ventral hernia without obstruction or gangrene: Secondary | ICD-10-CM | POA: Insufficient documentation

## 2012-09-19 DIAGNOSIS — R911 Solitary pulmonary nodule: Secondary | ICD-10-CM

## 2012-09-19 DIAGNOSIS — R5381 Other malaise: Secondary | ICD-10-CM | POA: Insufficient documentation

## 2012-09-19 DIAGNOSIS — IMO0001 Reserved for inherently not codable concepts without codable children: Secondary | ICD-10-CM | POA: Insufficient documentation

## 2012-09-19 DIAGNOSIS — Z8739 Personal history of other diseases of the musculoskeletal system and connective tissue: Secondary | ICD-10-CM | POA: Insufficient documentation

## 2012-09-19 DIAGNOSIS — A150 Tuberculosis of lung: Secondary | ICD-10-CM | POA: Insufficient documentation

## 2012-09-19 DIAGNOSIS — R509 Fever, unspecified: Secondary | ICD-10-CM | POA: Insufficient documentation

## 2012-09-19 DIAGNOSIS — R109 Unspecified abdominal pain: Secondary | ICD-10-CM

## 2012-09-19 DIAGNOSIS — F3289 Other specified depressive episodes: Secondary | ICD-10-CM | POA: Insufficient documentation

## 2012-09-19 DIAGNOSIS — D72819 Decreased white blood cell count, unspecified: Secondary | ICD-10-CM | POA: Insufficient documentation

## 2012-09-19 DIAGNOSIS — F329 Major depressive disorder, single episode, unspecified: Secondary | ICD-10-CM | POA: Insufficient documentation

## 2012-09-19 DIAGNOSIS — Z90710 Acquired absence of both cervix and uterus: Secondary | ICD-10-CM | POA: Insufficient documentation

## 2012-09-19 DIAGNOSIS — I252 Old myocardial infarction: Secondary | ICD-10-CM | POA: Insufficient documentation

## 2012-09-19 DIAGNOSIS — R350 Frequency of micturition: Secondary | ICD-10-CM | POA: Insufficient documentation

## 2012-09-19 DIAGNOSIS — Z862 Personal history of diseases of the blood and blood-forming organs and certain disorders involving the immune mechanism: Secondary | ICD-10-CM | POA: Insufficient documentation

## 2012-09-19 DIAGNOSIS — Z8659 Personal history of other mental and behavioral disorders: Secondary | ICD-10-CM | POA: Insufficient documentation

## 2012-09-19 LAB — URINALYSIS, ROUTINE W REFLEX MICROSCOPIC
Bilirubin Urine: NEGATIVE
Nitrite: NEGATIVE
Specific Gravity, Urine: 1.014 (ref 1.005–1.030)
Urobilinogen, UA: 0.2 mg/dL (ref 0.0–1.0)

## 2012-09-19 LAB — POCT I-STAT, CHEM 8
Glucose, Bld: 102 mg/dL — ABNORMAL HIGH (ref 70–99)
HCT: 30 % — ABNORMAL LOW (ref 36.0–46.0)
Hemoglobin: 10.2 g/dL — ABNORMAL LOW (ref 12.0–15.0)
Potassium: 3.9 mEq/L (ref 3.5–5.1)
Sodium: 143 mEq/L (ref 135–145)
TCO2: 21 mmol/L (ref 0–100)

## 2012-09-19 LAB — CBC WITH DIFFERENTIAL/PLATELET
Basophils Absolute: 0 10*3/uL (ref 0.0–0.1)
Basophils Relative: 1 % (ref 0–1)
MCHC: 31.8 g/dL (ref 30.0–36.0)
Neutro Abs: 1.4 10*3/uL — ABNORMAL LOW (ref 1.7–7.7)
Neutrophils Relative %: 49 % (ref 43–77)
RDW: 14.8 % (ref 11.5–15.5)

## 2012-09-19 LAB — URINE MICROSCOPIC-ADD ON

## 2012-09-19 MED ORDER — OXYCODONE-ACETAMINOPHEN 5-325 MG PO TABS: 1.0000 | ORAL_TABLET | ORAL | Status: DC | PRN

## 2012-09-19 MED ORDER — HYDROMORPHONE HCL PF 1 MG/ML IJ SOLN
0.5000 mg | Freq: Once | INTRAMUSCULAR | Status: AC
  Administered 2012-09-19: 0.5 mg via INTRAVENOUS
  Filled 2012-09-19: qty 1

## 2012-09-19 MED ORDER — IOHEXOL 300 MG/ML  SOLN: 50.0000 mL | Freq: Once | INTRAMUSCULAR | Status: DC | PRN

## 2012-09-19 MED ORDER — HYDROCODONE-ACETAMINOPHEN 5-325 MG PO TABS: 2.0000 | ORAL_TABLET | ORAL | Status: DC | PRN

## 2012-09-19 NOTE — Consult Note (Signed)
Reason for Consult:abdominal pain Referring Physician: brian [name] [name] is an 79 y.o. female.  HPI: asked to see patient at request of Dr. Hyacinth [name] for abdominal pain. Patient complains of a one-day history of lower abdominal pain around her previous ileal conduit. She has had a hernia around her ileal conduit for at least 10 years. She felt something pop earlier today and had more pain around the ostomy site. She also complains of burning in the ostomy site. The pain is 6/10-8 to 8/10 the patient is in the ostomy area where she is a large parastomal hernia. Patient is moving her bowels. She does have some nausea but no vomiting. No blood in the ostomy or stool.  Past Medical History  Diagnosis Date  . Hypertension   . Anemia   . Arthritis   . Pneumonia   . Goiter   . Schizo affective schizophrenia   . Myocardial infarct     Past Surgical History  Procedure Laterality Date  . Abdominal hysterectomy    . Back surgery    . Hernia repair    . Cholecystectomy    . Revision urostomy cutaneous      Family History  Problem Relation Age of Onset  . Alzheimer's disease Mother     Social History:  reports that she has never smoked. She has never used smokeless tobacco. She reports that she does not drink alcohol or use illicit drugs.  Allergies:  Allergies  Allergen Reactions  . Macrodantin (Nitrofurantoin Macrocrystal) Other (See Comments)    "Makes her fall"  . Septra (Sulfamethoxazole W-Trimethoprim) Other (See Comments)    "Makes her fall"  . Soma (Carisoprodol) Other (See Comments)    "Makes her fall"    Medications: I have reviewed the patient's current medications.  Results for orders placed during the hospital encounter of 09/19/12 (from the past 48 hour(s))  CBC WITH DIFFERENTIAL     Status: Abnormal   Collection Time    09/19/12  6:00 PM      Result Value Range   WBC 2.8 (*) 4.0 - 10.5 K/uL   RBC 3.30 (*) 3.87 - 5.11 MIL/uL   Hemoglobin 9.9 (*) 12.0 -  15.0 g/dL   HCT 16.1 (*) 09.6 - 04.5 %   MCV 94.2  78.0 - 100.0 fL   MCH 30.0  26.0 - 34.0 pg   MCHC 31.8  30.0 - 36.0 g/dL   RDW 40.9  81.1 - 91.4 %   Platelets 146 (*) 150 - 400 K/uL   Neutrophils Relative % 49  43 - 77 %   Neutro Abs 1.4 (*) 1.7 - 7.7 K/uL   Lymphocytes Relative 37  12 - 46 %   Lymphs Abs 1.1  0.7 - 4.0 K/uL   Monocytes Relative 12  3 - 12 %   Monocytes Absolute 0.3  0.1 - 1.0 K/uL   Eosinophils Relative 2  0 - 5 %   Eosinophils Absolute 0.1  0.0 - 0.7 K/uL   Basophils Relative 1  0 - 1 %   Basophils Absolute 0.0  0.0 - 0.1 K/uL  POCT I-STAT, CHEM 8     Status: Abnormal   Collection Time    09/19/12  6:08 PM      Result Value Range   Sodium 143  135 - 145 mEq/L   Potassium 3.9  3.5 - 5.1 mEq/L   Chloride 113 (*) 96 - 112 mEq/L   BUN 16  6 - 23  mg/dL   Creatinine, Ser 1.30 (*) 0.50 - 1.10 mg/dL   Glucose, Bld 865 (*) 70 - 99 mg/dL   Calcium, Ion 7.84 (*) 1.13 - 1.30 mmol/L   TCO2 21  0 - 100 mmol/L   Hemoglobin 10.2 (*) 12.0 - 15.0 g/dL   HCT 69.6 (*) 29.5 - 28.4 %  URINALYSIS, ROUTINE W REFLEX MICROSCOPIC     Status: Abnormal   Collection Time    09/19/12  6:42 PM      Result Value Range   Color, Urine YELLOW  YELLOW   APPearance CLOUDY (*) CLEAR   Specific Gravity, Urine 1.014  1.005 - 1.030   pH 8.0  5.0 - 8.0   Glucose, UA NEGATIVE  NEGATIVE mg/dL   Hgb urine dipstick NEGATIVE  NEGATIVE   Bilirubin Urine NEGATIVE  NEGATIVE   Ketones, ur NEGATIVE  NEGATIVE mg/dL   Protein, ur 132 (*) NEGATIVE mg/dL   Urobilinogen, UA 0.2  0.0 - 1.0 mg/dL   Nitrite NEGATIVE  NEGATIVE   Leukocytes, UA SMALL (*) NEGATIVE  URINE MICROSCOPIC-ADD ON     Status: Abnormal   Collection Time    09/19/12  6:42 PM      Result Value Range   Squamous Epithelial / LPF RARE  RARE   WBC, UA 7-10  <3 WBC/hpf   Bacteria, UA FEW (*) RARE   Urine-Other MUCOUS PRESENT     Comment: AMORPHOUS URATES/PHOSPHATES    Ct Abdomen Pelvis Wo Contrast  09/19/2012   **ADDENDUM**  CREATED: 09/19/2012 20:11:52  There are several staples in the anterior abdominal wall about the hernia site inferiorly.  Fluid density in this area, on images 51 and 52, is not free-flowing and may simply represent fluid within a bowel loop or ileal conduit.  Residual Pantopaque or metal staples are present in the central canal of the lower lumbar spine.  **END ADDENDUM** SIGNED BY: Rhonda Aschoff. [name], M.D.  09/19/2012   *RADIOLOGY REPORT*  Clinical Data: Right sided abdominal pain  CT ABDOMEN AND PELVIS WITHOUT CONTRAST  Technique:  Multidetector CT imaging of the abdomen and pelvis was performed following the standard protocol without intravenous contrast.  Comparison: None.  Findings: Lack of intravenous contrast limits this examination.  Post cholecystectomy.  Simple cyst in the right kidney.  Left kidney is severely atrophic with chronic changes.  Small simple cyst in the left kidney. Calculi are present in the lower pole of the right kidney.  No obvious focal liver mass.  Spleen is unremarkable  Pancreas is atrophic.  Adrenal glands have a nodular appearance and this is of unknown significance.  Both ureters are dilated and extend to the ileal conduit.  A very large ventral abdominal hernia containing several bowel loops, adipose tissue, and the ileal conduit is present.  The bladder has been resected.  Moderate stool burden in the distal colon.  The  No free fluid.  Atherosclerotic faster calcifications are noted.  Advanced degenerative disc disease at L2-3.  Linear and confluent opacities are present at the left lung base. 8 mm left lower lobe pulmonary nodule on image #5.  Nodularity at the right lung base on image #1 of series 3 is noted  IMPRESSION: Status post cystectomy with ileal conduit.  Bilateral hydronephrosis is noted.  Right nephrolithiasis.  Large ventral abdominal hernia containing bowel loops and adipose tissue.  Abnormal lung bases as described.  There is an 8 mm left lower lobe pulmonary  nodule. If the patient is at high risk  for bronchogenic carcinoma, follow-up chest CT at 3-6 months is recommended.  If the patient is at low risk for bronchogenic carcinoma, follow-up chest CT at 6-12 months is recommended.  This recommendation follows the consensus statement: Guidelines for Management of Small Pulmonary Nodules Detected on CT Scans: A Statement from the Fleischner Society as published in Radiology 2005; 237:395-400.   Original Report Authenticated By: Jolaine Click, M.D.    Review of Systems  HENT: Negative.   Eyes: Negative.   Respiratory: Negative.   Cardiovascular: Negative.   Gastrointestinal: Positive for abdominal pain.  Genitourinary: Positive for dysuria.  Musculoskeletal: Positive for myalgias.  Skin: Negative.   Neurological: Positive for weakness.  Endo/Heme/Allergies: Negative.   Psychiatric/Behavioral: Positive for depression and hallucinations. The patient is nervous/anxious.    Blood pressure 124/61, pulse 62, temperature 98.8 F (37.1 C), temperature source Oral, resp. rate 15, SpO2 97.00%. Physical Exam  Constitutional:  Very frail   HENT:  Head: Normocephalic and atraumatic.  Eyes: EOM are normal. Pupils are equal, round, and reactive to light.  Neck: Normal range of motion. Neck supple.  Cardiovascular: Normal rate and regular rhythm.   Respiratory: Effort normal and breath sounds normal.  GI: She exhibits no distension. A hernia is present. Hernia confirmed positive in the ventral area.    Neurological: She is alert.  Skin: Skin is warm and dry.  Psychiatric: Judgment normal. Her mood appears anxious. Her affect is angry. She is agitated and slowed. Cognition and memory are impaired.    Assessment/Plan: Large parastomal hernia WHICH IS REDUCIBLE WITHOUT SIGNS OF PERITONITIS.  By physical exam and by CT scan she has lost abdominal domain and this is not reconstructable. By exam and by reviewing her CT scan I can find no reason for her abdominal  pain except a large hernia which is reducible. She has no signs of peritonitis, rebound or guarding. Unfortunately, I think her pain is secondary from this large hernia. Her ostomy is viable and producing urine. She has no signs of bowel obstruction. She may benefit from referral to a tertiary care center to see if there options or repair of her parastomal hernia.  Oluwatosin Bracy A. 09/19/2012, 9:43 PM

## 2012-09-19 NOTE — ED Provider Notes (Signed)
History     CSN: [id number]  Arrival date & time 09/19/12  1707   First MD Initiated Contact with Patient 09/19/12 1718      Chief Complaint  Patient presents with  . Abdominal Pain    (Consider location/radiation/quality/duration/timing/severity/associated sxs/prior treatment) HPI Comments: Pt is an 79 y/o female with prior hx of Bladder CA s/p cystectomy - diverting urostomy anterior L mid abd who has associated ventral wall hernia - has no pain at baseline who developed acute onset of pain in the LUQ superior to the urostomy bag when she was sitting on the commode yesterday - the pain is constant, severe, improved slightly but is now 9/10, not assoicated with n/v/d.  She has had normal stools, but reports fever to 105 yesterday when family member took temperature.    Patient is a 79 y.o. female presenting with abdominal pain. The history is provided by the patient and the spouse.  Abdominal Pain Associated symptoms include abdominal pain.    Past Medical History  Diagnosis Date  . Hypertension   . Anemia   . Arthritis   . Pneumonia   . Goiter   . Schizo affective schizophrenia   . Myocardial infarct     Past Surgical History  Procedure Laterality Date  . Abdominal hysterectomy    . Back surgery    . Hernia repair    . Cholecystectomy    . Revision urostomy cutaneous      Family History  Problem Relation Age of Onset  . Alzheimer's disease Mother     History  Substance Use Topics  . Smoking status: Never Smoker   . Smokeless tobacco: Never Used  . Alcohol Use: No    OB History   Grav Para Term Preterm Abortions TAB SAB Ect Mult Living                  Review of Systems  Gastrointestinal: Positive for abdominal pain.  All other systems reviewed and are negative.    Allergies  Macrodantin; Septra; and Soma  Home Medications   Current Outpatient Rx  Name  Route  Sig  Dispense  Refill  . morphine (MS CONTIN) 15 MG 12 hr tablet   Oral   Take 1  tablet (15 mg total) by mouth every 8 (eight) hours.   90 tablet   0   . HYDROcodone-acetaminophen (NORCO/VICODIN) 5-325 MG per tablet   Oral   Take 2 tablets by mouth every 4 (four) hours as needed for pain.   10 tablet   0     BP 124/61  Pulse 62  Temp(Src) 98.8 F (37.1 C) (Oral)  Resp 15  SpO2 97%  Physical Exam  Nursing note and vitals reviewed. Constitutional: She appears well-developed and well-nourished. No distress.  HENT:  Head: Normocephalic and atraumatic.  Mouth/Throat: Oropharynx is clear and moist. No oropharyngeal exudate.  Eyes: Conjunctivae and EOM are normal. Pupils are equal, round, and reactive to light. Right eye exhibits no discharge. Left eye exhibits no discharge. No scleral icterus.  Neck: Normal range of motion. Neck supple. No JVD present. No thyromegaly present.  Cardiovascular: Normal rate, regular rhythm, normal heart sounds and intact distal pulses.  Exam reveals no gallop and no friction rub.   No murmur heard. Pulmonary/Chest: Effort normal and breath sounds normal. No respiratory distress. She has no wheezes. She has no rales.  Abdominal: Soft. Bowel sounds are normal. She exhibits no distension and no mass. There is tenderness.  Large  verntral wall hernia on the L of midline - urostomy attached with some cloudy urine, minimal ttp across the upper abdomen.  Assocaited tympanitic sounds to percussion buit very soft and non peritoneal.  No lower abd ttp.  Musculoskeletal: Normal range of motion. She exhibits no edema and no tenderness.  Lymphadenopathy:    She has no cervical adenopathy.  Neurological: She is alert. Coordination normal.  Skin: Skin is warm and dry. No rash noted. No erythema.  Psychiatric: She has a normal mood and affect. Her behavior is normal.    ED Course  Procedures (including critical care time)  Labs Reviewed  CBC WITH DIFFERENTIAL - Abnormal; Notable for the following:    WBC 2.8 (*)    RBC 3.30 (*)    Hemoglobin  9.9 (*)    HCT 31.1 (*)    Platelets 146 (*)    Neutro Abs 1.4 (*)    All other components within normal limits  URINALYSIS, ROUTINE W REFLEX MICROSCOPIC - Abnormal; Notable for the following:    APPearance CLOUDY (*)    Protein, ur 100 (*)    Leukocytes, UA SMALL (*)    All other components within normal limits  URINE MICROSCOPIC-ADD ON - Abnormal; Notable for the following:    Bacteria, UA FEW (*)    All other components within normal limits  POCT I-STAT, CHEM 8 - Abnormal; Notable for the following:    Chloride 113 (*)    Creatinine, Ser 1.70 (*)    Glucose, Bld 102 (*)    Calcium, Ion 1.32 (*)    Hemoglobin 10.2 (*)    HCT 30.0 (*)    All other components within normal limits  URINE CULTURE   Ct Abdomen Pelvis Wo Contrast  09/19/2012   **ADDENDUM** CREATED: 09/19/2012 20:11:52  There are several staples in the anterior abdominal wall about the hernia site inferiorly.  Fluid density in this area, on images 51 and 52, is not free-flowing and may simply represent fluid within a bowel loop or ileal conduit.  Residual Pantopaque or metal staples are present in the central canal of the lower lumbar spine.  **END ADDENDUM** SIGNED BY: Marlowe Aschoff. Hoss, M.D.  09/19/2012   *RADIOLOGY REPORT*  Clinical Data: Right sided abdominal pain  CT ABDOMEN AND PELVIS WITHOUT CONTRAST  Technique:  Multidetector CT imaging of the abdomen and pelvis was performed following the standard protocol without intravenous contrast.  Comparison: None.  Findings: Lack of intravenous contrast limits this examination.  Post cholecystectomy.  Simple cyst in the right kidney.  Left kidney is severely atrophic with chronic changes.  Small simple cyst in the left kidney. Calculi are present in the lower pole of the right kidney.  No obvious focal liver mass.  Spleen is unremarkable  Pancreas is atrophic.  Adrenal glands have a nodular appearance and this is of unknown significance.  Both ureters are dilated and extend to the  ileal conduit.  A very large ventral abdominal hernia containing several bowel loops, adipose tissue, and the ileal conduit is present.  The bladder has been resected.  Moderate stool burden in the distal colon.  The  No free fluid.  Atherosclerotic faster calcifications are noted.  Advanced degenerative disc disease at L2-3.  Linear and confluent opacities are present at the left lung base. 8 mm left lower lobe pulmonary nodule on image #5.  Nodularity at the right lung base on image #1 of series 3 is noted  IMPRESSION: Status post cystectomy with ileal  conduit.  Bilateral hydronephrosis is noted.  Right nephrolithiasis.  Large ventral abdominal hernia containing bowel loops and adipose tissue.  Abnormal lung bases as described.  There is an 8 mm left lower lobe pulmonary nodule. If the patient is at high risk for bronchogenic carcinoma, follow-up chest CT at 3-6 months is recommended.  If the patient is at low risk for bronchogenic carcinoma, follow-up chest CT at 6-12 months is recommended.  This recommendation follows the consensus statement: Guidelines for Management of Small Pulmonary Nodules Detected on CT Scans: A Statement from the Fleischner Society as published in Radiology 2005; 237:395-400.   Original Report Authenticated By: Jolaine Click, M.D.     1. Abdominal  pain, other specified site   2. Leukopenia   3. Lung nodule       MDM  The pt does not appear to be in distress, initial VS with no fever or tachycardia, normal BP - evaluate for possible post surgical complication s/a incarcerated hernia, adhesions / SBO.  Not nauseated - pain meds ordered.  Check UA - has not had UTI in > 1 year, not on suppressive therapy - states that they tried a neobladder out of bowel but didn't work.   D/w Dr. Luisa Hart of Gen Surgery agrees this is not a surgical case - she has a non surgical abd and can be d/c safely.  Labs reviewed, no other acute findings.  No UTI and no leukocytosis.  Pt informed of  results.   Meds given in ED:  Medications  iohexol (OMNIPAQUE) 300 MG/ML solution 50 mL (not administered)  HYDROmorphone (DILAUDID) injection 0.5 mg (0.5 mg Intravenous Given 09/19/12 1757)  HYDROmorphone (DILAUDID) injection 0.5 mg (0.5 mg Intravenous Given 09/19/12 2027)    New Prescriptions   HYDROCODONE-ACETAMINOPHEN (NORCO/VICODIN) 5-325 MG PER TABLET    Take 2 tablets by mouth every 4 (four) hours as needed for pain.           Vida Roller, MD 09/19/12 2230

## 2012-09-19 NOTE — ED Notes (Signed)
Pt states she sat down to use the bathroom this morning when she heard a R sided abdominal "pop", developed sharp pain to R side of abdomen above stoma area, pt states also felt a knot that is now gone she states, pt states still having R sided abdominal pain 9/10, sharp and burning, denies n/v/d. Pt states she's having back pain but has a hx of back pain from previous surgeries.

## 2012-09-21 LAB — URINE CULTURE

## 2012-09-22 NOTE — Progress Notes (Signed)
ED Antimicrobial Stewardship Positive Culture Follow Up   Rhonda [name] is an 79 y.o. female who presented to [facility] on 09/19/2012 with a chief complaint of  Chief Complaint  Patient presents with  . Abdominal Pain    Recent Results (from the past 720 hour(s))  URINE CULTURE     Status: None   Collection Time    09/19/12  6:42 PM      Result Value Range Status   Specimen Description URINE, RANDOM   Final   Special Requests NONE   Final   Culture  Setup Time 09/19/2012 23:22   Final   Colony Count >=100,000 COLONIES/ML   Final   Culture KLEBSIELLA PNEUMONIAE   Final   Report Status 09/21/2012 FINAL   Final   Organism ID, Bacteria KLEBSIELLA PNEUMONIAE   Final     [x]  Patient discharged originally without antimicrobial agent and treatment is now indicated, urinalysis grossly positive for UTI  New antibiotic prescription: cephalexin 500 TID x 10 days  ED Provider: Arthor Captain, PA-C   [name] 09/22/2012, 1:12 PM Infectious Diseases Pharmacist Phone# [phone]

## 2012-09-23 ENCOUNTER — Telehealth (HOSPITAL_COMMUNITY): Payer: Self-pay | Admitting: Emergency Medicine

## 2012-09-23 NOTE — ED Notes (Signed)
Post ED Visit - Positive Culture Follow-up: Successful Patient Follow-Up  Culture assessed and recommendations reviewed by: []  Wes [place], Pharm.D., BCPS []  Celedonio Miyamoto, Pharm.D., BCPS []  Georgina Pillion, Pharm.D., BCPS []  [place], [address].D., BCPS, AAHIVP []  Estella Husk, Pharm.D., BCPS, AAHIVP [x]  [name], [address].D., BCPS  Positive urine culture  [x]  Patient discharged without antimicrobial prescription and treatment is now indicated []  Organism is resistant to prescribed ED discharge antimicrobial []  Patient with positive blood cultures  Changes discussed with ED provider: Arthor Captain PA-C New antibiotic prescription: Cephalexin 500 mg TID x 10 days    Kylie A Holland 09/23/2012, 12:45 PM

## 2012-09-23 NOTE — ED Notes (Signed)
Left message for pt to return call.

## 2012-09-26 ENCOUNTER — Telehealth (HOSPITAL_COMMUNITY): Payer: Self-pay | Admitting: *Deleted

## 2012-09-26 NOTE — ED Notes (Signed)
Rx called to pharmacy by [facility].

## 2012-10-05 ENCOUNTER — Emergency Department (HOSPITAL_COMMUNITY)
Admission: EM | Admit: 2012-10-05 | Discharge: 2012-10-05 | Disposition: A | Discharge status: Home / Self Care | Payer: Medicare Other | Attending: Emergency Medicine | Admitting: Emergency Medicine

## 2012-10-05 ENCOUNTER — Emergency Department (HOSPITAL_COMMUNITY): Payer: Medicare Other

## 2012-10-05 ENCOUNTER — Encounter (HOSPITAL_COMMUNITY): Payer: Self-pay | Admitting: *Deleted

## 2012-10-05 DIAGNOSIS — I252 Old myocardial infarction: Secondary | ICD-10-CM | POA: Insufficient documentation

## 2012-10-05 DIAGNOSIS — I10 Essential (primary) hypertension: Secondary | ICD-10-CM | POA: Insufficient documentation

## 2012-10-05 DIAGNOSIS — Z8701 Personal history of pneumonia (recurrent): Secondary | ICD-10-CM | POA: Insufficient documentation

## 2012-10-05 DIAGNOSIS — Z8639 Personal history of other endocrine, nutritional and metabolic disease: Secondary | ICD-10-CM | POA: Insufficient documentation

## 2012-10-05 DIAGNOSIS — Z79899 Other long term (current) drug therapy: Secondary | ICD-10-CM | POA: Insufficient documentation

## 2012-10-05 DIAGNOSIS — Z8739 Personal history of other diseases of the musculoskeletal system and connective tissue: Secondary | ICD-10-CM | POA: Insufficient documentation

## 2012-10-05 DIAGNOSIS — N390 Urinary tract infection, site not specified: Secondary | ICD-10-CM

## 2012-10-05 DIAGNOSIS — Z862 Personal history of diseases of the blood and blood-forming organs and certain disorders involving the immune mechanism: Secondary | ICD-10-CM | POA: Insufficient documentation

## 2012-10-05 LAB — CBC WITH DIFFERENTIAL/PLATELET
Basophils Absolute: 0 10*3/uL (ref 0.0–0.1)
Basophils Relative: 0 % (ref 0–1)
Eosinophils Absolute: 0.1 10*3/uL (ref 0.0–0.7)
Hemoglobin: 10 g/dL — ABNORMAL LOW (ref 12.0–15.0)
Lymphocytes Relative: 34 % (ref 12–46)
MCHC: 31.5 g/dL (ref 30.0–36.0)
Monocytes Absolute: 0.2 10*3/uL (ref 0.1–1.0)
Neutrophils Relative %: 55 % (ref 43–77)
Platelets: 83 10*3/uL — ABNORMAL LOW (ref 150–400)
RDW: 14.5 % (ref 11.5–15.5)

## 2012-10-05 LAB — URINALYSIS, ROUTINE W REFLEX MICROSCOPIC
Bilirubin Urine: NEGATIVE
Ketones, ur: NEGATIVE mg/dL
Nitrite: NEGATIVE
Protein, ur: 100 mg/dL — AB
Urobilinogen, UA: 0.2 mg/dL (ref 0.0–1.0)

## 2012-10-05 LAB — RAPID URINE DRUG SCREEN, HOSP PERFORMED
Amphetamines: NOT DETECTED
Barbiturates: NOT DETECTED
Benzodiazepines: POSITIVE — AB
Cocaine: NOT DETECTED

## 2012-10-05 LAB — COMPREHENSIVE METABOLIC PANEL
ALT: 8 U/L (ref 0–35)
AST: 15 U/L (ref 0–37)
Calcium: 9.7 mg/dL (ref 8.4–10.5)
Creatinine, Ser: 1.85 mg/dL — ABNORMAL HIGH (ref 0.50–1.10)
Sodium: 136 mEq/L (ref 135–145)
Total Protein: 6.4 g/dL (ref 6.0–8.3)

## 2012-10-05 LAB — ETHANOL: Alcohol, Ethyl (B): <11 mg/dL (ref 0–11)

## 2012-10-05 MED ORDER — CEPHALEXIN 250 MG PO CAPS: 500.0000 mg | ORAL_CAPSULE | Freq: Four times a day (QID) | ORAL | Status: DC

## 2012-10-05 MED ORDER — SODIUM CHLORIDE 0.9 % IV BOLUS (SEPSIS)
500.0000 mL | Freq: Once | INTRAVENOUS | Status: AC
  Administered 2012-10-05: 500 mL via INTRAVENOUS

## 2012-10-05 MED ORDER — OXYCODONE-ACETAMINOPHEN 5-325 MG PO TABS
2.0000 | ORAL_TABLET | Freq: Once | ORAL | Status: AC
  Administered 2012-10-05: 2 via ORAL
  Filled 2012-10-05: qty 2

## 2012-10-05 MED ORDER — CEPHALEXIN 500 MG PO CAPS
500.0000 mg | ORAL_CAPSULE | Freq: Once | ORAL | Status: AC
  Administered 2012-10-05: 500 mg via ORAL
  Filled 2012-10-05: qty 1

## 2012-10-05 NOTE — ED Notes (Signed)
Pt transferred to x ray
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
## 2012-10-05 NOTE — ED Notes (Signed)
Will discharge pt after fluids complete

## 2012-10-07 LAB — URINE CULTURE: Colony Count: >=100000

## 2013-01-05 ENCOUNTER — Encounter (HOSPITAL_COMMUNITY): Payer: Self-pay | Admitting: *Deleted

## 2013-01-05 ENCOUNTER — Emergency Department (HOSPITAL_COMMUNITY): Payer: Medicare Other

## 2013-01-05 ENCOUNTER — Inpatient Hospital Stay (HOSPITAL_COMMUNITY)
Admission: EM | Admit: 2013-01-05 | Discharge: 2013-01-12 | DRG: 690 | Disposition: A | Discharge status: Home Health | Payer: Medicare Other | Attending: Internal Medicine | Admitting: Internal Medicine

## 2013-01-05 DIAGNOSIS — E440 Moderate protein-calorie malnutrition: Secondary | ICD-10-CM | POA: Diagnosis present

## 2013-01-05 DIAGNOSIS — R443 Hallucinations, unspecified: Secondary | ICD-10-CM | POA: Diagnosis present

## 2013-01-05 DIAGNOSIS — E872 Acidosis, unspecified: Secondary | ICD-10-CM | POA: Diagnosis present

## 2013-01-05 DIAGNOSIS — R627 Adult failure to thrive: Secondary | ICD-10-CM | POA: Diagnosis present

## 2013-01-05 DIAGNOSIS — N390 Urinary tract infection, site not specified: Principal | ICD-10-CM

## 2013-01-05 DIAGNOSIS — D631 Anemia in chronic kidney disease: Secondary | ICD-10-CM | POA: Diagnosis present

## 2013-01-05 DIAGNOSIS — B9620 Unspecified Escherichia coli [E. coli] as the cause of diseases classified elsewhere: Secondary | ICD-10-CM | POA: Diagnosis present

## 2013-01-05 DIAGNOSIS — R079 Chest pain, unspecified: Secondary | ICD-10-CM

## 2013-01-05 DIAGNOSIS — N179 Acute kidney failure, unspecified: Secondary | ICD-10-CM | POA: Diagnosis present

## 2013-01-05 DIAGNOSIS — G8929 Other chronic pain: Secondary | ICD-10-CM | POA: Diagnosis present

## 2013-01-05 DIAGNOSIS — F29 Unspecified psychosis not due to a substance or known physiological condition: Secondary | ICD-10-CM

## 2013-01-05 DIAGNOSIS — N189 Chronic kidney disease, unspecified: Secondary | ICD-10-CM | POA: Diagnosis present

## 2013-01-05 DIAGNOSIS — D696 Thrombocytopenia, unspecified: Secondary | ICD-10-CM | POA: Diagnosis present

## 2013-01-05 DIAGNOSIS — I951 Orthostatic hypotension: Secondary | ICD-10-CM

## 2013-01-05 DIAGNOSIS — R109 Unspecified abdominal pain: Secondary | ICD-10-CM

## 2013-01-05 DIAGNOSIS — M549 Dorsalgia, unspecified: Secondary | ICD-10-CM | POA: Diagnosis present

## 2013-01-05 DIAGNOSIS — Z681 Body mass index (BMI) 19 or less, adult: Secondary | ICD-10-CM

## 2013-01-05 DIAGNOSIS — F22 Delusional disorders: Secondary | ICD-10-CM

## 2013-01-05 DIAGNOSIS — D649 Anemia, unspecified: Secondary | ICD-10-CM

## 2013-01-05 DIAGNOSIS — I129 Hypertensive chronic kidney disease with stage 1 through stage 4 chronic kidney disease, or unspecified chronic kidney disease: Secondary | ICD-10-CM | POA: Diagnosis present

## 2013-01-05 DIAGNOSIS — I252 Old myocardial infarction: Secondary | ICD-10-CM

## 2013-01-05 DIAGNOSIS — A498 Other bacterial infections of unspecified site: Secondary | ICD-10-CM | POA: Diagnosis present

## 2013-01-05 DIAGNOSIS — N289 Disorder of kidney and ureter, unspecified: Secondary | ICD-10-CM

## 2013-01-05 DIAGNOSIS — F259 Schizoaffective disorder, unspecified: Secondary | ICD-10-CM | POA: Diagnosis present

## 2013-01-05 HISTORY — DX: Other chronic pain: G89.29

## 2013-01-05 HISTORY — DX: Ventral hernia without obstruction or gangrene: K43.9

## 2013-01-05 HISTORY — DX: Dorsalgia, unspecified: M54.9

## 2013-01-05 LAB — URINALYSIS W MICROSCOPIC + REFLEX CULTURE
Ketones, ur: NEGATIVE mg/dL
Nitrite: NEGATIVE
Protein, ur: 100 mg/dL — AB
pH: 6.5 (ref 5.0–8.0)

## 2013-01-05 LAB — HEPATIC FUNCTION PANEL
Albumin: 3.5 g/dL (ref 3.5–5.2)
Total Bilirubin: 0.5 mg/dL (ref 0.3–1.2)

## 2013-01-05 LAB — BASIC METABOLIC PANEL
Chloride: 113 mEq/L — ABNORMAL HIGH (ref 96–112)
Creatinine, Ser: 1.68 mg/dL — ABNORMAL HIGH (ref 0.50–1.10)
GFR calc Af Amer: 32 mL/min — ABNORMAL LOW (ref >90)
Potassium: 3.7 mEq/L (ref 3.5–5.1)
Sodium: 140 mEq/L (ref 135–145)

## 2013-01-05 LAB — LIPID PANEL
Cholesterol: 131 mg/dL (ref 0–200)
LDL Cholesterol: 71 mg/dL (ref 0–99)
Triglycerides: 126 mg/dL (ref <150)
VLDL: 25 mg/dL (ref 0–40)

## 2013-01-05 LAB — TSH: TSH: 1.041 u[IU]/mL (ref 0.350–4.500)

## 2013-01-05 LAB — POCT I-STAT TROPONIN I: Troponin i, poc: 0.04 ng/mL (ref 0.00–0.08)

## 2013-01-05 LAB — LIPASE, BLOOD: Lipase: 77 U/L — ABNORMAL HIGH (ref 11–59)

## 2013-01-05 LAB — RAPID URINE DRUG SCREEN, HOSP PERFORMED
Amphetamines: NOT DETECTED
Benzodiazepines: NOT DETECTED
Opiates: POSITIVE — AB
Tetrahydrocannabinol: NOT DETECTED

## 2013-01-05 LAB — CBC
MCV: 92.6 fL (ref 78.0–100.0)
Platelets: 158 10*3/uL (ref 150–400)
RDW: 16 % — ABNORMAL HIGH (ref 11.5–15.5)
WBC: 4.9 10*3/uL (ref 4.0–10.5)

## 2013-01-05 LAB — TROPONIN I: Troponin I: <0.3 ng/mL (ref <0.30)

## 2013-01-05 LAB — ETHANOL: Alcohol, Ethyl (B): <11 mg/dL (ref 0–11)

## 2013-01-05 LAB — PHOSPHORUS: Phosphorus: 3.8 mg/dL (ref 2.3–4.6)

## 2013-01-05 MED ORDER — HYDROCODONE-ACETAMINOPHEN 5-325 MG PO TABS
1.0000 | ORAL_TABLET | ORAL | Status: DC | PRN
  Administered 2013-01-05 – 2013-01-11 (×5): 2 via ORAL
  Filled 2013-01-05 (×7): qty 2

## 2013-01-05 MED ORDER — ENOXAPARIN SODIUM 30 MG/0.3ML ~~LOC~~ SOLN
30.0000 mg | SUBCUTANEOUS | Status: DC
  Administered 2013-01-05 – 2013-01-06 (×2): 30 mg via SUBCUTANEOUS
  Filled 2013-01-05 (×3): qty 0.3

## 2013-01-05 MED ORDER — FAMOTIDINE 20 MG PO TABS
40.0000 mg | ORAL_TABLET | Freq: Once | ORAL | Status: AC
  Administered 2013-01-05: 40 mg via ORAL
  Filled 2013-01-05: qty 2

## 2013-01-05 MED ORDER — MORPHINE SULFATE 4 MG/ML IJ SOLN
4.0000 mg | INTRAMUSCULAR | Status: DC | PRN
  Administered 2013-01-05: 4 mg via INTRAVENOUS
  Filled 2013-01-05: qty 1

## 2013-01-05 MED ORDER — SODIUM CHLORIDE 0.9 % IV SOLN
INTRAVENOUS | Status: AC
  Administered 2013-01-05: 1 mL via INTRAVENOUS

## 2013-01-05 MED ORDER — ASPIRIN 325 MG PO TABS
325.0000 mg | ORAL_TABLET | Freq: Every day | ORAL | Status: DC
  Administered 2013-01-05 – 2013-01-12 (×8): 325 mg via ORAL
  Filled 2013-01-05 (×8): qty 1

## 2013-01-05 MED ORDER — INFLUENZA VAC SPLIT QUAD 0.5 ML IM SUSP
0.5000 mL | INTRAMUSCULAR | Status: AC
  Administered 2013-01-06: 0.5 mL via INTRAMUSCULAR
  Filled 2013-01-05 (×2): qty 0.5

## 2013-01-05 MED ORDER — HYDROMORPHONE HCL PF 1 MG/ML IJ SOLN
1.0000 mg | INTRAMUSCULAR | Status: DC | PRN
  Administered 2013-01-05 – 2013-01-07 (×6): 1 mg via INTRAVENOUS
  Filled 2013-01-05 (×7): qty 1

## 2013-01-05 MED ORDER — ONDANSETRON HCL 4 MG/2ML IJ SOLN: 4.0000 mg | Freq: Four times a day (QID) | INTRAMUSCULAR | Status: DC | PRN

## 2013-01-05 MED ORDER — SODIUM CHLORIDE 0.9 % IJ SOLN
3.0000 mL | Freq: Two times a day (BID) | INTRAMUSCULAR | Status: DC
  Administered 2013-01-07 – 2013-01-12 (×5): 3 mL via INTRAVENOUS

## 2013-01-05 MED ORDER — ONDANSETRON HCL 4 MG PO TABS: 4.0000 mg | ORAL_TABLET | Freq: Four times a day (QID) | ORAL | Status: DC | PRN

## 2013-01-05 MED ORDER — GI COCKTAIL ~~LOC~~
30.0000 mL | Freq: Once | ORAL | Status: AC
  Administered 2013-01-05: 30 mL via ORAL
  Filled 2013-01-05: qty 30

## 2013-01-05 NOTE — ED Notes (Addendum)
Pt reports increased weakness in the last week, with dizziness and chronic back pain. Reports chest pain and right shoulder pain 9/10 started this morning. Hx of 4 MIs. Hx of 2 back surgeries and anemia.   Reports she had a fever yesterday, but took aspirin.

## 2013-01-05 NOTE — H&P (Signed)
[facility] History and Physical  Rhonda [name]:[id number] DOB: 01/16/1931 DOA: 01/05/2013  Referring physician: ED physician PCP: No primary provider on file.   Chief Complaint: Abdominal pain   HPI:  Pt is 79 yo female who presented to [facility] ED with main concern of persistent abdominal pain, mostly located in the upper quadrants, LUQ > RUQ, 5/10 in severity and with no specific alleviating or aggravating factors. Please note that pt provides somewhat conflicting information and intermittently used chest pain and abdominal pain and on specific questioning at one point denies both. Pt is overall poor historian. She describes chest discomfort, substernal, no specific alleviating or aggravating factors. She is not sure of the exact duration.   In ED, pt is clinically stable, on blood work, lipase elevated and found to be in acute renal failure. [facility] asked to admit for management of presumptive acute pancreatitis and chest pain work up.   Assessment and Plan: Abdominal pain - unclear etiology at this time and possibly releated to acute pancreatitis - will place order for abdominal US as CT abdomen can not be done due to ARF - will place on telemetry and provide supportive care with IVF, analgesia and antiemetics as needed - check lipase in AM Chest pain - will monitor on telemetry - cycle CE's, check TSH and 12 lead EKG - provide analgesia and oxygen as needed ARF - likely secondary to pre renal etiology  - will place on IVF and will repeat BMP in AM  Code Status: Full Family Communication: Pt at bedside Disposition Plan: PT evaluation    Review of Systems:  Constitutional: Negative for fever, chills and malaise/fatigue. Negative for diaphoresis.  HENT: Negative for hearing loss, ear pain, nosebleeds, congestion, sore throat, neck pain, tinnitus and ear discharge.   Eyes: Negative for blurred vision, double vision, photophobia, pain, discharge and redness.  Respiratory:  Negative for cough, hemoptysis, sputum production, shortness of breath, wheezing and stridor.   Cardiovascular: Negative for palpitations, orthopnea, claudication and leg swelling.  Gastrointestinal: Negative for heartburn, constipation, blood in stool and melena.  Genitourinary: Negative for dysuria, urgency, frequency, hematuria and flank pain.  Musculoskeletal: Negative for myalgias, back pain, joint pain and falls.  Skin: Negative for itching and rash.  Neurological:  Negative for tingling, tremors, sensory change, speech change, focal weakness, loss of consciousness and headaches.  Endo/Heme/Allergies: Negative for environmental allergies and polydipsia. Does not bruise/bleed easily.  Psychiatric/Behavioral: Negative for suicidal ideas. The patient is not nervous/anxious.      Past Medical History  Diagnosis Date  . Hypertension   . Anemia   . Arthritis   . Pneumonia   . Goiter   . Schizo affective schizophrenia   . Myocardial infarct   . Chronic back pain   . Ventral hernia     Past Surgical History  Procedure Laterality Date  . Abdominal hysterectomy    . Back surgery    . Hernia repair    . Cholecystectomy    . Revision urostomy cutaneous      Social History:  reports that she has never smoked. She has never used smokeless tobacco. She reports that she does not drink alcohol or use illicit drugs.  Allergies  Allergen Reactions  . Macrodantin [Nitrofurantoin Macrocrystal] Other (See Comments)    "Makes her fall"  . Septra [Sulfamethoxazole-Trimethoprim] Other (See Comments)    "Makes her fall"  . Soma [Carisoprodol] Other (See Comments)    "Makes her fall"    Family History  Problem Relation Age of Onset  . Alzheimer's disease Mother     Prior to Admission medications   Medication Sig Start Date End Date Taking? Authorizing Provider  aspirin 325 MG tablet Take 325 mg by mouth daily.   Yes Historical Provider, MD  morphine (MS CONTIN) 15 MG 12 hr tablet Take  1 tablet (15 mg total) by mouth every 8 (eight) hours. 08/30/12  Yes Joseph Art, DO    Physical Exam: Filed Vitals:   01/05/13 1125 01/05/13 1340 01/05/13 1347 01/05/13 1354  BP: 111/69 131/67 114/61 108/61  Pulse: 78 66 72 85  Temp: 97.6 F (36.4 C)     TempSrc: Oral     Resp: 16 16 17    SpO2: 97% 94% 97%     Physical Exam  Constitutional: Appears well-developed and well-nourished. No distress.  HENT: Normocephalic. External right and left ear normal. Oropharynx is clear and moist.  Eyes: Conjunctivae and EOM are normal. PERRLA, no scleral icterus.  Neck: Normal ROM. Neck supple. No JVD. No tracheal deviation. No thyromegaly.  CVS: RRR, S1/S2 +, no murmurs, no gallops, no carotid bruit.  Pulmonary: Effort and breath sounds normal, no stridor, rhonchi, wheezes, rales.  Abdominal: Soft. BS +,  no distension, tenderness in epigastric area, no rebound or guarding.  Musculoskeletal: Normal range of motion. No edema and no tenderness.  Lymphadenopathy: No lymphadenopathy noted, cervical, inguinal. Neuro: Alert. Normal reflexes, muscle tone coordination. No cranial nerve deficit. Skin: Skin is warm and dry. No rash noted. Not diaphoretic. No erythema. No pallor.  Psychiatric: Normal mood and affect. Behavior, judgment, thought content normal.   Labs on Admission:  Basic Metabolic Panel:  Recent Labs Lab 01/05/13 1227  NA 140  K 3.7  CL 113*  CO2 13*  GLUCOSE 100*  BUN 23  CREATININE 1.68*  CALCIUM 9.5   Liver Function Tests:  Recent Labs Lab 01/05/13 1227  AST 20  ALT 15  ALKPHOS 62  BILITOT 0.5  PROT 6.5  ALBUMIN 3.5    Recent Labs Lab 01/05/13 1227  LIPASE 77*   CBC:  Recent Labs Lab 01/05/13 1227  WBC 4.9  HGB 11.1*  HCT 33.6*  MCV 92.6  PLT 158   CBG:  Recent Labs Lab 01/05/13 1123  GLUCAP 111*    Radiological Exams on Admission: Dg Abd Acute W/chest  01/05/2013   Mild lung hyperexpansion without acute cardiopulmonary disease. Paucity  of bowel gas without evidence of obstruction.     EKG: Normal sinus rhythm, no ST/T wave changes  Debbora Presto, MD  [facility] Pager [phone]  If 7PM-7AM, please contact night-coverage [email or web address] Password [email or web address] 01/05/2013, 3:06 PM

## 2013-01-05 NOTE — ED Provider Notes (Signed)
CSN: [id number]     Arrival date & time 01/05/13  1109 History   First MD Initiated Contact with Patient 01/05/13 1227     Chief Complaint  Patient presents with  . Chest Pain  . Abdominal Pain  . Fatigue    HPI Pt was seen at 1245. Per pt and her son, c/o gradual onset and worsening of persistent generalized weakness for the past 1 week. States weakness worsens with attempting to stand/ambulate. Pt states this morning PTA, pt began to have lower mid-sternal chest "pain" associated with nausea and SOB. Describes the CP as "aching." Pt states it "might be my anemia." Denies palpitations, no vomiting/diarrhea, no cough, no fevers.    Past Medical History  Diagnosis Date  . Hypertension   . Anemia   . Arthritis   . Pneumonia   . Goiter   . Schizo affective schizophrenia   . Myocardial infarct   . Chronic back pain   . Ventral hernia    Past Surgical History  Procedure Laterality Date  . Abdominal hysterectomy    . Back surgery    . Hernia repair    . Cholecystectomy    . Revision urostomy cutaneous     Family History  Problem Relation Age of Onset  . Alzheimer's disease Mother    History  Substance Use Topics  . Smoking status: Never Smoker   . Smokeless tobacco: Never Used  . Alcohol Use: No    Review of Systems ROS: Statement: All systems negative except as marked or noted in the HPI; Constitutional: Negative for fever and chills. ; ; Eyes: Negative for eye pain, redness and discharge. ; ; ENMT: Negative for ear pain, hoarseness, nasal congestion, sinus pressure and sore throat. ; ; Cardiovascular: +chest pain, SOB. Negative for palpitations, diaphoresis, and peripheral edema. ; ; Respiratory: Negative for cough, wheezing and stridor. ; ; Gastrointestinal: +nausea. Negative for vomiting, diarrhea, blood in stool, hematemesis, jaundice and rectal bleeding. . ; ; Genitourinary: Negative for dysuria, flank pain and hematuria. ; ; Musculoskeletal: Negative for back pain and  neck pain. Negative for swelling and trauma.; ; Skin: Negative for pruritus, rash, abrasions, blisters, bruising and skin lesion.; ; Neuro: +generalized weakness. Negative for headache, lightheadedness and neck stiffness. Negative for altered level of consciousness , altered mental status, extremity weakness, paresthesias, involuntary movement, seizure and syncope.      Allergies  Macrodantin; Septra; and Soma  Home Medications   Current Outpatient Rx  Name  Route  Sig  Dispense  Refill  . aspirin 325 MG tablet   Oral   Take 325 mg by mouth daily.         Marland Kitchen morphine (MS CONTIN) 15 MG 12 hr tablet   Oral   Take 1 tablet (15 mg total) by mouth every 8 (eight) hours.   90 tablet   0    BP 108/61  Pulse 85  Temp(Src) 97.6 F (36.4 C) (Oral)  Resp 17  SpO2 97% Physical Exam 1250: Physical examination:  Nursing notes reviewed; Vital signs and O2 SAT reviewed;  Constitutional: Well developed, Well nourished, In no acute distress; Head:  Normocephalic, atraumatic; Eyes: EOMI, PERRL, No scleral icterus; ENMT: Mouth and pharynx normal, Mucous membranes dry; Neck: Supple, Full range of motion, No lymphadenopathy; Cardiovascular: Regular rate and rhythm, No gallop; Respiratory: Breath sounds clear & equal bilaterally, No wheezes.  Speaking full sentences with ease, Normal respiratory effort/excursion; Chest: Nontender, Movement normal; Abdomen: Soft, +mild mid-epigastric tenderness to palp.  No rebound or guarding. Nondistended, Normal bowel sounds; Genitourinary: No CVA tenderness; Extremities: Pulses normal, No tenderness, No edema, No calf edema or asymmetry.; Neuro: AA&Ox3, vague rambling historian. Major CN grossly intact.  Speech clear. No gross focal motor or sensory deficits in extremities.; Skin: Color pale. Warm, Dry.   ED Course  Procedures     MDM  MDM Reviewed: previous chart, nursing note and vitals Reviewed previous: labs and ECG Interpretation: labs, ECG and  x-ray      Date: 01/05/2013  Rate: 72  Rhythm: normal sinus rhythm  QRS Axis: normal  Intervals: normal  ST/T Wave abnormalities: nonspecific ST/T changes  Conduction Disutrbances:none  Narrative Interpretation:   Old EKG Reviewed: unchanged; no significant changes from previous EKG dated 10/05/2012.   Results for orders placed during the hospital encounter of 01/05/13  CBC      Result Value Range   WBC 4.9  4.0 - 10.5 K/uL   RBC 3.63 (*) 3.87 - 5.11 MIL/uL   Hemoglobin 11.1 (*) 12.0 - 15.0 g/dL   HCT 47.8 (*) 29.5 - 62.1 %   MCV 92.6  78.0 - 100.0 fL   MCH 30.6  26.0 - 34.0 pg   MCHC 33.0  30.0 - 36.0 g/dL   RDW 30.8 (*) 65.7 - 84.6 %   Platelets 158  150 - 400 K/uL  BASIC METABOLIC PANEL      Result Value Range   Sodium 140  135 - 145 mEq/L   Potassium 3.7  3.5 - 5.1 mEq/L   Chloride 113 (*) 96 - 112 mEq/L   CO2 13 (*) 19 - 32 mEq/L   Glucose, Bld 100 (*) 70 - 99 mg/dL   BUN 23  6 - 23 mg/dL   Creatinine, Ser 9.62 (*) 0.50 - 1.10 mg/dL   Calcium 9.5  8.4 - 95.2 mg/dL   GFR calc non Af Amer 28 (*) >90 mL/min   GFR calc Af Amer 32 (*) >90 mL/min  HEPATIC FUNCTION PANEL      Result Value Range   Total Protein 6.5  6.0 - 8.3 g/dL   Albumin 3.5  3.5 - 5.2 g/dL   AST 20  0 - 37 U/L   ALT 15  0 - 35 U/L   Alkaline Phosphatase 62  39 - 117 U/L   Total Bilirubin 0.5  0.3 - 1.2 mg/dL   Bilirubin, Direct <8.4  0.0 - 0.3 mg/dL   Indirect Bilirubin NOT CALCULATED  0.3 - 0.9 mg/dL  LIPASE, BLOOD      Result Value Range   Lipase 77 (*) 11 - 59 U/L  URINALYSIS W MICROSCOPIC + REFLEX CULTURE      Result Value Range   Color, Urine YELLOW  YELLOW   APPearance CLOUDY (*) CLEAR   Specific Gravity, Urine 1.018  1.005 - 1.030   pH 6.5  5.0 - 8.0   Glucose, UA NEGATIVE  NEGATIVE mg/dL   Hgb urine dipstick SMALL (*) NEGATIVE   Bilirubin Urine NEGATIVE  NEGATIVE   Ketones, ur NEGATIVE  NEGATIVE mg/dL   Protein, ur 132 (*) NEGATIVE mg/dL   Urobilinogen, UA 0.2  0.0 - 1.0 mg/dL    Nitrite NEGATIVE  NEGATIVE   Leukocytes, UA MODERATE (*) NEGATIVE   WBC, UA 11-20  <3 WBC/hpf   RBC / HPF 3-6  <3 RBC/hpf   Bacteria, UA MANY (*) RARE  GLUCOSE, CAPILLARY      Result Value Range   Glucose-Capillary 111 (*) 70 -  99 mg/dL   Comment 1 Notify RN     Comment 2 Documented in Chart    POCT I-STAT TROPONIN I      Result Value Range   Troponin i, poc 0.04  0.00 - 0.08 ng/mL   Comment 3            Dg Abd Acute W/chest 01/05/2013   *RADIOLOGY REPORT*  Clinical Data: Chest and abdominal pain for several days, new onset of weakness, initial encounter.  ACUTE ABDOMEN SERIES (ABDOMEN 2 VIEW & CHEST 1 VIEW)  Comparison: Abdominal radiograph - 10/05/2012; chest radiograph of 10/05/2012; abdominal CT - 09/19/2012  Findings:  Grossly unchanged cardiac silhouette and mediastinal contours.  The lungs remain hyperexpanded.  There is persistent mild elevation of the right hemidiaphragm.  Skin folds overlying the peripheral aspect of the left upper and mid lung.  No definite pneumothorax. No focal airspace opacity.  No pleural effusion or pneumothorax. No evidence of edema.  Paucity of bowel gas without evidence of obstruction.  No pneumoperitoneum, pneumatosis or portal venous gas.  Post cholecystectomy.  Multiple surgical clips overlie the lower abdomen and pelvis.  Osteopenia.  Mild scoliotic curvature of the thoracolumbar spine. Degenerative change of the right glenohumeral joint.  The right humeral head appears slightly high-riding suggestive of rotator cuff pathology.  IMPRESSION: 1.  Mild lung hyperexpansion without acute cardiopulmonary disease. 2.  Paucity of bowel gas without evidence of obstruction.   Original Report Authenticated By: Tacey Ruiz, MD   Results for Rhonda [name], Rhonda [name] (MRN [id number]) as of 01/05/2013 15:20  Ref. Range 08/30/2012 04:30 09/19/2012 18:08 10/05/2012 16:30 01/05/2013 12:27  BUN Latest Range: 6-23 mg/dL 18 16 18 23   Creatinine Latest Range: 0.50-1.10 mg/dL 9.14 (H) 7.82 (H)  9.56 (H) 1.68 (H)   Results for Rhonda [name], Rhonda [name] (MRN [id number]) as of 01/05/2013 15:20  Ref. Range 09/19/2012 18:00 09/19/2012 18:08 10/05/2012 16:30 01/05/2013 12:27  Hemoglobin Latest Range: 12.0-15.0 g/dL 9.9 (L) 46.9 (L) 62.9 (L) 11.1 (L)  HCT Latest Range: 36.0-46.0 % 31.1 (L) 30.0 (L) 31.7 (L) 33.6 (L)     1500:  Pt initially refused to stand for orthostatic VS due to feeling "weak." With encouragement, VS taking, +orthostatic. Pt has urostomy, UC pending. Very vague historian. Dx and testing d/w pt and family.  Questions answered.  Verb understanding, agreeable to admit.  T/C to [facility] Dr. Izola Price, case discussed, including:  HPI, pertinent PM/SHx, VS/PE, dx testing, ED course and treatment:  Agreeable to admit, requests to write temporary orders, obtain tele bed to team 4.   Laray Anger, DO 01/07/13 1253

## 2013-01-06 ENCOUNTER — Inpatient Hospital Stay (HOSPITAL_COMMUNITY): Payer: Medicare Other

## 2013-01-06 LAB — CBC
HCT: 30.6 % — ABNORMAL LOW (ref 36.0–46.0)
Hemoglobin: 9.8 g/dL — ABNORMAL LOW (ref 12.0–15.0)
MCH: 30.4 pg (ref 26.0–34.0)
MCHC: 32 g/dL (ref 30.0–36.0)
MCV: 95 fL (ref 78.0–100.0)
RBC: 3.22 MIL/uL — ABNORMAL LOW (ref 3.87–5.11)
RDW: 16.2 % — ABNORMAL HIGH (ref 11.5–15.5)

## 2013-01-06 LAB — IRON AND TIBC
Saturation Ratios: 27 % (ref 20–55)
TIBC: 264 ug/dL (ref 250–470)
UIBC: 194 ug/dL (ref 125–400)

## 2013-01-06 LAB — TROPONIN I: Troponin I: <0.3 ng/mL (ref <0.30)

## 2013-01-06 LAB — FERRITIN: Ferritin: 49 ng/mL (ref 10–291)

## 2013-01-06 LAB — BASIC METABOLIC PANEL
BUN: 25 mg/dL — ABNORMAL HIGH (ref 6–23)
CO2: 13 mEq/L — ABNORMAL LOW (ref 19–32)
Chloride: 118 mEq/L — ABNORMAL HIGH (ref 96–112)
Creatinine, Ser: 1.87 mg/dL — ABNORMAL HIGH (ref 0.50–1.10)
GFR calc Af Amer: 28 mL/min — ABNORMAL LOW (ref >90)
Glucose, Bld: 93 mg/dL (ref 70–99)
Potassium: 3.6 mEq/L (ref 3.5–5.1)

## 2013-01-06 LAB — RETICULOCYTES: Retic Ct Pct: 1.5 % (ref 0.4–3.1)

## 2013-01-06 LAB — VITAMIN B12: Vitamin B-12: 409 pg/mL (ref 211–911)

## 2013-01-06 MED ORDER — LORAZEPAM 2 MG/ML IJ SOLN
1.0000 mg | Freq: Once | INTRAMUSCULAR | Status: AC
  Administered 2013-01-06: 18:00:00 1 mg via INTRAVENOUS
  Filled 2013-01-06: qty 1

## 2013-01-06 MED ORDER — LORAZEPAM 2 MG/ML IJ SOLN: 1.0000 mg | INTRAMUSCULAR | Status: DC | PRN
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
## 2013-01-06 NOTE — Progress Notes (Signed)
PT Cancellation Note  Patient Details Name: Rhonda [name] MRN: [id number] DOB: 1930-04-29   DISCHARGE FROM PT     Reason Eval/Treat Not Completed: Patient declined,(pt stated "I don't do therapy" and declined for PT to return tomorrow.)   Rada Hay 01/06/2013, 1:16 PM Blanchard Kelch PT [phone]
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
## 2013-01-06 NOTE — Progress Notes (Signed)
Patient ID: Rhonda [name], female   DOB: [birth year], 79 y.o.   MRN: [id number]  [facility] PROGRESS NOTE  Rhonda [name] NWG:[id number] DOB: 1930/06/01 DOA: 01/05/2013 PCP: No primary provider on file.  Brief narrative: Pt is 79 yo female who presented to [facility] ED with main concern of persistent abdominal pain, mostly located in the upper quadrants, LUQ > RUQ, 5/10 in severity and with no specific alleviating or aggravating factors. Please note that pt provides somewhat conflicting information and intermittently used chest pain and abdominal pain and on specific questioning at one point denies both. Pt is overall poor historian. She describes chest discomfort, substernal, no specific alleviating or aggravating factors. She is not sure of the exact duration.   In ED, pt is clinically stable, on blood work, lipase elevated and found to be in acute renal failure. [facility] asked to admit for management of presumptive acute pancreatitis and chest pain work up.   Assessment and Plan:  Abdominal pain  - unclear etiology at this time and possibly releated to acute pancreatitis  - abdominal US pending as CT abdomen can not be done due to ARF - provide supportive care with IVF, analgesia and antiemetics as needed  - check lipase in AM  - pt wants to advance diet  Chest pain  - will monitor on telemetry  - cycled CE's and no acute elevation - provide analgesia and oxygen as needed  ARF - likely secondary to pre renal etiology  - IVF and will repeat BMP in AM  Anemia of chronic disease - drop inHg likely dilutional - repeat CBC in AM  Consultants:  None  Procedures/Studies: Dg Abd Acute W/chest  01/05/2013   Mild lung hyperexpansion without acute cardiopulmonary disease.  Paucity of bowel gas without evidence of obstruction.     Code Status: Full Family Communication: Pt at bedside Disposition Plan: Home when medically stable  HPI/Subjective: No events overnight.   Objective: Filed  Vitals:   01/05/13 1650 01/05/13 1655 01/05/13 2042 01/06/13 0457  BP: 92/57 114/57 119/51 98/47  Pulse: 87 64 56 58  Temp:   97.6 F (36.4 C) 98 F (36.7 C)  TempSrc:   Oral Oral  Resp: 16 20 18 12   Height:      Weight:    47.2 kg (104 lb 0.9 oz)  SpO2: 99% 100% 99% 98%    Intake/Output Summary (Last 24 hours) at 01/06/13 0747 Last data filed at 01/06/13 0510  Gross per 24 hour  Intake    455 ml  Output    450 ml  Net      5 ml    Exam:   General:  Pt is alert but confused but follows commands appropriately, not in acute distress  Cardiovascular: Regular rate and rhythm, S1/S2, no murmurs, no rubs, no gallops  Respiratory: Clear to auscultation bilaterally, no wheezing, no crackles, no rhonchi  Abdomen: Soft, non tender, non distended, bowel sounds present, no guarding  Extremities: No edema, pulses DP and PT palpable bilaterally  Neuro: Grossly nonfocal  Data Reviewed: Basic Metabolic Panel:  Recent Labs Lab 01/05/13 1227 01/05/13 1710 01/06/13 0524  NA 140  --  141  K 3.7  --  3.6  CL 113*  --  118*  CO2 13*  --  13*  GLUCOSE 100*  --  93  BUN 23  --  25*  CREATININE 1.68*  --  1.87*  CALCIUM 9.5  --  8.9  MG  --  2.3  --   PHOS  --  3.8  --    Liver Function Tests:  Recent Labs Lab 01/05/13 1227  AST 20  ALT 15  ALKPHOS 62  BILITOT 0.5  PROT 6.5  ALBUMIN 3.5    Recent Labs Lab 01/05/13 1227 01/06/13 0524  LIPASE 77* 103*   CBC:  Recent Labs Lab 01/05/13 1227 01/06/13 0524  WBC 4.9 4.3  HGB 11.1* 9.8*  HCT 33.6* 30.6*  MCV 92.6 95.0  PLT 158 123*   Cardiac Enzymes:  Recent Labs Lab 01/05/13 1710 01/05/13 2018 01/06/13 0524  TROPONINI <0.30 <0.30 <0.30   CBG:  Recent Labs Lab 01/05/13 1123  GLUCAP 111*   Scheduled Meds: . aspirin  325 mg Oral Daily  . enoxaparin (LOVENOX) injection  30 mg Subcutaneous Q24H  . influenza vac split quadrivalent PF  0.5 mL Intramuscular Tomorrow-1000  . sodium chloride  3 mL  Intravenous Q12H   Continuous Infusions:    Debbora Presto, MD  [facility] Pager [phone]  If 7PM-7AM, please contact night-coverage [email or web address] Password [email or web address] 01/06/2013, 7:47 AM   LOS: 1 day

## 2013-01-06 NOTE — Progress Notes (Signed)
Patient has been hallucinating. Patient has been heard multiple times yelling at people when there is no one in the room. Patient was also yelling about executing people.  When asked who is she talking to the patient responded, "Just shut my door and get out". Patient is very agitated and is stating that she is going to leave the hospital.  Patient called her son and asked him to come to the hospital. Son seems to be supporting the patients behavior and is also in aggreeance with her leaving the hospital. Son stated that "we have been through this before, and you just have to cater to her." MD notified. Will continue to monitor. Stanton Kidney R

## 2013-01-07 DIAGNOSIS — F22 Delusional disorders: Secondary | ICD-10-CM

## 2013-01-07 DIAGNOSIS — D696 Thrombocytopenia, unspecified: Secondary | ICD-10-CM | POA: Diagnosis present

## 2013-01-07 DIAGNOSIS — F29 Unspecified psychosis not due to a substance or known physiological condition: Secondary | ICD-10-CM

## 2013-01-07 DIAGNOSIS — R109 Unspecified abdominal pain: Secondary | ICD-10-CM | POA: Diagnosis present

## 2013-01-07 DIAGNOSIS — R443 Hallucinations, unspecified: Secondary | ICD-10-CM | POA: Diagnosis present

## 2013-01-07 LAB — CBC
MCH: 30.5 pg (ref 26.0–34.0)
MCHC: 32.1 g/dL (ref 30.0–36.0)
MCV: 95.1 fL (ref 78.0–100.0)
Platelets: 108 10*3/uL — ABNORMAL LOW (ref 150–400)
RBC: 3.08 MIL/uL — ABNORMAL LOW (ref 3.87–5.11)
RDW: 16.2 % — ABNORMAL HIGH (ref 11.5–15.5)
WBC: 3.9 10*3/uL — ABNORMAL LOW (ref 4.0–10.5)

## 2013-01-07 LAB — BASIC METABOLIC PANEL
CO2: 12 mEq/L — ABNORMAL LOW (ref 19–32)
Calcium: 8.9 mg/dL (ref 8.4–10.5)
Creatinine, Ser: 2.03 mg/dL — ABNORMAL HIGH (ref 0.50–1.10)
GFR calc Af Amer: 25 mL/min — ABNORMAL LOW (ref >90)
GFR calc non Af Amer: 22 mL/min — ABNORMAL LOW (ref >90)
Potassium: 3.7 mEq/L (ref 3.5–5.1)

## 2013-01-07 MED ORDER — LEVOFLOXACIN 500 MG PO TABS
500.0000 mg | ORAL_TABLET | Freq: Once | ORAL | Status: AC
  Administered 2013-01-07: 19:00:00 500 mg via ORAL
  Filled 2013-01-07: qty 1

## 2013-01-07 MED ORDER — LEVOFLOXACIN 250 MG PO TABS
250.0000 mg | ORAL_TABLET | ORAL | Status: DC
  Filled 2013-01-07: qty 1

## 2013-01-07 MED ORDER — BOOST / RESOURCE BREEZE PO LIQD
1.0000 | ORAL | Status: DC
  Administered 2013-01-07 – 2013-01-09 (×3): 1 via ORAL

## 2013-01-07 MED ORDER — DIPHENHYDRAMINE HCL 12.5 MG/5ML PO ELIX
12.5000 mg | ORAL_SOLUTION | Freq: Once | ORAL | Status: AC
  Administered 2013-01-08: 01:00:00 12.5 mg via ORAL
  Filled 2013-01-07: qty 5

## 2013-01-07 MED ORDER — LEVOFLOXACIN IN D5W 500 MG/100ML IV SOLN
500.0000 mg | Freq: Once | INTRAVENOUS | Status: DC
  Filled 2013-01-07: qty 100

## 2013-01-07 MED ORDER — HALOPERIDOL LACTATE 5 MG/ML IJ SOLN: 2.0000 mg | Freq: Four times a day (QID) | INTRAMUSCULAR | Status: DC | PRN

## 2013-01-07 MED ORDER — SODIUM CHLORIDE 0.9 % IV SOLN
INTRAVENOUS | Status: DC
  Administered 2013-01-07: 14:00:00 via INTRAVENOUS

## 2013-01-07 NOTE — Care Management Note (Addendum)
    Page 1 of 2   01/11/2013     4:24:58 PM   CARE MANAGEMENT NOTE 01/11/2013  Patient:  Rhonda [name], Rhonda [name]   Account Number:  [id number]  Date Initiated:  01/07/2013  Documentation initiated by:  [facility]  Subjective/Objective Assessment:   79 Y/O F ADMITTED W/ORTHOSTATIC HYPOTENSION.     Action/Plan:   FROM HOME W/SPOUSE.   Anticipated DC Date:  01/12/2013   Anticipated DC Plan:  HOME W HOME HEALTH SERVICES      DC Planning Services  CM consult      [facility] Choice  HOME HEALTH   Choice offered to / List presented to:  C-3 Spouse           Status of service:  In process, will continue to follow Medicare Important Message given?   (If response is "NO", the following Medicare IM given date fields will be blank) Date Medicare IM given:   Date Additional Medicare IM given:    Discharge Disposition:    Per UR Regulation:  Reviewed for med. necessity/level of care/duration of stay  If discussed at Long Length of Stay Meetings, dates discussed:   01/10/2013    Comments:  01/11/13 Natanael Saladin RN,BSN NCM [id number] [facility] CHOSEN FOR HH.HHRN,SW(ALREADY ORDERED).SPOKE TO PATIENT/SPOUSE/SON-BILLY ABOUT D/C PLANS ALL AGREE TO HOME W/HH.[facility] ALREADY FOLLOWING.PCP-DR. MICHAEL [facility] TEL#[phone],SEE D/C SECTION FOR F/U APPT.  01/10/13 Darielle Hancher RN,BSN NCM [phone] PER NSG-STILL TALKING TO SELF,SEEING THINGS.FOR PSYCH TO EVAL FOR DISPOSITION.UTI,BUN/DR ELEVATED.IVF @ 100,IV ABX.HOME W/ELDERLY SPOUSE.  01/09/13 Hines Kloss RN,BSN NCM [id number] PSYCH-NO CAPACITY FOR INFORMED CONSENT.SPOUSE-MR Cacho PRIMARY CAREGIVER, STATES THEY HAVE BEEN MARRIED OVER 64 YRS, ELDERLY MAN.SEE PSYCH SW NOTE.IF HOME THEN [facility] ALREADY FOLLOWING.RECOMMEND HHRN-SAFETY,HOME SW-RESOURCES.  01/07/13 Maijor Hornig RN,BSN NCM [phone] PROVIDED W/[facility]HC AGENCY LIST.[facility] CHOSEN KRISTEN REP AWARE & FOLLOWING.RECOMMEND HHRN-SAFETY,SW.AWAIT FINAL HH ORDERS.

## 2013-01-07 NOTE — Progress Notes (Signed)
ANTIBIOTIC CONSULT NOTE - INITIAL  Pharmacy Consult for Levaquin Indication: UTI  Allergies  Allergen Reactions  . Macrodantin [Nitrofurantoin Macrocrystal] Other (See Comments)    "Makes her fall"  . Septra [Sulfamethoxazole-Trimethoprim] Other (See Comments)    "Makes her fall"  . Soma [Carisoprodol] Other (See Comments)    "Makes her fall"    Patient Measurements: Height: 5\' 6"  (167.6 cm) Weight: 105 lb 1.6 oz (47.673 kg) IBW/kg (Calculated) : 59.3  Vital Signs: Temp: 98.5 F (36.9 C) (10/06 0634) Temp src: Oral (10/06 0634) BP: 114/53 mmHg (10/06 0642) Pulse Rate: 62 (10/06 0642) Intake/Output from previous day: 10/05 0701 - 10/06 0700 In: 120 [P.O.:120] Out: 950 [Urine:950] Intake/Output from this shift:    Labs:  Recent Labs  01/05/13 1227 01/06/13 0524 01/07/13 0440  WBC 4.9 4.3 3.9*  HGB 11.1* 9.8* 9.4*  PLT 158 123* 108*  CREATININE 1.68* 1.87* 2.03*   Estimated Creatinine Clearance: 16.6 ml/min (by C-G formula based on Cr of 2.03). No results found for this basename: Rolm Gala, VANCORANDOM, GENTTROUGH, GENTPEAK, GENTRANDOM, TOBRATROUGH, TOBRAPEAK, TOBRARND, AMIKACINPEAK, AMIKACINTROU, AMIKACIN,  in the last 72 hours   Microbiology: Recent Results (from the past 720 hour(s))  URINE CULTURE     Status: None   Collection Time    01/05/13  2:01 PM      Result Value Range Status   Specimen Description URINE, RANDOM   Final   Special Requests NONE   Final   Culture  Setup Time     Final   Value: 01/05/2013 19:50     Performed at [facility] Count     Final   Value: >=100,000 COLONIES/ML     Performed at [facility]   Culture     Final   Value: GRAM NEGATIVE RODS     Performed at [facility]   Report Status PENDING   Incomplete    Medical History: Past Medical History  Diagnosis Date  . Hypertension   . Anemia   . Arthritis   . Pneumonia   . Goiter   . Schizo affective schizophrenia   .  Myocardial infarct   . Chronic back pain   . Ventral hernia     Medications:  Scheduled:  . aspirin  325 mg Oral Daily  . feeding supplement  1 Container Oral Q24H  . sodium chloride  3 mL Intravenous Q12H   Infusions:  . sodium chloride     Assessment: 79 yo female who presented to [facility] ED with persistent abdominal pain, mostly located in the upper quadrants. Beginning levaquin per Pharmacy protocol for empiric coverage of GNR UTI.  Urine culture from 10/4 growing >100k GNR. Previous culture from 09/19/12 grew Klebsiella pneumoniae resistant to ampicillin only  ARF, likely pre-renal, SCr up to 2.03 today, current CrCl ~16 ml/min  WBC low  Afebrile  Goal of Therapy:  Eradication of infection Dose per renal function   Plan:   Levaquin 500mg  IV x 1, then 250mg  PO q48h Follow up renal function & cultures Monitor QTc with concurrent haldol (0.49 this morning)  [name], PharmD, BCPS Pager: [phone] 01/07/2013,1:32 PM

## 2013-01-07 NOTE — Progress Notes (Signed)
INITIAL NUTRITION ASSESSMENT  DOCUMENTATION CODES Per approved criteria  -Severe malnutrition in the context of chronic illness -Underweight   INTERVENTION: Add Resource Breeze po daily, each supplement provides 250 kcal and 9 grams of protein. Agree with Regular, liberalized diet. RD to continue to follow nutrition care plan.  NUTRITION DIAGNOSIS: Inadequate oral intake related to GI distress as evidenced by variable meal completion.   Goal: Intake to meet >90% of estimated nutrition needs.  Monitor:  weight trends, lab trends, I/O's, PO intake, supplement tolerance  Reason for Assessment: Malnutrition Screening Tool  79 y.o. female  Admitting Dx: abdominal pain  ASSESSMENT: PMHx significant for PNA, HTN, schizophrenia, MI. Admitted with persistent abdominal and chest pain. Work-up ongoing.  Pt with hallucinations last night. Pt with 9% wt loss x 5 months. Pt confirms ongoing weight loss. She states however, that her oral intake is fine at home. Pt would not make eye contact with this RD when speaking. She states that she does not like Ensure. Husband at bedside not contributing any information.  Currently ordered for a Regular diet. Pt states that she is eating well.  Nutrition Focused Physical Exam:  Subcutaneous Fat:  Orbital Region: WNL Upper Arm Region: severe depletion Thoracic and Lumbar Region: n/a  Muscle:  Temple Region: moderate depletion Clavicle Bone Region: moderate depletion Clavicle and Acromion Bone Region: n/a Scapular Bone Region: n/a Dorsal Hand: moderate depletion Patellar Region: moderate depletion Anterior Thigh Region: n/a Posterior Calf Region: n/a  Edema: na  Pt meets criteria for severe MALNUTRITION in the context of chronic illness as evidenced by 9% wt loss x 5 months and severe fat mass loss. Most recent potassium, magnesium and phosphorus WNL.   Height: Ht Readings from Last 1 Encounters:  01/05/13 5\' 6"  (1.676 m)     Weight: Wt Readings from Last 1 Encounters:  01/07/13 105 lb 1.6 oz (47.673 kg)    Ideal Body Weight: 130 lb  % Ideal Body Weight: 81%  Wt Readings from Last 10 Encounters:  01/07/13 105 lb 1.6 oz (47.673 kg)  10/05/12 106 lb (48.081 kg)  08/30/12 115 lb 9.6 oz (52.436 kg)  11/01/11 124 lb 8 oz (56.473 kg)    Usual Body Weight: 115 lb  % Usual Body Weight: 91%  BMI:  Body mass index is 16.97 kg/(m^2). Underweight  Estimated Nutritional Needs: Kcal: 1400 - 1600 Protein: 56 - 65 g Fluid: 1.4 - 1.6 liters  Skin: intact  Diet Order: General  EDUCATION NEEDS: -No education needs identified at this time   Intake/Output Summary (Last 24 hours) at 01/07/13 1023 Last data filed at 01/07/13 0600  Gross per 24 hour  Intake    120 ml  Output    950 ml  Net   -830 ml    Last BM: 10/5  Labs:   Recent Labs Lab 01/05/13 1227 01/05/13 1710 01/06/13 0524 01/07/13 0440  NA 140  --  141 138  K 3.7  --  3.6 3.7  CL 113*  --  118* 117*  CO2 13*  --  13* 12*  BUN 23  --  25* 30*  CREATININE 1.68*  --  1.87* 2.03*  CALCIUM 9.5  --  8.9 8.9  MG  --  2.3  --   --   PHOS  --  3.8  --   --   GLUCOSE 100*  --  93 93    CBG (last 3)   Recent Labs  01/05/13 1123  GLUCAP 111*  Scheduled Meds: . aspirin  325 mg Oral Daily  . enoxaparin (LOVENOX) injection  30 mg Subcutaneous Q24H  . sodium chloride  3 mL Intravenous Q12H    Continuous Infusions:   Past Medical History  Diagnosis Date  . Hypertension   . Anemia   . Arthritis   . Pneumonia   . Goiter   . Schizo affective schizophrenia   . Myocardial infarct   . Chronic back pain   . Ventral hernia     Past Surgical History  Procedure Laterality Date  . Abdominal hysterectomy    . Back surgery    . Hernia repair    . Cholecystectomy    . Revision urostomy cutaneous      Jarold Motto MS, RD, LDN Pager: [phone] After-hours pager: [phone]

## 2013-01-07 NOTE — Progress Notes (Signed)
[facility] PROGRESS NOTE  Rhonda [name] AVW:[id number] DOB: Jan 28, 1931 DOA: 01/05/2013 PCP: No primary provider on file.  Brief narrative: Pt is 79 yo female who presented to [facility] ED with main concern of persistent abdominal pain, mostly located in the upper quadrants, LUQ > RUQ, 5/10 in severity and with no specific alleviating or aggravating factors. Patient provided somewhat conflicting information and intermittently used chest pain and abdominal pain and on specific questioning at one point denied both. Pt is overall poor historian.  In ED, pt as clinically stable, on blood work, lipase was elevated at 77. CBC revealed normal white blood cell count, hemoglobin 11.1. BMP revealed creatinine of 1.68. Abdominal x-ray revealed no acute intra-abdominal findings, no evidence of obstruction. Additionally, abdominal ultrasound showed no sonographic explanation for abdominal pain, no urinary obstruction, postcholecystectomy and findings presumably due to sequela of postcholecystectomy state. Hospital course is somewhat complicated to do to mental status changes, hallucinations and agitation. Psychiatry was consulted for input on management and capacity evaluation.  Assessment and Plan:   Principal problem: Abdominal pain  - Possibly related to acute pancreatitis as lipase level continues to be elevated 77 and 103 - Possibility also includes urinary tract infection as urine culture is growing gram-negative rods. We started patient on Levaquin based on sensitivity report on previous urine cultures which were growing Klebsiella - abdominal US with findings as above, postcholecystectomy with mild dilatation of common bile duct presumably the sequela of postcholecystectomy state  - patient wanted to have diet advanced. - Pain management with Dilaudid 1 mg IV every 2 hours as needed for severe pain and Norco every 4 hours by mouth when necessary moderate pain  Active problems: Urinary tract infection -  Follow up urine culture results, preliminary report growing gram neg rods - Started Levaquin Chest pain  - resolved - cardiac enzyme x 1 set negative Agitation and hallucination - Appreciate psychiatry evaluation - Discontinue Ativan and start Haldol 2 mg IV when necessary every 6 hours Acute renal failure  - likely secondary to pre renal etiology  - start IV fluids, normal saline at 100 cc an hour - Followup renal function in the morning Anemia of chronic disease  - drop inHg likely dilutional  - Hemoglobin stable at 9.4, no indications for transfusion  Thrombocytopenia  - Discontinue Lovenox and use SCDs for DVT prophylaxis   Code Status: Full  Family Communication: Pt at bedside  Disposition Plan: Home when medically stable  Consultants:  Psychiatry 01/07/2013 for indications, agitation and capacity Procedures/Studies:  Dg Abd Acute W/chest 01/05/2013  Mild lung hyperexpansion without acute cardiopulmonary disease. Paucity of bowel gas without evidence of obstruction.  Antibiotics:  None   HPI/Subjective: No events overnight.   Objective: Filed Vitals:   01/06/13 1410 01/06/13 2145 01/07/13 0634 01/07/13 0642  BP: 98/42 102/50 97/35 114/53  Pulse: 61 64 65 62  Temp: 97.6 F (36.4 C) 98.2 F (36.8 C) 98.5 F (36.9 C)   TempSrc: Oral Oral Oral   Resp: 16 18    Height:      Weight:   47.673 kg (105 lb 1.6 oz)   SpO2: 99% 98% 97%     Intake/Output Summary (Last 24 hours) at 01/07/13 1317 Last data filed at 01/07/13 0600  Gross per 24 hour  Intake      0 ml  Output    850 ml  Net   -850 ml    Exam:   General:  Pt is  not in acute  distress  Cardiovascular: Regular rate and rhythm, S1/S2 appreciated  Respiratory: Clear to auscultation bilaterally, no wheezing, no crackles, no rhonchi  Abdomen: Soft, non tender, non distended, bowel sounds present, no guarding  Extremities: No edema, pulses DP and PT palpable bilaterally  Neuro: Grossly nonfocal  Data  Reviewed: Basic Metabolic Panel:  Recent Labs Lab 01/05/13 1227 01/05/13 1710 01/06/13 0524 01/07/13 0440  NA 140  --  141 138  K 3.7  --  3.6 3.7  CL 113*  --  118* 117*  CO2 13*  --  13* 12*  GLUCOSE 100*  --  93 93  BUN 23  --  25* 30*  CREATININE 1.68*  --  1.87* 2.03*  CALCIUM 9.5  --  8.9 8.9  MG  --  2.3  --   --   PHOS  --  3.8  --   --    Liver Function Tests:  Recent Labs Lab 01/05/13 1227  AST 20  ALT 15  ALKPHOS 62  BILITOT 0.5  PROT 6.5  ALBUMIN 3.5    Recent Labs Lab 01/05/13 1227 01/06/13 0524  LIPASE 77* 103*   No results found for this basename: AMMONIA,  in the last 168 hours CBC:  Recent Labs Lab 01/05/13 1227 01/06/13 0524 01/07/13 0440  WBC 4.9 4.3 3.9*  HGB 11.1* 9.8* 9.4*  HCT 33.6* 30.6* 29.3*  MCV 92.6 95.0 95.1  PLT 158 123* 108*   Cardiac Enzymes:  Recent Labs Lab 01/05/13 1710 01/05/13 2018 01/06/13 0524  TROPONINI <0.30 <0.30 <0.30   BNP: No components found with this basename: POCBNP,  CBG:  Recent Labs Lab 01/05/13 1123  GLUCAP 111*    URINE CULTURE     Status: None   Collection Time    01/05/13  2:01 PM      Result Value Range Status   Specimen Description URINE, RANDOM   Final   Special Requests NONE   Final   Culture  Setup Time     Final   Value: 01/05/2013 19:50     Performed at [facility] Count     Final   Value: >=100,000 COLONIES/ML     Performed at [facility]   Culture     Final   Value: GRAM NEGATIVE RODS     Performed at [facility]   Report Status PENDING   Incomplete     Scheduled Meds: . aspirin  325 mg Oral Daily  . enoxaparin (LOVENOX) injection  30 mg Subcutaneous Q24H  . feeding supplement  1 Container Oral Q24H  . sodium chloride  3 mL Intravenous Q12H    Debbora Presto, MD  [facility] Pager [phone]  If 7PM-7AM, please contact night-coverage [email or web address] Password [email or web address] 01/07/2013, 1:17 PM   LOS: 2 days
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
## 2013-01-08 DIAGNOSIS — R109 Unspecified abdominal pain: Secondary | ICD-10-CM

## 2013-01-08 LAB — CBC
MCH: 30.3 pg (ref 26.0–34.0)
MCHC: 32.2 g/dL (ref 30.0–36.0)
MCV: 94.1 fL (ref 78.0–100.0)
Platelets: 104 10*3/uL — ABNORMAL LOW (ref 150–400)
RBC: 3.07 MIL/uL — ABNORMAL LOW (ref 3.87–5.11)
RDW: 16.2 % — ABNORMAL HIGH (ref 11.5–15.5)
WBC: 3.8 10*3/uL — ABNORMAL LOW (ref 4.0–10.5)

## 2013-01-08 LAB — URINE CULTURE: Colony Count: >=100000

## 2013-01-08 LAB — BASIC METABOLIC PANEL
CO2: 11 mEq/L — ABNORMAL LOW (ref 19–32)
Calcium: 8.7 mg/dL (ref 8.4–10.5)
Creatinine, Ser: 2.02 mg/dL — ABNORMAL HIGH (ref 0.50–1.10)
GFR calc Af Amer: 26 mL/min — ABNORMAL LOW (ref >90)
GFR calc non Af Amer: 22 mL/min — ABNORMAL LOW (ref >90)
Potassium: 4 mEq/L (ref 3.5–5.1)
Sodium: 137 mEq/L (ref 135–145)

## 2013-01-08 LAB — LIPASE, BLOOD: Lipase: 55 U/L (ref 11–59)

## 2013-01-08 MED ORDER — HYDROMORPHONE HCL PF 1 MG/ML IJ SOLN
1.0000 mg | INTRAMUSCULAR | Status: DC | PRN
  Administered 2013-01-08 – 2013-01-09 (×5): 1 mg via INTRAMUSCULAR
  Filled 2013-01-08 (×5): qty 1

## 2013-01-08 MED ORDER — LORAZEPAM 1 MG PO TABS
1.0000 mg | ORAL_TABLET | Freq: Four times a day (QID) | ORAL | Status: DC | PRN
  Administered 2013-01-08 – 2013-01-10 (×5): 1 mg via ORAL
  Filled 2013-01-08 (×7): qty 1

## 2013-01-08 NOTE — Progress Notes (Addendum)
Patient ID: Rhonda [name], female   DOB: Oct 18, 1930, 79 y.o.   MRN: [id number] [facility] PROGRESS NOTE  DENETTE HASS WUJ:[id number] DOB: 1930-07-28 DOA: 01/05/2013 PCP: No primary provider on file.  Brief narrative:  Pt is 79 yo female who presented to [facility] ED with main concern of persistent abdominal pain, mostly located in the upper quadrants, LUQ > RUQ, 5/10 in severity and with no specific alleviating or aggravating factors. Patient provided somewhat conflicting information and intermittently used chest pain and abdominal pain and on specific questioning at one point denied both. Pt is overall poor historian.   In ED, pt as clinically stable, on blood work, lipase was elevated at 77. CBC revealed normal white blood cell count, hemoglobin 11.1. BMP revealed creatinine of 1.68. Abdominal x-ray revealed no acute intra-abdominal findings, no evidence of obstruction. Additionally, abdominal ultrasound showed no sonographic explanation for abdominal pain, no urinary obstruction, postcholecystectomy and findings presumably due to sequela of postcholecystectomy state. Hospital course is somewhat complicated to do to mental status changes, hallucinations and agitation. Psychiatry was consulted for input on management and capacity evaluation.   Assessment and Plan:  Principal problem:  Abdominal pain  - Possibly related to acute pancreatitis, lipase is now trending down and is within normal limits  - another possibility includes UTI, pt denies any specific urinary symptoms but she is poor historian, urine Cx + E. Coli - pt was started on Levaquin but she is resistant to FQ's, Bactrim is one option but she is allergic to this  - since not really clear if she has an active UTI or rather chronic colonization will hold off on ABX as we are not able to establish IV access line  - abdominal US with findings as above, postcholecystectomy with mild dilatation of common bile duct presumably the sequela of  postcholecystectomy state  - patient wanted to have diet advanced.  - Pain management with Dilaudid 1 mg IV every 2 hours as needed for severe pain and Norco every 4 hours by mouth when necessary moderate pain  Active problems:  Urinary tract infection  - + for E. Coli but pt has not reported any symptoms  - d/c levaquin as urine culture shows resistance to FQ - we are not able to establish access line, pt very agitated and refusing  Chest pain  - resolved  - cardiac enzyme x 1 set negative  Agitation and hallucination  - Appreciate psychiatry evaluation  - continue Ativan and Haldol - will need placement as husband can not take care of her at home  Acute renal failure  - likely secondary to pre renal etiology  - unable to provide IVF as pt very agitated and refusing  - Followup renal function in the morning  Anemia of chronic disease  - drop in Hg but overall stable  - no indications for transfusion  Thrombocytopenia  - Discontinue Lovenox and use SCDs for DVT prophylaxis   moderate malnutrition - Secondary to poor oral intake, progressive deconditioning and failure to thrive - Nutrition consultation  Code Status: Full  Family Communication: Pt at bedside  Disposition Plan: SNF  Consultants:  Psychiatry 01/07/2013 for indications, agitation and capacity Procedures/Studies:  Dg Abd Acute W/chest 01/05/2013  Mild lung hyperexpansion without acute cardiopulmonary disease. Paucity of bowel gas without evidence of obstruction.   HPI/Subjective: No events overnight.   Objective: Filed Vitals:   01/07/13 1434 01/07/13 2058 01/08/13 0502 01/08/13 1410  BP: 105/70 103/53 109/52 119/67  Pulse: 65  62 64 70  Temp: 99.5 F (37.5 C) 97.7 F (36.5 C) 98.3 F (36.8 C) 98.3 F (36.8 C)  TempSrc: Oral Oral Oral Oral  Resp:  15 11 16   Height:      Weight:   46.9 kg (103 lb 6.3 oz)   SpO2: 99% 100% 99% 99%    Intake/Output Summary (Last 24 hours) at 01/08/13 1800 Last data filed  at 01/08/13 1741  Gross per 24 hour  Intake    600 ml  Output   1875 ml  Net  -1275 ml    Exam:   General:  Pt is alert, follows commands appropriately, not in acute distress  Cardiovascular: Regular rate and rhythm, S1/S2, no murmurs, no rubs, no gallops  Respiratory: Clear to auscultation bilaterally, no wheezing, no crackles, no rhonchi  Abdomen: Soft, non tender, non distended, bowel sounds present, no guarding  Extremities: No edema, pulses DP and PT palpable bilaterally  Neuro: Grossly nonfocal  Data Reviewed: Basic Metabolic Panel:  Recent Labs Lab 01/05/13 1227 01/05/13 1710 01/06/13 0524 01/07/13 0440 01/08/13 0425  NA 140  --  141 138 137  K 3.7  --  3.6 3.7 4.0  CL 113*  --  118* 117* 115*  CO2 13*  --  13* 12* 11*  GLUCOSE 100*  --  93 93 93  BUN 23  --  25* 30* 35*  CREATININE 1.68*  --  1.87* 2.03* 2.02*  CALCIUM 9.5  --  8.9 8.9 8.7  MG  --  2.3  --   --   --   PHOS  --  3.8  --   --   --    Liver Function Tests:  Recent Labs Lab 01/05/13 1227  AST 20  ALT 15  ALKPHOS 62  BILITOT 0.5  PROT 6.5  ALBUMIN 3.5    Recent Labs Lab 01/05/13 1227 01/06/13 0524 01/08/13 0425  LIPASE 77* 103* 55   CBC:  Recent Labs Lab 01/05/13 1227 01/06/13 0524 01/07/13 0440 01/08/13 0425  WBC 4.9 4.3 3.9* 3.8*  HGB 11.1* 9.8* 9.4* 9.3*  HCT 33.6* 30.6* 29.3* 28.9*  MCV 92.6 95.0 95.1 94.1  PLT 158 123* 108* 104*   Cardiac Enzymes:  Recent Labs Lab 01/05/13 1710 01/05/13 2018 01/06/13 0524  TROPONINI <0.30 <0.30 <0.30   CBG:  Recent Labs Lab 01/05/13 1123  GLUCAP 111*    Recent Results (from the past 240 hour(s))  URINE CULTURE     Status: None   Collection Time    01/05/13  2:01 PM      Result Value Range Status   Specimen Description URINE, RANDOM   Final   Special Requests NONE   Final   Culture  Setup Time     Final   Value: 01/05/2013 19:50     Performed at [facility] Count     Final   Value:  >=100,000 COLONIES/ML     Performed at [facility]   Culture     Final   Value: ESCHERICHIA COLI     Performed at [facility]   Report Status 01/08/2013 FINAL   Final   Organism ID, Bacteria ESCHERICHIA COLI   Final     Scheduled Meds: . aspirin  325 mg Oral Daily  . feeding supplement (RESOURCE BREEZE)  1 Container Oral Q24H  . levofloxacin  250 mg Oral Q48H  . sodium chloride  3 mL Intravenous Q12H   Continuous  Infusions: . sodium chloride 100 mL/hr at 01/07/13 1346     Debbora Presto, MD  [facility] Pager [phone]  If 7PM-7AM, please contact night-coverage [email or web address] Password [email or web address] 01/08/2013, 6:00 PM   LOS: 3 days
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
## 2013-01-08 NOTE — Progress Notes (Signed)
Unable to get a working IV site, after several attempts by the IV nurse. IV nurse recommended a picc line for this patient if she needs fluids and IV meds. Received order for IM dilaudid this am.  Will cont to monitor.

## 2013-01-08 NOTE — Clinical Documentation Improvement (Signed)
THIS DOCUMENT IS NOT A PERMANENT PART OF THE MEDICAL RECORD  Please update your documentation with the medical record to reflect your response to this query. If you need help knowing how to do this please call [phone].  01/08/13   To Dr. Charline Bills. [name],  In a better effort to capture your patient's severity of illness, reflect appropriate length of stay and utilization of resources, a review of the patient medical record has revealed the following indicators.    Based on your clinical judgment, please clarify and document in a progress note and/or discharge summary the clinical condition associated with the following supporting information:  In responding to this query please exercise your independent judgment.  The fact that a query is asked, does not imply that any particular answer is desired or expected.  Per nutrition consult patient meeting criteria for "severe malnutrition" and " underweight" status due to "9% wt loss x 5 months" if either evaluation is appropriate please document in notes.  Thank you   Possible Clinical Conditions?  Moderate Malnutrition  Severe Malnutrition    Protein Calorie Malnutrition  Severe Protein Calorie Malnutrition  Emaciation   Cachexia    Underweight  Other Condition  Cannot clinically determine     Supporting Information: Schizophrenia, Acute pancreatitis, Unspecified psychosis   Ht 5'6" (1.676 m)  Wt 105 lbs 1.6 oz (47.673)  BMI:  16.97 kg  Weight  Loss 9% over 5 months    Nutrition Consult: -Severe malnutrition in the context of chronic illness  -Underweight     You may use possible, probable, or suspect with inpatient documentation. possible, probable, suspected diagnoses MUST be documented at the time of discharge  Reviewed: Agree with moderate malnutrition, please see progress note  Thank Claud Kelp Addison  Clinical Documentation Specialist: [phone] Health Information  Management [place]

## 2013-01-08 NOTE — Progress Notes (Signed)
Pt hysterically crying. RN unable to comfort pt. Pt stated " I have not slept in 200 days" reports "executing police" and reports "being a special person with special powers". Pt in loud yelling conversation with imaginary person. Pt. Told husband to leave room. Husband stepped into hall and notified RN "she is having one of her spells" and that this happens often at home. MD notified. Will continue to monitor closely.  Genevive Bi, RN

## 2013-01-08 NOTE — Progress Notes (Signed)
Clinical Social Work  CSW staffed case with Chiropodist ([facility] [place]) and explained patient's situation. CSW explained that patient does not have capacity and that she did not want to discuss DC plans. AD suggested that if patient returns home then to contact APS for evaluation to be made at the home. If patient DC to SNF, then CSW will update SNF SW of possible needs.  CSW will continue to follow.  [place], [place] [phone]

## 2013-01-09 DIAGNOSIS — R443 Hallucinations, unspecified: Secondary | ICD-10-CM

## 2013-01-09 DIAGNOSIS — N390 Urinary tract infection, site not specified: Principal | ICD-10-CM

## 2013-01-09 LAB — CBC
Hemoglobin: 9.5 g/dL — ABNORMAL LOW (ref 12.0–15.0)
MCHC: 32.1 g/dL (ref 30.0–36.0)
RDW: 16.1 % — ABNORMAL HIGH (ref 11.5–15.5)
WBC: 3.9 10*3/uL — ABNORMAL LOW (ref 4.0–10.5)

## 2013-01-09 LAB — BASIC METABOLIC PANEL
BUN: 34 mg/dL — ABNORMAL HIGH (ref 6–23)
Calcium: 9 mg/dL (ref 8.4–10.5)
GFR calc Af Amer: 22 mL/min — ABNORMAL LOW (ref >90)
GFR calc non Af Amer: 19 mL/min — ABNORMAL LOW (ref >90)
Potassium: 4.1 mEq/L (ref 3.5–5.1)
Sodium: 139 mEq/L (ref 135–145)

## 2013-01-09 LAB — CREATININE, URINE, RANDOM: Creatinine, Urine: 51.9 mg/dL

## 2013-01-09 MED ORDER — DOCUSATE SODIUM 100 MG PO CAPS
100.0000 mg | ORAL_CAPSULE | Freq: Two times a day (BID) | ORAL | Status: DC
  Administered 2013-01-09 – 2013-01-12 (×6): 100 mg via ORAL
  Filled 2013-01-09 (×8): qty 1

## 2013-01-09 MED ORDER — HYDROMORPHONE HCL PF 1 MG/ML IJ SOLN
0.5000 mg | INTRAMUSCULAR | Status: DC | PRN
  Administered 2013-01-09 – 2013-01-12 (×4): 0.5 mg via INTRAVENOUS
  Filled 2013-01-09 (×4): qty 1

## 2013-01-09 MED ORDER — CEFTRIAXONE SODIUM 1 G IJ SOLR
1.0000 g | INTRAMUSCULAR | Status: DC
  Filled 2013-01-09: qty 10

## 2013-01-09 MED ORDER — HALOPERIDOL LACTATE 5 MG/ML IJ SOLN
2.0000 mg | Freq: Four times a day (QID) | INTRAMUSCULAR | Status: DC | PRN
  Administered 2013-01-09 – 2013-01-11 (×2): 2 mg via INTRAMUSCULAR
  Filled 2013-01-09 (×2): qty 1

## 2013-01-09 MED ORDER — ACETAMINOPHEN 325 MG PO TABS
650.0000 mg | ORAL_TABLET | ORAL | Status: DC | PRN
  Administered 2013-01-11: 650 mg via ORAL
  Filled 2013-01-09: qty 2

## 2013-01-09 MED ORDER — SODIUM CHLORIDE 0.9 % IV SOLN
INTRAVENOUS | Status: DC
  Administered 2013-01-09 – 2013-01-10 (×3): via INTRAVENOUS

## 2013-01-09 MED ORDER — MORPHINE SULFATE ER 15 MG PO TBCR
15.0000 mg | EXTENDED_RELEASE_TABLET | Freq: Three times a day (TID) | ORAL | Status: DC
  Administered 2013-01-09 – 2013-01-12 (×9): 15 mg via ORAL
  Filled 2013-01-09 (×10): qty 1

## 2013-01-09 MED ORDER — DEXTROSE 5 % IV SOLN
1.0000 g | Freq: Every day | INTRAVENOUS | Status: DC
  Administered 2013-01-09 – 2013-01-10 (×2): 1 g via INTRAVENOUS
  Filled 2013-01-09 (×2): qty 10

## 2013-01-09 MED ORDER — ZOLPIDEM TARTRATE 5 MG PO TABS
5.0000 mg | ORAL_TABLET | Freq: Every evening | ORAL | Status: DC | PRN
  Administered 2013-01-09 – 2013-01-10 (×2): 5 mg via ORAL
  Filled 2013-01-09 (×2): qty 1

## 2013-01-09 MED ORDER — ENSURE COMPLETE PO LIQD
237.0000 mL | ORAL | Status: DC
  Administered 2013-01-09 – 2013-01-10 (×2): 237 mL via ORAL

## 2013-01-09 MED ORDER — HYDROMORPHONE HCL PF 1 MG/ML IJ SOLN
1.0000 mg | INTRAMUSCULAR | Status: DC | PRN
  Administered 2013-01-09 (×2): 1 mg via INTRAVENOUS
  Filled 2013-01-09 (×2): qty 1

## 2013-01-09 MED ORDER — SODIUM BICARBONATE 650 MG PO TABS
650.0000 mg | ORAL_TABLET | Freq: Two times a day (BID) | ORAL | Status: DC
  Administered 2013-01-09 – 2013-01-12 (×7): 650 mg via ORAL
  Filled 2013-01-09 (×8): qty 1

## 2013-01-09 NOTE — Progress Notes (Signed)
PT Cancellation Note  Patient Details Name: Rhonda [name] MRN: [id number] DOB: Feb 15, 1931   Cancelled Treatment:     Pt's son declined PT. Asked if OK for PT to check back tomorrow-- son stated "no". RN aware and requested PT to try again tomorrow.   Rada Hay 01/09/2013, 3:07 PM Blanchard Kelch PT [phone]

## 2013-01-09 NOTE — Progress Notes (Signed)
[facility] PROGRESS NOTE  Rhonda [name] ZOX:[id number] DOB: 10-09-1930 DOA: 01/05/2013 PCP: No primary provider on file.  Assessment/Plan: #1 abdominal pain Questionable etiology. Question secondary to pancreatitis versus UTI. Clinical improvement. Acute abdominal series is negative. Patient hasn't done IV Rocephin for Escherichia coli UTI. Abdominal ultrasound negative. Supportive and symptomatic care.  #2 Escherichia coli UTI Will start IV Rocephin.  #3 auditory hallucinations Per nursing patient with auditory hallucinations and some agitation. Patient was seen by psychiatry and deemed incompetent. Patient was placed on Haldol as needed for agitation. Due to patient's continued auditory hallucinations will have patient reassessed by psychiatry for further recommendations.  #4 acute on chronic kidney disease Patient's baseline creatinine is 1.5 to about 2. Creatinine slowly creeping up. Urinalysis consistent with a UTI. Will check urine lites. Place on IV fluids. Follow.  #5 acidosis Likely secondary to chronic kidney disease. We'll place him bicarbonate tablet.  #6 anemia of chronic disease H&H stable.  #7 moderate nutrition Nutrition follow.  #8 thrombocytopenia Stable. Continue SCDs. Follow.  #9 prophylaxis SCDs for DVT prophylaxis.  Code Status: Full Family Communication: Updated patient no family at bedside. Disposition Plan: Home versus SNF versus in patient psych when medically stable   Consultants:  Psychiatry: Dr. Florentina Jenny 01/07/2013  Procedures:  Acute abdominal series 01/05/2013  Antibiotics:  IV Rocephin 01/09/2013  HPI/Subjective: Patient states abdominal pain improved. Patient c/o chronic back pain. Per nursing patient with auditory hallucinations.  Objective: Filed Vitals:   01/09/13 1333  BP: 100/40  Pulse: 70  Temp: 98.5 F (36.9 C)  Resp: 14    Intake/Output Summary (Last 24 hours) at 01/09/13 1808 Last data filed at 01/09/13  1500  Gross per 24 hour  Intake 871.67 ml  Output   1600 ml  Net -728.33 ml   Filed Weights   01/07/13 0634 01/08/13 0502 01/09/13 0526  Weight: 47.673 kg (105 lb 1.6 oz) 46.9 kg (103 lb 6.3 oz) 48.1 kg (106 lb 0.7 oz)    Exam:   General:  nad  Cardiovascular: rrr  Respiratory: ctab  Abdomen: soft/nt/nd/+bs  Musculoskeletal: no c/c/e  Data Reviewed: Basic Metabolic Panel:  Recent Labs Lab 01/05/13 1227 01/05/13 1710 01/06/13 0524 01/07/13 0440 01/08/13 0425 01/09/13 0450  NA 140  --  141 138 137 139  K 3.7  --  3.6 3.7 4.0 4.1  CL 113*  --  118* 117* 115* 115*  CO2 13*  --  13* 12* 11* 14*  GLUCOSE 100*  --  93 93 93 95  BUN 23  --  25* 30* 35* 34*  CREATININE 1.68*  --  1.87* 2.03* 2.02* 2.29*  CALCIUM 9.5  --  8.9 8.9 8.7 9.0  MG  --  2.3  --   --   --   --   PHOS  --  3.8  --   --   --   --    Liver Function Tests:  Recent Labs Lab 01/05/13 1227  AST 20  ALT 15  ALKPHOS 62  BILITOT 0.5  PROT 6.5  ALBUMIN 3.5    Recent Labs Lab 01/05/13 1227 01/06/13 0524 01/08/13 0425  LIPASE 77* 103* 55   No results found for this basename: AMMONIA,  in the last 168 hours CBC:  Recent Labs Lab 01/05/13 1227 01/06/13 0524 01/07/13 0440 01/08/13 0425 01/09/13 0450  WBC 4.9 4.3 3.9* 3.8* 3.9*  HGB 11.1* 9.8* 9.4* 9.3* 9.5*  HCT 33.6* 30.6* 29.3* 28.9* 29.6*  MCV 92.6 95.0  95.1 94.1 94.9  PLT 158 123* 108* 104* 107*   Cardiac Enzymes:  Recent Labs Lab 01/05/13 1710 01/05/13 2018 01/06/13 0524  TROPONINI <0.30 <0.30 <0.30   BNP (last 3 results) No results found for this basename: PROBNP,  in the last 8760 hours CBG:  Recent Labs Lab 01/05/13 1123  GLUCAP 111*    Recent Results (from the past 240 hour(s))  URINE CULTURE     Status: None   Collection Time    01/05/13  2:01 PM      Result Value Range Status   Specimen Description URINE, RANDOM   Final   Special Requests NONE   Final   Culture  Setup Time     Final   Value:  01/05/2013 19:50     Performed at [facility] Count     Final   Value: >=100,000 COLONIES/ML     Performed at [facility]   Culture     Final   Value: ESCHERICHIA COLI     Performed at [facility]   Report Status 01/08/2013 FINAL   Final   Organism ID, Bacteria ESCHERICHIA COLI   Final     Studies: No results found.  Scheduled Meds: . aspirin  325 mg Oral Daily  . cefTRIAXone (ROCEPHIN)  IV  1 g Intravenous Daily  . docusate sodium  100 mg Oral BID  . feeding supplement (ENSURE COMPLETE)  237 mL Oral Q24H  . sodium bicarbonate  650 mg Oral BID  . sodium chloride  3 mL Intravenous Q12H   Continuous Infusions: . sodium chloride 100 mL/hr at 01/09/13 1023    Principal Problem:   Abdominal pain, unspecified site Active Problems:   Anemia   Acute on chronic renal insufficiency   UTI (lower urinary tract infection)   Delusional disorder(297.1)   Thrombocytopenia, unspecified   Hallucinations    Time spent: > 35 mins    [facility]  [facility] Pager [phone]. If 7PM-7AM, please contact night-coverage at [email or web address], password [email or web address] 01/09/2013, 6:08 PM  LOS: 4 days

## 2013-01-09 NOTE — Progress Notes (Signed)
Clinical Social Work  CSW attempted to meet with patient but patient sleeping at this time. Family in room with patient and asked CSW to follow up when patient is awake.  CSW spoke with RN regarding behaviors. RN reports patient now has IV access and getting medications to assist with agitation.  CSW will continue to follow.  [place], [place] [phone]
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
## 2013-01-09 NOTE — Progress Notes (Signed)
Pt irate this am when informed that for safety reasons, she must be in the alarmed bed or in the [facility], with an alarm. She was informed that it was unsafe for her to lie on the bench beneath the window, especially since she is very unsteady on her feet and has fallen at home. Pt was screaming about "someone shaking the bed" during the night, and she refused to move from the bench multiple times. MD was notified. New orders received. Will continue to monitor pt closely.

## 2013-01-10 LAB — CBC
HCT: 26.1 % — ABNORMAL LOW (ref 36.0–46.0)
Hemoglobin: 8.4 g/dL — ABNORMAL LOW (ref 12.0–15.0)
MCH: 30.7 pg (ref 26.0–34.0)
MCHC: 32.2 g/dL (ref 30.0–36.0)
Platelets: 100 10*3/uL — ABNORMAL LOW (ref 150–400)
RBC: 2.74 MIL/uL — ABNORMAL LOW (ref 3.87–5.11)
RDW: 16.3 % — ABNORMAL HIGH (ref 11.5–15.5)

## 2013-01-10 LAB — BASIC METABOLIC PANEL
BUN: 34 mg/dL — ABNORMAL HIGH (ref 6–23)
Calcium: 8.6 mg/dL (ref 8.4–10.5)
Chloride: 119 mEq/L — ABNORMAL HIGH (ref 96–112)
Creatinine, Ser: 2.15 mg/dL — ABNORMAL HIGH (ref 0.50–1.10)
GFR calc non Af Amer: 21 mL/min — ABNORMAL LOW (ref >90)
Glucose, Bld: 92 mg/dL (ref 70–99)
Sodium: 141 mEq/L (ref 135–145)

## 2013-01-10 MED ORDER — CEFUROXIME AXETIL 250 MG PO TABS
250.0000 mg | ORAL_TABLET | Freq: Two times a day (BID) | ORAL | Status: DC
  Administered 2013-01-11 – 2013-01-12 (×3): 250 mg via ORAL
  Filled 2013-01-10 (×7): qty 1

## 2013-01-10 MED ORDER — SODIUM CHLORIDE 0.45 % IV SOLN
INTRAVENOUS | Status: DC
  Administered 2013-01-10 – 2013-01-12 (×3): via INTRAVENOUS
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
## 2013-01-10 NOTE — Progress Notes (Addendum)
OT Cancellation Note  Patient Details Name: Rhonda [name] MRN: [id number] DOB: [birth year]   Cancelled Treatment:    Reason Eval/Treat Not Completed: Other (comment) Pt very adamant about not getting up with therapy despite max encouragement. She states, "I will get up with my family." Attempted multiple times to encourage pt OOB but pt refused. Sign off for OT.   Lennox Laity [phone] 01/10/2013, 12:33 PM

## 2013-01-10 NOTE — Progress Notes (Signed)
Assumed care of patient from off going RN. No Changes in initial shift assessment. Cont with plan of care. 

## 2013-01-10 NOTE — Progress Notes (Signed)
PT Cancellation Note  Patient Details Name: Rhonda [name] MRN: [id number] DOB: 01-12-1931   Cancelled Treatment:    Reason Eval/Treat Not Completed: Patient declined, no reason specified (Pt/family has declined x 3)   Rada Hay 01/10/2013, 1:07 PM 01/10/2013 Blanchard Kelch PT [phone]  F:

## 2013-01-10 NOTE — Progress Notes (Signed)
[facility] PROGRESS NOTE  Rhonda [name] WGN:[id number] DOB: 1931-02-07 DOA: 01/05/2013 PCP: No primary provider on file.  Assessment/Plan: #1 abdominal pain Questionable etiology. Question secondary to pancreatitis versus UTI. Clinical improvement. Acute abdominal series is negative. Patient on IV Rocephin for Escherichia coli UTI. Abdominal ultrasound negative. Supportive and symptomatic care.  #2 Escherichia coli UTI Change IV Rocephin to oral ceftin tomorrow.  #3 auditory hallucinations Per nursing patient with auditory hallucinations and some agitation. Patient was seen by psychiatry and deemed incompetent. Patient was placed on Haldol as needed for agitation. Due to patient's continued auditory hallucinations will have patient reassessed by psychiatry for further recommendations.  #4 acute on chronic kidney disease Patient's baseline creatinine is 1.5 to about 2. Creatinine slowly creeping up. Urinalysis consistent with a UTI. Continue IV fluids. Follow.  #5 acidosis Likely secondary to chronic kidney disease. Continue bicarbonate tablet.  #6 anemia of chronic disease H&H stable.  #7 moderate nutrition Nutrition follow.  #8 thrombocytopenia Stable. Continue SCDs. Follow.  #9 prophylaxis SCDs for DVT prophylaxis.  Code Status: Full Family Communication: Updated patient and son at bedside. Disposition Plan: Home versus SNF when medically stable   Consultants:  Psychiatry: Dr. Florentina Jenny 01/07/2013  Procedures:  Acute abdominal series 01/05/2013  Antibiotics:  IV Rocephin 01/09/2013  HPI/Subjective: Patient states abdominal pain improved. Patient c/o chronic back pain. Per nursing patient with auditory hallucinations, which patient denies.  Objective: Filed Vitals:   01/10/13 0505  BP: 103/46  Pulse: 63  Temp: 98.5 F (36.9 C)  Resp: 16    Intake/Output Summary (Last 24 hours) at 01/10/13 1453 Last data filed at 01/10/13 0600  Gross per 24 hour   Intake 1431.67 ml  Output    900 ml  Net 531.67 ml   Filed Weights   01/08/13 0502 01/09/13 0526 01/10/13 0500  Weight: 46.9 kg (103 lb 6.3 oz) 48.1 kg (106 lb 0.7 oz) 47.6 kg (104 lb 15 oz)    Exam:   General:  nad  Cardiovascular: rrr  Respiratory: ctab  Abdomen: soft/nt/nd/+bs  Musculoskeletal: no c/c/e  Data Reviewed: Basic Metabolic Panel:  Recent Labs Lab 01/05/13 1710 01/06/13 0524 01/07/13 0440 01/08/13 0425 01/09/13 0450 01/10/13 0500  NA  --  141 138 137 139 141  K  --  3.6 3.7 4.0 4.1 4.1  CL  --  118* 117* 115* 115* 119*  CO2  --  13* 12* 11* 14* 14*  GLUCOSE  --  93 93 93 95 92  BUN  --  25* 30* 35* 34* 34*  CREATININE  --  1.87* 2.03* 2.02* 2.29* 2.15*  CALCIUM  --  8.9 8.9 8.7 9.0 8.6  MG 2.3  --   --   --   --   --   PHOS 3.8  --   --   --   --   --    Liver Function Tests:  Recent Labs Lab 01/05/13 1227  AST 20  ALT 15  ALKPHOS 62  BILITOT 0.5  PROT 6.5  ALBUMIN 3.5    Recent Labs Lab 01/05/13 1227 01/06/13 0524 01/08/13 0425  LIPASE 77* 103* 55   No results found for this basename: AMMONIA,  in the last 168 hours CBC:  Recent Labs Lab 01/06/13 0524 01/07/13 0440 01/08/13 0425 01/09/13 0450 01/10/13 0500  WBC 4.3 3.9* 3.8* 3.9* 2.9*  HGB 9.8* 9.4* 9.3* 9.5* 8.4*  HCT 30.6* 29.3* 28.9* 29.6* 26.1*  MCV 95.0 95.1 94.1 94.9 95.3  PLT  123* 108* 104* 107* 100*   Cardiac Enzymes:  Recent Labs Lab 01/05/13 1710 01/05/13 2018 01/06/13 0524  TROPONINI <0.30 <0.30 <0.30   BNP (last 3 results) No results found for this basename: PROBNP,  in the last 8760 hours CBG:  Recent Labs Lab 01/05/13 1123  GLUCAP 111*    Recent Results (from the past 240 hour(s))  URINE CULTURE     Status: None   Collection Time    01/05/13  2:01 PM      Result Value Range Status   Specimen Description URINE, RANDOM   Final   Special Requests NONE   Final   Culture  Setup Time     Final   Value: 01/05/2013 19:50     Performed at  [facility] Count     Final   Value: >=100,000 COLONIES/ML     Performed at [facility]   Culture     Final   Value: ESCHERICHIA COLI     Performed at [facility]   Report Status 01/08/2013 FINAL   Final   Organism ID, Bacteria ESCHERICHIA COLI   Final     Studies: No results found.  Scheduled Meds: . aspirin  325 mg Oral Daily  . cefTRIAXone (ROCEPHIN)  IV  1 g Intravenous Daily  . docusate sodium  100 mg Oral BID  . feeding supplement (ENSURE COMPLETE)  237 mL Oral Q24H  . morphine  15 mg Oral Q8H  . sodium bicarbonate  650 mg Oral BID  . sodium chloride  3 mL Intravenous Q12H   Continuous Infusions: . sodium chloride      Principal Problem:   Abdominal pain, unspecified site Active Problems:   Anemia   Acute on chronic renal insufficiency   UTI (lower urinary tract infection)   Delusional disorder(297.1)   Thrombocytopenia, unspecified   Hallucinations    Time spent: > 35 mins    [facility]  [facility] Pager [phone]. If 7PM-7AM, please contact night-coverage at [email or web address], password [facility] 01/10/2013, 2:53 PM  LOS: 5 days

## 2013-01-11 LAB — CBC
MCV: 95.3 fL (ref 78.0–100.0)
Platelets: 98 10*3/uL — ABNORMAL LOW (ref 150–400)
RBC: 2.75 MIL/uL — ABNORMAL LOW (ref 3.87–5.11)
RDW: 16.5 % — ABNORMAL HIGH (ref 11.5–15.5)
WBC: 3.1 10*3/uL — ABNORMAL LOW (ref 4.0–10.5)

## 2013-01-11 LAB — BASIC METABOLIC PANEL
CO2: 16 mEq/L — ABNORMAL LOW (ref 19–32)
Calcium: 8.8 mg/dL (ref 8.4–10.5)
Creatinine, Ser: 1.94 mg/dL — ABNORMAL HIGH (ref 0.50–1.10)
GFR calc Af Amer: 27 mL/min — ABNORMAL LOW (ref >90)
GFR calc non Af Amer: 23 mL/min — ABNORMAL LOW (ref >90)
Glucose, Bld: 96 mg/dL (ref 70–99)
Sodium: 140 mEq/L (ref 135–145)

## 2013-01-11 MED ORDER — HALOPERIDOL 2 MG PO TABS
2.0000 mg | ORAL_TABLET | Freq: Every day | ORAL | Status: DC
  Administered 2013-01-11: 22:00:00 2 mg via ORAL
  Filled 2013-01-11 (×2): qty 1

## 2013-01-11 NOTE — Progress Notes (Signed)
[facility] PROGRESS NOTE  GREY SCHLAUCH JXB:[id number] DOB: December 21, 1930 DOA: 01/05/2013 PCP: No primary provider on file.  Assessment/Plan: #1 abdominal pain Questionable etiology. Likely secondary to UTI. Clinical improvement. Acute abdominal series is negative. Patient on antibiotics for Escherichia coli UTI. Abdominal ultrasound negative. Supportive and symptomatic care.  #2 Escherichia coli UTI Continue oral ceftin.  #3 auditory hallucinations Per nursing patient with auditory hallucinations and some agitation. Patient was seen by psychiatry and deemed incompetent. Patient was placed on Haldol as needed for agitation. Patient has been reassessed by psychiatry and recommend haldol daily, with outpatient followup.  #4 acute on chronic kidney disease Patient's baseline creatinine is 1.5 to about 2. Creatinine trending back down. Urinalysis consistent with a UTI. Continue IV fluids. Follow.  #5 acidosis Likely secondary to chronic kidney disease. Continue bicarbonate tablet.  #6 anemia of chronic disease H&H stable.  #7 moderate nutrition Nutrition follow.  #8 thrombocytopenia Stable. Continue SCDs. Follow.  #9 prophylaxis SCDs for DVT prophylaxis.  Code Status: Full Family Communication: Updated patient and son at bedside. Disposition Plan: Home when medically stable   Consultants:  Psychiatry: Dr. Florentina Jenny 01/07/2013  Procedures:  Acute abdominal series 01/05/2013  Antibiotics:  IV Rocephin 01/09/2013--->01/10/13  Oral Ceftin 01/11/2013  HPI/Subjective: Patient states abdominal pain improved. Patient c/o chronic back pain. Patient denies any auditory or visual hallucinations. Patient denies any suicidal or homicidal ideations.  Objective: Filed Vitals:   01/11/13 1419  BP: 104/41  Pulse: 62  Temp: 97.9 F (36.6 C)  Resp: 12    Intake/Output Summary (Last 24 hours) at 01/11/13 1435 Last data filed at 01/11/13 1300  Gross per 24 hour  Intake    1390 ml  Output   3225 ml  Net  -1835 ml   Filed Weights   01/10/13 0500 01/11/13 0527 01/11/13 0903  Weight: 47.6 kg (104 lb 15 oz) 47.9 kg (105 lb 9.6 oz) 48.5 kg (106 lb 14.8 oz)    Exam:   General:  nad  Cardiovascular: rrr  Respiratory: ctab  Abdomen: soft/nt/nd/+bs  Musculoskeletal: no c/c/e  Data Reviewed: Basic Metabolic Panel:  Recent Labs Lab 01/05/13 1710  01/07/13 0440 01/08/13 0425 01/09/13 0450 01/10/13 0500 01/11/13 0530  NA  --   < > 138 137 139 141 140  K  --   < > 3.7 4.0 4.1 4.1 4.1  CL  --   < > 117* 115* 115* 119* 116*  CO2  --   < > 12* 11* 14* 14* 16*  GLUCOSE  --   < > 93 93 95 92 96  BUN  --   < > 30* 35* 34* 34* 30*  CREATININE  --   < > 2.03* 2.02* 2.29* 2.15* 1.94*  CALCIUM  --   < > 8.9 8.7 9.0 8.6 8.8  MG 2.3  --   --   --   --   --   --   PHOS 3.8  --   --   --   --   --   --   < > = values in this interval not displayed. Liver Function Tests:  Recent Labs Lab 01/05/13 1227  AST 20  ALT 15  ALKPHOS 62  BILITOT 0.5  PROT 6.5  ALBUMIN 3.5    Recent Labs Lab 01/05/13 1227 01/06/13 0524 01/08/13 0425  LIPASE 77* 103* 55   No results found for this basename: AMMONIA,  in the last 168 hours CBC:  Recent Labs Lab 01/07/13  1610 01/08/13 0425 01/09/13 0450 01/10/13 0500 01/11/13 0530  WBC 3.9* 3.8* 3.9* 2.9* 3.1*  HGB 9.4* 9.3* 9.5* 8.4* 8.5*  HCT 29.3* 28.9* 29.6* 26.1* 26.2*  MCV 95.1 94.1 94.9 95.3 95.3  PLT 108* 104* 107* 100* 98*   Cardiac Enzymes:  Recent Labs Lab 01/05/13 1710 01/05/13 2018 01/06/13 0524  TROPONINI <0.30 <0.30 <0.30   BNP (last 3 results) No results found for this basename: PROBNP,  in the last 8760 hours CBG:  Recent Labs Lab 01/05/13 1123  GLUCAP 111*    Recent Results (from the past 240 hour(s))  URINE CULTURE     Status: None   Collection Time    01/05/13  2:01 PM      Result Value Range Status   Specimen Description URINE, RANDOM   Final   Special Requests NONE    Final   Culture  Setup Time     Final   Value: 01/05/2013 19:50     Performed at [employer] [facility] Count     Final   Value: >=100,000 COLONIES/ML     Performed at [facility]   Culture     Final   Value: ESCHERICHIA COLI     Performed at [facility]   Report Status 01/08/2013 FINAL   Final   Organism ID, Bacteria ESCHERICHIA COLI   Final     Studies: No results found.  Scheduled Meds: . aspirin  325 mg Oral Daily  . cefUROXime  250 mg Oral BID WC  . docusate sodium  100 mg Oral BID  . feeding supplement (ENSURE COMPLETE)  237 mL Oral Q24H  . haloperidol  2 mg Oral QHS  . morphine  15 mg Oral Q8H  . sodium bicarbonate  650 mg Oral BID  . sodium chloride  3 mL Intravenous Q12H   Continuous Infusions: . sodium chloride 75 mL/hr at 01/11/13 9604    Principal Problem:   Abdominal pain, unspecified site Active Problems:   Anemia   Acute on chronic renal insufficiency   UTI (lower urinary tract infection)   Delusional disorder(297.1)   Thrombocytopenia, unspecified   Hallucinations    Time spent: > 35 mins    [facility]  [facility] Pager [phone]. If 7PM-7AM, please contact night-coverage at [email or web address], password [facility] 01/11/2013, 2:35 PM  LOS: 6 days

## 2013-01-11 NOTE — Progress Notes (Signed)
Clinical Social Work  CSW attempted to meet with patient but patient sleeping and no family present. Per chart review, psych MD recommending outpatient follow up. CSW will continue to follow.  [place], [place] [phone]

## 2013-01-12 DIAGNOSIS — E872 Acidosis: Secondary | ICD-10-CM | POA: Diagnosis present

## 2013-01-12 LAB — BASIC METABOLIC PANEL
CO2: 17 mEq/L — ABNORMAL LOW (ref 19–32)
Calcium: 8.9 mg/dL (ref 8.4–10.5)
GFR calc Af Amer: 26 mL/min — ABNORMAL LOW (ref >90)
Glucose, Bld: 93 mg/dL (ref 70–99)
Sodium: 139 mEq/L (ref 135–145)

## 2013-01-12 MED ORDER — SODIUM BICARBONATE 650 MG PO TABS: 650.0000 mg | ORAL_TABLET | Freq: Two times a day (BID) | ORAL | Status: DC

## 2013-01-12 MED ORDER — CEFUROXIME AXETIL 250 MG PO TABS: 250.0000 mg | ORAL_TABLET | Freq: Two times a day (BID) | ORAL | Status: DC

## 2013-01-12 MED ORDER — ENSURE COMPLETE PO LIQD: 237.0000 mL | ORAL | Status: DC

## 2013-01-12 MED ORDER — MORPHINE SULFATE ER 15 MG PO TBCR: 15.0000 mg | EXTENDED_RELEASE_TABLET | Freq: Three times a day (TID) | ORAL | Status: AC

## 2013-01-12 MED ORDER — HALOPERIDOL 2 MG PO TABS: 2.0000 mg | ORAL_TABLET | Freq: Every day | ORAL | Status: DC

## 2013-01-12 MED ORDER — DSS 100 MG PO CAPS: 100.0000 mg | ORAL_CAPSULE | Freq: Two times a day (BID) | ORAL | Status: DC

## 2013-01-12 NOTE — Progress Notes (Signed)
01/12/2013 1400 Notified [facility] of pt's dc home today. Isidoro Donning RN CCM Case Mgmt phone [phone]

## 2013-01-12 NOTE — Discharge Summary (Signed)
Physician Discharge Summary  Rhonda [name] XBJ:[id number] DOB: July 15, 1930 DOA: 01/05/2013  PCP: No primary provider on file.  Admit date: 01/05/2013 Discharge date: 01/12/2013  Time spent: 65 minutes  Recommendations for Outpatient Follow-up:  1. Patient is to followup with PCP one week post discharge. On followup patient will need a basic metabolic profile done to followup on electrolytes and renal function. 2. Patient will also need to followup with outpatient psychiatry.  Discharge Diagnoses:  Principal Problem:   Abdominal pain, unspecified site Active Problems:   Anemia   Acute on chronic renal insufficiency   E. coli UTI   Delusional disorder(297.1)   Thrombocytopenia, unspecified   Hallucinations   Acidosis   Discharge Condition: Stable and improved  Diet recommendation: Regular  Filed Weights   01/11/13 0527 01/11/13 0903 01/12/13 0609  Weight: 47.9 kg (105 lb 9.6 oz) 48.5 kg (106 lb 14.8 oz) 51.4 kg (113 lb 5.1 oz)    History of present illness:  Pt is 79 yo female who presented to [facility] ED with main concern of persistent abdominal pain, mostly located in the upper quadrants, LUQ > RUQ, 5/10 in severity and with no specific alleviating or aggravating factors. Please note that pt provides somewhat conflicting information and intermittently used chest pain and abdominal pain and on specific questioning at one point denies both. Pt is overall poor historian. She describes chest discomfort, substernal, no specific alleviating or aggravating factors. She is not sure of the exact duration.  In ED, pt is clinically stable, on blood work, lipase elevated and found to be in acute renal failure. [facility] asked to admit for management of presumptive acute pancreatitis and chest pain work up.    Hospital Course:  1 abdominal pain  Patient was admitted admitted with complaints of abdominal pain. Acute abdominal series which was done was negative. Abdominal ultrasound was also done which  was negative. Urinalysis which was done was consistent with a UTI it was felt patient's abdominal pain may likely be secondary to UTI. Patient was started empirically on IV Rocephin while urine cultures are pending. She was also placed on supportive care. Patient improved clinically did not have any further abdominal pain and be discharged on 4 more days of oral Ceftin to complete a one-week course of antibiotic therapy. Patient will followup with PCP as outpatient.  #2 Escherichia coli UTI  On admission urinalysis which was done was consistent with a UTI. Patient was started on IV Rocephin empirically. Urine cultures came back consistent with Escherichia coli UTI. Patient was subsequently transitioned to oral Ceftin which she tolerated. Patient be discharged him on 4 more days of oral Ceftin to complete a one-week course of antibiotic therapy. #3 auditory hallucinations  Per nursing patient with auditory hallucinations and some agitation. Patient was seen by psychiatry and deemed incompetent. Patient was placed on Haldol as needed for agitation. Patient has been reassessed by psychiatry and recommend haldol daily at bedtime. Patient be discharged home on Haldol daily at bedtime and will need outpatient psychiatry followup. Patient's family was in agreement with patient being discharged home. #4 acute on chronic kidney disease  During the hospitalization though some concerns of acute on chronic kidney disease. Abdominal ultrasound which was done was negative for any hydronephrosis are consistent with medical renal disease. Patient was hydrated with some IV fluids. Patient's baseline creatinine is 1.5 to about 2. Patient was also treated for UTI. Patient's creatinine was down to 2 on day of discharge. Patient will followup with PCP  as outpatient. #5 acidosis  Likely secondary to chronic kidney disease. Patient was started on bicarbonate tablet. Will need outpatient followup. #6 anemia of chronic disease   H&H stable.  #7 moderate nutrition  Nutrition following. Patient was started on Ensure.  #8 thrombocytopenia  Stable. Continue SCDs. Follow.      Procedures: Acute abdominal series 01/05/2013 Abdominal ultrasound 01/06/2013    Consultations: Psychiatry: Dr. Florentina Jenny 01/07/2013     Discharge Exam: Filed Vitals:   01/12/13 1314  BP: 108/51  Pulse: 62  Temp: 98.3 F (36.8 C)  Resp: 14    General: NAD Cardiovascular: RRR Respiratory: CTAB  Discharge Instructions      Discharge Orders   Future Orders Complete By Expires   Diet general  As directed    Discharge instructions  As directed    Comments:     Follow up with PCP in 1 week. Follow up with outpatient psychiatry in 1-2 weeks.   Increase activity slowly  As directed        Medication List         aspirin 325 MG tablet  Take 325 mg by mouth daily.     cefUROXime 250 MG tablet  Commonly known as:  CEFTIN  Take 1 tablet (250 mg total) by mouth 2 (two) times daily with a meal. Take for 4 days then stop.     DSS 100 MG Caps  Take 100 mg by mouth 2 (two) times daily.     feeding supplement (ENSURE COMPLETE) Liqd  Take 237 mLs by mouth daily.     haloperidol 2 MG tablet  Commonly known as:  HALDOL  Take 1 tablet (2 mg total) by mouth at bedtime.     morphine 15 MG 12 hr tablet  Commonly known as:  MS CONTIN  Take 1 tablet (15 mg total) by mouth every 8 (eight) hours.     sodium bicarbonate 650 MG tablet  Take 1 tablet (650 mg total) by mouth 2 (two) times daily.       Allergies  Allergen Reactions  . Macrodantin [Nitrofurantoin Macrocrystal] Other (See Comments)    "Makes her fall"  . Septra [Sulfamethoxazole-Trimethoprim] Other (See Comments)    "Makes her fall"  . Soma [Carisoprodol] Other (See Comments)    "Makes her fall"   Follow-up Information   Follow up with Carlyle Lipa, MD. Schedule an appointment as soon as possible for a visit in 1 week.   Specialty:  Internal  Medicine   Contact information:   [address] [place] [address] [phone]       Follow up with [facility]. [facility] RN and Physical Therapy)    Contact information:   [address] [place] [address] [phone]        The results of significant diagnostics from this hospitalization (including imaging, microbiology, ancillary and laboratory) are listed below for reference.    Significant Diagnostic Studies: US Abdomen Complete  01/06/2013   *RADIOLOGY REPORT*  Clinical Data:  Abdominal pain, history of right-sided nephrolithiasis, renal insufficiency and cholecystectomy, initial encounter.  COMPLETE ABDOMINAL ULTRASOUND  Comparison:  CT abdomen pelvis - 09/19/2012, renal ultrasound - 08/29/2012  Findings:  Gallbladder:  Surgically absent  Common bile duct:  Borderline enlarged for age measuring 8.9 mm in diameter, presumably sequelae of postcholecystectomy state.  Liver:  There is mild diffuse slightly coarsened echogenicity of the hepatic parenchyma.  Mild intrahepatic biliary ductal dilatation is suspected, similar to prior  noncontrast abdominal CT. No discrete hepatic lesions.  No definite ascites.  IVC:  Appears normal.  Pancreas:  Limited visualization of the pancreatic head and neck is normal.  Visualization of the pancreatic body and tail is obscured by bowel gas.  Spleen:  Normal in size measuring 8.1 cm in length.  Right Kidney:  Normal length measuring approximately 9.0 cm.  There is mild renal cortical thinning and increased cortical echogenicity suggestive of medical renal disease.  There is an approximately 2.0 x 1.7 x 1.7 cm anechoic exophytic cyst arising from the superior pole of the right kidney, unchanged. No right-sided urinary obstruction.  No definite echogenic renal stones.  Left Kidney:  Atrophic measuring approximately 6.0 cm in length. There is diffuse renal cortical atrophy with increased cortical echogenicity.  There is  an approximately 1.2 x 0.9 x 0.9 cm partially exophytic anechoic structure which arises from the superior pole left kidney which is too small to accurately characterize but likely represents a renal cyst, unchanged to minimally decreased in the interval.  No evidence of left-sided urinary obstruction.  No definite echogenic renal stones.  Abdominal aorta:  There is a minimal amount of eccentric mixed echogenic atherosclerotic plaque within a normal caliber abdominal aorta.  IMPRESSION: 1.  No sonographic explanation for patient's abdominal pain. Specifically, no evidence of urinary obstruction. 2.  The bilateral kidneys appear atrophic, left greater than right, compatible with provided history of medical renal disease.  3.  Unchanged approximately 2 cm exophytic right-sided renal cyst.  4.  Post cholecystectomy with associated mild dilatation of the common bile duct and mild intrahepatic biliary ductal dilatation, presumably the sequela of postcholecystectomy state and similar to prior abdominal CT performed 09/2012.   Original Report Authenticated By: Tacey Ruiz, MD   Dg Abd Acute W/chest  01/05/2013   *RADIOLOGY REPORT*  Clinical Data: Chest and abdominal pain for several days, new onset of weakness, initial encounter.  ACUTE ABDOMEN SERIES (ABDOMEN 2 VIEW & CHEST 1 VIEW)  Comparison: Abdominal radiograph - 10/05/2012; chest radiograph of 10/05/2012; abdominal CT - 09/19/2012  Findings:  Grossly unchanged cardiac silhouette and mediastinal contours.  The lungs remain hyperexpanded.  There is persistent mild elevation of the right hemidiaphragm.  Skin folds overlying the peripheral aspect of the left upper and mid lung.  No definite pneumothorax. No focal airspace opacity.  No pleural effusion or pneumothorax. No evidence of edema.  Paucity of bowel gas without evidence of obstruction.  No pneumoperitoneum, pneumatosis or portal venous gas.  Post cholecystectomy.  Multiple surgical clips overlie the lower  abdomen and pelvis.  Osteopenia.  Mild scoliotic curvature of the thoracolumbar spine. Degenerative change of the right glenohumeral joint.  The right humeral head appears slightly high-riding suggestive of rotator cuff pathology.  IMPRESSION: 1.  Mild lung hyperexpansion without acute cardiopulmonary disease. 2.  Paucity of bowel gas without evidence of obstruction.   Original Report Authenticated By: Tacey Ruiz, MD    Microbiology: Recent Results (from the past 240 hour(s))  URINE CULTURE     Status: None   Collection Time    01/05/13  2:01 PM      Result Value Range Status   Specimen Description URINE, RANDOM   Final   Special Requests NONE   Final   Culture  Setup Time     Final   Value: 01/05/2013 19:50     Performed at [facility]   [place] Count     Final   Value: >=100,000  COLONIES/ML     Performed at [employer]     Final   Value: ESCHERICHIA COLI     Performed at [facility]   Report Status 01/08/2013 FINAL   Final   Organism ID, Bacteria ESCHERICHIA COLI   Final     Labs: Basic Metabolic Panel:  Recent Labs Lab 01/05/13 1710  01/08/13 0425 01/09/13 0450 01/10/13 0500 01/11/13 0530 01/12/13 0540  NA  --   < > 137 139 141 140 139  K  --   < > 4.0 4.1 4.1 4.1 4.0  CL  --   < > 115* 115* 119* 116* 116*  CO2  --   < > 11* 14* 14* 16* 17*  GLUCOSE  --   < > 93 95 92 96 93  BUN  --   < > 35* 34* 34* 30* 25*  CREATININE  --   < > 2.02* 2.29* 2.15* 1.94* 2.00*  CALCIUM  --   < > 8.7 9.0 8.6 8.8 8.9  MG 2.3  --   --   --   --   --   --   PHOS 3.8  --   --   --   --   --   --   < > = values in this interval not displayed. Liver Function Tests: No results found for this basename: AST, ALT, ALKPHOS, BILITOT, PROT, ALBUMIN,  in the last 168 hours  Recent Labs Lab 01/06/13 0524 01/08/13 0425  LIPASE 103* 55   No results found for this basename: AMMONIA,  in the last 168 hours CBC:  Recent Labs Lab 01/07/13 0440  01/08/13 0425 01/09/13 0450 01/10/13 0500 01/11/13 0530  WBC 3.9* 3.8* 3.9* 2.9* 3.1*  HGB 9.4* 9.3* 9.5* 8.4* 8.5*  HCT 29.3* 28.9* 29.6* 26.1* 26.2*  MCV 95.1 94.1 94.9 95.3 95.3  PLT 108* 104* 107* 100* 98*   Cardiac Enzymes:  Recent Labs Lab 01/05/13 1710 01/05/13 2018 01/06/13 0524  TROPONINI <0.30 <0.30 <0.30   BNP: BNP (last 3 results) No results found for this basename: PROBNP,  in the last 8760 hours CBG: No results found for this basename: GLUCAP,  in the last 168 hours     Signed:  THOMPSON,DANIEL  [facility] 01/12/2013, 2:16 PM
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
## 2013-01-31 ENCOUNTER — Emergency Department (HOSPITAL_COMMUNITY)
Admission: EM | Admit: 2013-01-31 | Discharge: 2013-01-31 | Disposition: A | Discharge status: Home / Self Care | Payer: Medicare Other | Attending: Emergency Medicine | Admitting: Emergency Medicine

## 2013-01-31 ENCOUNTER — Encounter (HOSPITAL_COMMUNITY): Payer: Self-pay | Admitting: Emergency Medicine

## 2013-01-31 ENCOUNTER — Emergency Department (HOSPITAL_COMMUNITY): Payer: Medicare Other

## 2013-01-31 DIAGNOSIS — I10 Essential (primary) hypertension: Secondary | ICD-10-CM | POA: Insufficient documentation

## 2013-01-31 DIAGNOSIS — Z8739 Personal history of other diseases of the musculoskeletal system and connective tissue: Secondary | ICD-10-CM | POA: Insufficient documentation

## 2013-01-31 DIAGNOSIS — Z862 Personal history of diseases of the blood and blood-forming organs and certain disorders involving the immune mechanism: Secondary | ICD-10-CM | POA: Insufficient documentation

## 2013-01-31 DIAGNOSIS — G8929 Other chronic pain: Secondary | ICD-10-CM | POA: Insufficient documentation

## 2013-01-31 DIAGNOSIS — N390 Urinary tract infection, site not specified: Secondary | ICD-10-CM | POA: Insufficient documentation

## 2013-01-31 DIAGNOSIS — I252 Old myocardial infarction: Secondary | ICD-10-CM | POA: Insufficient documentation

## 2013-01-31 DIAGNOSIS — Z8639 Personal history of other endocrine, nutritional and metabolic disease: Secondary | ICD-10-CM | POA: Insufficient documentation

## 2013-01-31 DIAGNOSIS — Z8659 Personal history of other mental and behavioral disorders: Secondary | ICD-10-CM | POA: Insufficient documentation

## 2013-01-31 DIAGNOSIS — Z8719 Personal history of other diseases of the digestive system: Secondary | ICD-10-CM | POA: Insufficient documentation

## 2013-01-31 DIAGNOSIS — Z79899 Other long term (current) drug therapy: Secondary | ICD-10-CM | POA: Insufficient documentation

## 2013-01-31 DIAGNOSIS — Z8701 Personal history of pneumonia (recurrent): Secondary | ICD-10-CM | POA: Insufficient documentation

## 2013-01-31 DIAGNOSIS — Z7982 Long term (current) use of aspirin: Secondary | ICD-10-CM | POA: Insufficient documentation

## 2013-01-31 LAB — URINE MICROSCOPIC-ADD ON

## 2013-01-31 LAB — CBC WITH DIFFERENTIAL/PLATELET
Basophils Absolute: 0 10*3/uL (ref 0.0–0.1)
Basophils Relative: 0 % (ref 0–1)
Eosinophils Absolute: 0.1 10*3/uL (ref 0.0–0.7)
Eosinophils Relative: 3 % (ref 0–5)
HCT: 29.9 % — ABNORMAL LOW (ref 36.0–46.0)
Hemoglobin: 9.4 g/dL — ABNORMAL LOW (ref 12.0–15.0)
Lymphocytes Relative: 28 % (ref 12–46)
Lymphs Abs: 0.7 10*3/uL (ref 0.7–4.0)
MCH: 30 pg (ref 26.0–34.0)
MCHC: 31.4 g/dL (ref 30.0–36.0)
MCV: 95.5 fL (ref 78.0–100.0)
Monocytes Absolute: 0.2 10*3/uL (ref 0.1–1.0)
Monocytes Relative: 10 % (ref 3–12)
Neutro Abs: 1.4 10*3/uL — ABNORMAL LOW (ref 1.7–7.7)
Neutrophils Relative %: 59 % (ref 43–77)
Platelets: 132 10*3/uL — ABNORMAL LOW (ref 150–400)
RBC: 3.13 MIL/uL — ABNORMAL LOW (ref 3.87–5.11)
RDW: 14.6 % (ref 11.5–15.5)
WBC Morphology: INCREASED
WBC: 2.4 10*3/uL — ABNORMAL LOW (ref 4.0–10.5)

## 2013-01-31 LAB — COMPREHENSIVE METABOLIC PANEL
ALT: 6 U/L (ref 0–35)
AST: 14 U/L (ref 0–37)
Albumin: 3.4 g/dL — ABNORMAL LOW (ref 3.5–5.2)
Alkaline Phosphatase: 54 U/L (ref 39–117)
BUN: 21 mg/dL (ref 6–23)
CO2: 18 mEq/L — ABNORMAL LOW (ref 19–32)
Calcium: 9.6 mg/dL (ref 8.4–10.5)
Chloride: 114 mEq/L — ABNORMAL HIGH (ref 96–112)
Creatinine, Ser: 2.01 mg/dL — ABNORMAL HIGH (ref 0.50–1.10)
GFR calc Af Amer: 26 mL/min — ABNORMAL LOW (ref >90)
GFR calc non Af Amer: 22 mL/min — ABNORMAL LOW (ref >90)
Glucose, Bld: 89 mg/dL (ref 70–99)
Potassium: 3.5 mEq/L (ref 3.5–5.1)
Sodium: 141 mEq/L (ref 135–145)
Total Bilirubin: 0.3 mg/dL (ref 0.3–1.2)
Total Protein: 6 g/dL (ref 6.0–8.3)

## 2013-01-31 LAB — URINALYSIS, ROUTINE W REFLEX MICROSCOPIC
Bilirubin Urine: NEGATIVE
Glucose, UA: NEGATIVE mg/dL
Hgb urine dipstick: NEGATIVE
Ketones, ur: NEGATIVE mg/dL
Nitrite: POSITIVE — AB
Protein, ur: 100 mg/dL — AB
Specific Gravity, Urine: 1.016 (ref 1.005–1.030)
Urobilinogen, UA: 0.2 mg/dL (ref 0.0–1.0)
pH: 6.5 (ref 5.0–8.0)

## 2013-01-31 MED ORDER — DEXTROSE 5 % IV SOLN
1.0000 g | Freq: Once | INTRAVENOUS | Status: AC
  Administered 2013-01-31: 1 g via INTRAVENOUS
  Filled 2013-01-31: qty 10

## 2013-01-31 MED ORDER — CEPHALEXIN 500 MG PO CAPS: 500.0000 mg | ORAL_CAPSULE | Freq: Four times a day (QID) | ORAL | Status: DC

## 2013-01-31 MED ORDER — MORPHINE SULFATE 4 MG/ML IJ SOLN
4.0000 mg | Freq: Once | INTRAMUSCULAR | Status: AC
  Administered 2013-01-31: 4 mg via INTRAMUSCULAR
  Filled 2013-01-31: qty 1

## 2013-01-31 MED ORDER — HYDROCODONE-ACETAMINOPHEN 5-325 MG PO TABS
1.0000 | ORAL_TABLET | Freq: Once | ORAL | Status: DC
  Filled 2013-01-31: qty 1

## 2013-01-31 MED ORDER — HYDROCODONE-ACETAMINOPHEN 5-325 MG PO TABS: 1.0000 | ORAL_TABLET | Freq: Four times a day (QID) | ORAL | Status: DC | PRN

## 2013-01-31 NOTE — ED Notes (Signed)
Pt does not have a bladder no in and out done pt has a ureterostomy
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
## 2013-02-02 LAB — URINE CULTURE: Colony Count: >=100000

## 2013-02-04 ENCOUNTER — Telehealth (HOSPITAL_COMMUNITY): Payer: Self-pay | Admitting: *Deleted

## 2013-02-04 NOTE — Progress Notes (Signed)
ED Antimicrobial Stewardship Positive Culture Follow Up   Rhonda [name] is an 79 y.o. female who presented to [facility] on 01/31/2013 with a chief complaint of  Chief Complaint  Patient presents with  . Vaginal Pain    Recent Results (from the past 720 hour(s))  URINE CULTURE     Status: None   Collection Time    01/05/13  2:01 PM      Result Value Range Status   Specimen Description URINE, RANDOM   Final   Special Requests NONE   Final   Culture  Setup Time     Final   Value: 01/05/2013 19:50     Performed at [facility] Count     Final   Value: >=100,000 COLONIES/ML     Performed at [facility]   Culture     Final   Value: ESCHERICHIA COLI     Performed at [facility]   Report Status 01/08/2013 FINAL   Final   Organism ID, Bacteria ESCHERICHIA COLI   Final  URINE CULTURE     Status: None   Collection Time    01/31/13  7:10 PM      Result Value Range Status   Specimen Description URINE, CLEAN CATCH   Final   Special Requests NONE   Final   Culture  Setup Time     Final   Value: 01/31/2013 23:35     Performed at [facility] Count     Final   Value: >=100,000 COLONIES/ML     Performed at [facility]   Culture     Final   Value: ESCHERICHIA COLI     Performed at [facility]   Report Status 02/02/2013 FINAL   Final   Organism ID, Bacteria ESCHERICHIA COLI   Final    [x]  Treated with Keflex, organism resistant to prescribed antimicrobial  New antibiotic prescription: Vantin (cefpodoxime) 200mg  PO qday x 7 days.  Patient should stop Keflex.  ED Provider: Arthor Captain, PA-C   Sallee Provencal 02/04/2013, 11:44 AM Infectious Diseases Pharmacist Phone# [phone]

## 2013-02-05 NOTE — ED Notes (Signed)
Patient requests that rx be called to [facility] [phone]

## 2013-02-05 NOTE — ED Notes (Signed)
Rx called by Purnell Shoemaker PFM
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
## 2013-02-12 ENCOUNTER — Inpatient Hospital Stay (HOSPITAL_COMMUNITY)
Admission: EM | Admit: 2013-02-12 | Discharge: 2013-02-16 | DRG: 392 | Disposition: A | Discharge status: Home / Self Care | Payer: Medicare Other | Attending: Internal Medicine | Admitting: Internal Medicine

## 2013-02-12 ENCOUNTER — Encounter (HOSPITAL_COMMUNITY): Payer: Self-pay | Admitting: Emergency Medicine

## 2013-02-12 ENCOUNTER — Emergency Department (HOSPITAL_COMMUNITY): Payer: Medicare Other

## 2013-02-12 DIAGNOSIS — I959 Hypotension, unspecified: Secondary | ICD-10-CM | POA: Diagnosis present

## 2013-02-12 DIAGNOSIS — N184 Chronic kidney disease, stage 4 (severe): Secondary | ICD-10-CM | POA: Diagnosis present

## 2013-02-12 DIAGNOSIS — I129 Hypertensive chronic kidney disease with stage 1 through stage 4 chronic kidney disease, or unspecified chronic kidney disease: Secondary | ICD-10-CM | POA: Diagnosis present

## 2013-02-12 DIAGNOSIS — D649 Anemia, unspecified: Secondary | ICD-10-CM

## 2013-02-12 DIAGNOSIS — I359 Nonrheumatic aortic valve disorder, unspecified: Secondary | ICD-10-CM

## 2013-02-12 DIAGNOSIS — I252 Old myocardial infarction: Secondary | ICD-10-CM

## 2013-02-12 DIAGNOSIS — R109 Unspecified abdominal pain: Secondary | ICD-10-CM

## 2013-02-12 DIAGNOSIS — F259 Schizoaffective disorder, unspecified: Secondary | ICD-10-CM | POA: Diagnosis present

## 2013-02-12 DIAGNOSIS — Z7982 Long term (current) use of aspirin: Secondary | ICD-10-CM

## 2013-02-12 DIAGNOSIS — M129 Arthropathy, unspecified: Secondary | ICD-10-CM | POA: Diagnosis present

## 2013-02-12 DIAGNOSIS — K529 Noninfective gastroenteritis and colitis, unspecified: Secondary | ICD-10-CM

## 2013-02-12 DIAGNOSIS — F22 Delusional disorders: Secondary | ICD-10-CM | POA: Diagnosis present

## 2013-02-12 DIAGNOSIS — N390 Urinary tract infection, site not specified: Secondary | ICD-10-CM | POA: Diagnosis present

## 2013-02-12 DIAGNOSIS — R0789 Other chest pain: Secondary | ICD-10-CM | POA: Diagnosis present

## 2013-02-12 DIAGNOSIS — I10 Essential (primary) hypertension: Secondary | ICD-10-CM | POA: Diagnosis present

## 2013-02-12 DIAGNOSIS — D61818 Other pancytopenia: Secondary | ICD-10-CM | POA: Diagnosis present

## 2013-02-12 DIAGNOSIS — K5289 Other specified noninfective gastroenteritis and colitis: Principal | ICD-10-CM | POA: Diagnosis present

## 2013-02-12 DIAGNOSIS — B9620 Unspecified Escherichia coli [E. coli] as the cause of diseases classified elsewhere: Secondary | ICD-10-CM

## 2013-02-12 DIAGNOSIS — G8929 Other chronic pain: Secondary | ICD-10-CM | POA: Diagnosis present

## 2013-02-12 DIAGNOSIS — M549 Dorsalgia, unspecified: Secondary | ICD-10-CM | POA: Diagnosis present

## 2013-02-12 DIAGNOSIS — A498 Other bacterial infections of unspecified site: Secondary | ICD-10-CM | POA: Diagnosis present

## 2013-02-12 DIAGNOSIS — F209 Schizophrenia, unspecified: Secondary | ICD-10-CM | POA: Diagnosis present

## 2013-02-12 DIAGNOSIS — R911 Solitary pulmonary nodule: Secondary | ICD-10-CM | POA: Diagnosis present

## 2013-02-12 HISTORY — DX: Diverticulosis of intestine, part unspecified, without perforation or abscess without bleeding: K57.90

## 2013-02-12 HISTORY — DX: Cerebral infarction, unspecified: I63.9

## 2013-02-12 HISTORY — DX: Disease of esophagus, unspecified: K22.9

## 2013-02-12 HISTORY — DX: Other disorders of lung: J98.4

## 2013-02-12 HISTORY — DX: Chronic kidney disease, unspecified: N18.9

## 2013-02-12 LAB — CBC
HCT: 32 % — ABNORMAL LOW (ref 36.0–46.0)
HCT: 27.2 % — ABNORMAL LOW (ref 36.0–46.0)
Hemoglobin: 8.9 g/dL — ABNORMAL LOW (ref 12.0–15.0)
MCH: 30.6 pg (ref 26.0–34.0)
MCHC: 32.7 g/dL (ref 30.0–36.0)
MCV: 92.8 fL (ref 78.0–100.0)
Platelets: 141 10*3/uL — ABNORMAL LOW (ref 150–400)
RBC: 3.45 MIL/uL — ABNORMAL LOW (ref 3.87–5.11)
WBC: 4.8 10*3/uL (ref 4.0–10.5)
WBC: 2.8 10*3/uL — ABNORMAL LOW (ref 4.0–10.5)

## 2013-02-12 LAB — URINALYSIS, ROUTINE W REFLEX MICROSCOPIC
Bilirubin Urine: NEGATIVE
Hgb urine dipstick: NEGATIVE
Ketones, ur: NEGATIVE mg/dL
Nitrite: POSITIVE — AB
Specific Gravity, Urine: 1.014 (ref 1.005–1.030)
pH: 6.5 (ref 5.0–8.0)

## 2013-02-12 LAB — POCT I-STAT, CHEM 8
BUN: 27 mg/dL — ABNORMAL HIGH (ref 6–23)
Calcium, Ion: 1.31 mmol/L — ABNORMAL HIGH (ref 1.13–1.30)
Glucose, Bld: 92 mg/dL (ref 70–99)
HCT: 33 % — ABNORMAL LOW (ref 36.0–46.0)
Hemoglobin: 11.2 g/dL — ABNORMAL LOW (ref 12.0–15.0)
Sodium: 145 mEq/L (ref 135–145)
TCO2: 15 mmol/L (ref 0–100)

## 2013-02-12 LAB — TROPONIN I
Troponin I: <0.3 ng/mL (ref <0.30)
Troponin I: <0.3 ng/mL (ref <0.30)

## 2013-02-12 LAB — LIPID PANEL
Cholesterol: 104 mg/dL (ref 0–200)
Triglycerides: 60 mg/dL (ref <150)

## 2013-02-12 LAB — POCT I-STAT TROPONIN I: Troponin i, poc: 0.01 ng/mL (ref 0.00–0.08)

## 2013-02-12 LAB — URINE MICROSCOPIC-ADD ON

## 2013-02-12 LAB — TSH: TSH: 0.724 u[IU]/mL (ref 0.350–4.500)

## 2013-02-12 MED ORDER — SODIUM CHLORIDE 0.9 % IJ SOLN
3.0000 mL | Freq: Two times a day (BID) | INTRAMUSCULAR | Status: DC
  Administered 2013-02-12 – 2013-02-15 (×4): 3 mL via INTRAVENOUS

## 2013-02-12 MED ORDER — ASPIRIN 81 MG PO CHEW
324.0000 mg | CHEWABLE_TABLET | Freq: Once | ORAL | Status: AC
  Administered 2013-02-12: 324 mg via ORAL
  Filled 2013-02-12: qty 4

## 2013-02-12 MED ORDER — HYDROCODONE-ACETAMINOPHEN 5-325 MG PO TABS
1.0000 | ORAL_TABLET | Freq: Four times a day (QID) | ORAL | Status: DC | PRN
  Administered 2013-02-13 – 2013-02-16 (×3): 1 via ORAL
  Filled 2013-02-12 (×3): qty 1

## 2013-02-12 MED ORDER — HYDROMORPHONE HCL PF 1 MG/ML IJ SOLN
1.0000 mg | INTRAMUSCULAR | Status: DC | PRN
  Administered 2013-02-12 – 2013-02-13 (×4): 1 mg via INTRAVENOUS
  Filled 2013-02-12 (×7): qty 1

## 2013-02-12 MED ORDER — SODIUM CHLORIDE 0.9 % IJ SOLN
3.0000 mL | Freq: Two times a day (BID) | INTRAMUSCULAR | Status: DC
  Administered 2013-02-12 – 2013-02-16 (×8): 3 mL via INTRAVENOUS

## 2013-02-12 MED ORDER — ONDANSETRON HCL 4 MG/2ML IJ SOLN: 4.0000 mg | Freq: Four times a day (QID) | INTRAMUSCULAR | Status: DC | PRN

## 2013-02-12 MED ORDER — MORPHINE SULFATE ER 15 MG PO TBCR
15.0000 mg | EXTENDED_RELEASE_TABLET | Freq: Three times a day (TID) | ORAL | Status: DC
  Administered 2013-02-12 – 2013-02-16 (×13): 15 mg via ORAL
  Filled 2013-02-12 (×13): qty 1

## 2013-02-12 MED ORDER — METOPROLOL TARTRATE 25 MG PO TABS
25.0000 mg | ORAL_TABLET | Freq: Two times a day (BID) | ORAL | Status: DC
  Administered 2013-02-12 – 2013-02-13 (×4): 25 mg via ORAL
  Filled 2013-02-12 (×6): qty 1

## 2013-02-12 MED ORDER — MORPHINE SULFATE 2 MG/ML IJ SOLN
2.0000 mg | Freq: Once | INTRAMUSCULAR | Status: AC
  Administered 2013-02-12: 2 mg via INTRAVENOUS
  Filled 2013-02-12: qty 1

## 2013-02-12 MED ORDER — ONDANSETRON HCL 4 MG/2ML IJ SOLN
4.0000 mg | Freq: Once | INTRAMUSCULAR | Status: AC
  Administered 2013-02-12: 4 mg via INTRAVENOUS
  Filled 2013-02-12: qty 2

## 2013-02-12 MED ORDER — NITROGLYCERIN 0.4 MG SL SUBL
0.4000 mg | SUBLINGUAL_TABLET | SUBLINGUAL | Status: DC | PRN
  Administered 2013-02-12: 0.4 mg via SUBLINGUAL

## 2013-02-12 MED ORDER — HEPARIN SODIUM (PORCINE) 5000 UNIT/ML IJ SOLN
5000.0000 [IU] | Freq: Three times a day (TID) | INTRAMUSCULAR | Status: DC
  Administered 2013-02-12 – 2013-02-15 (×11): 5000 [IU] via SUBCUTANEOUS
  Filled 2013-02-12 (×14): qty 1

## 2013-02-12 MED ORDER — ASPIRIN 325 MG PO TABS
325.0000 mg | ORAL_TABLET | Freq: Every day | ORAL | Status: DC
  Administered 2013-02-12 – 2013-02-15 (×4): 325 mg via ORAL
  Filled 2013-02-12 (×5): qty 1

## 2013-02-12 MED ORDER — DOCUSATE SODIUM 100 MG PO CAPS
100.0000 mg | ORAL_CAPSULE | Freq: Two times a day (BID) | ORAL | Status: DC
  Administered 2013-02-12 – 2013-02-16 (×9): 100 mg via ORAL
  Filled 2013-02-12 (×10): qty 1

## 2013-02-12 MED ORDER — NITROGLYCERIN 0.4 MG SL SUBL
SUBLINGUAL_TABLET | SUBLINGUAL | Status: AC
  Filled 2013-02-12: qty 25

## 2013-02-12 MED ORDER — DEXTROSE 5 % IV SOLN
1.0000 g | INTRAVENOUS | Status: DC
  Administered 2013-02-12 – 2013-02-14 (×3): 1 g via INTRAVENOUS
  Filled 2013-02-12 (×3): qty 10

## 2013-02-12 MED ORDER — SODIUM CHLORIDE 0.9 % IV SOLN: 250.0000 mL | INTRAVENOUS | Status: DC | PRN

## 2013-02-12 MED ORDER — ATORVASTATIN CALCIUM 10 MG PO TABS
10.0000 mg | ORAL_TABLET | Freq: Every day | ORAL | Status: DC
Start: 2013-02-12 — End: 2013-02-16
  Administered 2013-02-12 – 2013-02-15 (×4): 10 mg via ORAL
  Filled 2013-02-12 (×5): qty 1

## 2013-02-12 MED ORDER — SODIUM CHLORIDE 0.9 % IJ SOLN: 3.0000 mL | INTRAMUSCULAR | Status: DC | PRN

## 2013-02-12 MED ORDER — ONDANSETRON HCL 4 MG PO TABS: 4.0000 mg | ORAL_TABLET | Freq: Four times a day (QID) | ORAL | Status: DC | PRN

## 2013-02-12 NOTE — Progress Notes (Signed)
Subjective: Patient admitted this morning. No pain at this time. Had abnormal UA and UTI two weeks ago but did not get treated with correct antibiotics per patient. Filed Vitals:   02/12/13 1554  BP: 103/47  Pulse: 48  Temp: 98.8 F (37.1 C)  Resp: 16    Chest: Clear Bilaterally Heart : S1S2 RRR Abdomen: Soft, nontender Ext : No edema Neuro: Alert, oriented x 3  A/P Atypical chest pain ? UTI  Continue to monitor the cardiac enzymes, will also obtain UA and Urine culture. Will start empiric Rocephin based on previous culture results.    Meredeth Ide [facility] [phone]

## 2013-02-12 NOTE — ED Notes (Signed)
Patient states that she had a sudden onset of chest pain tonight similar to chest pain in the past. Patient has history of 4 MIs in the past.

## 2013-02-12 NOTE — Progress Notes (Signed)
Echocardiogram 2D Echocardiogram has been performed.  Rhonda [name] 02/12/2013, 3:53 PM

## 2013-02-12 NOTE — Progress Notes (Signed)
Report called to floor

## 2013-02-12 NOTE — ED Provider Notes (Signed)
CSN: [id number]     Arrival date & time 02/12/13  0207 History   First MD Initiated Contact with Patient 02/12/13 0215     Chief Complaint  Patient presents with  . Chest Pain   (Consider location/radiation/quality/duration/timing/severity/associated sxs/prior Treatment) HPI Hx per PT - woke about 2 hours PTA with CP R sided radiates across her chest, dull and aching in quality, mod to severe with some nausea and SOB, states h/o MI in the past.  No aggravating or alleviating factors. No cough or fever, no leg pain or swelling. BIB husband.   Past Medical History  Diagnosis Date  . Hypertension   . Anemia   . Arthritis   . Pneumonia   . Goiter   . Schizo affective schizophrenia   . Myocardial infarct   . Chronic back pain   . Ventral hernia    Past Surgical History  Procedure Laterality Date  . Abdominal hysterectomy    . Back surgery    . Hernia repair    . Cholecystectomy    . Revision urostomy cutaneous     Family History  Problem Relation Age of Onset  . Alzheimer's disease Mother    History  Substance Use Topics  . Smoking status: Never Smoker   . Smokeless tobacco: Never Used  . Alcohol Use: No   OB History   Grav Para Term Preterm Abortions TAB SAB Ect Mult Living                 Review of Systems  Constitutional: Negative for fever and chills.  Respiratory: Positive for shortness of breath.   Cardiovascular: Positive for chest pain.  Gastrointestinal: Positive for nausea. Negative for vomiting and abdominal pain.  Genitourinary: Negative for flank pain.  Musculoskeletal: Negative for back pain, neck pain and neck stiffness.  Skin: Negative for rash.  Neurological: Negative for headaches.  All other systems reviewed and are negative.    Allergies  Macrodantin; Septra; and Soma  Home Medications   Current Outpatient Rx  Name  Route  Sig  Dispense  Refill  . aspirin 325 MG tablet   Oral   Take 325 mg by mouth daily.         Marland Kitchen docusate  sodium 100 MG CAPS   Oral   Take 100 mg by mouth 2 (two) times daily.   10 capsule   0   . HYDROcodone-acetaminophen (NORCO/VICODIN) 5-325 MG per tablet   Oral   Take 1 tablet by mouth every 6 (six) hours as needed for pain.   15 tablet   0   . morphine (MS CONTIN) 15 MG 12 hr tablet   Oral   Take 1 tablet (15 mg total) by mouth every 8 (eight) hours.   30 tablet   0   . cephALEXin (KEFLEX) 500 MG capsule   Oral   Take 1 capsule (500 mg total) by mouth 4 (four) times daily.   28 capsule   0    BP 163/77  Pulse 59  Temp(Src) 97.6 F (36.4 C) (Oral)  SpO2 100% Physical Exam  Nursing note and vitals reviewed. Constitutional: She is oriented to person, place, and time. She appears well-developed and well-nourished.  HENT:  Head: Normocephalic and atraumatic.  Eyes: EOM are normal. Pupils are equal, round, and reactive to light.  Neck: Neck supple.  Cardiovascular: Regular rhythm and intact distal pulses.   Bradycardic 50s  Pulmonary/Chest: Effort normal and breath sounds normal. No respiratory distress.  Abdominal:  Soft. Bowel sounds are normal. She exhibits no distension. There is no tenderness.  Musculoskeletal: Normal range of motion. She exhibits no tenderness.  Neurological: She is alert and oriented to person, place, and time.  Skin: Skin is warm and dry.    ED Course  Procedures (including critical care time) Labs Review Labs Reviewed  CBC - Abnormal; Notable for the following:    RBC 3.45 (*)    Hemoglobin 10.7 (*)    HCT 32.0 (*)    Platelets 141 (*)    All other components within normal limits  POCT I-STAT, CHEM 8 - Abnormal; Notable for the following:    Chloride 119 (*)    BUN 27 (*)    Creatinine, Ser 2.20 (*)    Calcium, Ion 1.31 (*)    Hemoglobin 11.2 (*)    HCT 33.0 (*)    All other components within normal limits  POCT I-STAT TROPONIN I   Imaging Review Dg Chest Portable 1 View  02/12/2013   CLINICAL DATA:  Sudden onset of chest pain   EXAM: PORTABLE CHEST - 1 VIEW  COMPARISON:  Prior radiograph from 01/31/2013 as well as earlier studies.  FINDINGS: The cardiac and mediastinal silhouettes are stable in size and contour, and remain within normal limits.  Lungs are normally inflated. Diffuse prominence of the interstitial markings is stable as compared to the prior exam. No focal infiltrate, pulmonary edema, or pleural effusion is identified no pneumothorax.  Osseous structures are unchanged.  IMPRESSION: Stable appearance of chronic lung changes. No acute cardiopulmonary process identified   Electronically Signed   By: Rise Mu M.D.   On: 02/12/2013 03:21    EKG Interpretation     Ventricular Rate:  56 PR Interval:  241 QRS Duration: 68 QT Interval:  473 QTC Calculation: 456 R Axis:   69 Text Interpretation:  Sinus rhythm Prolonged PR interval Nonspecific ST abnormality LVH with secondary repolarization abnormality Abnormal ekg           ASA. IVmorophine  MED consult - DR Conley Rolls to admit MDM  DX: Chest pain  ECG, labs, CXR ASA. IV narcotics Prior records reviewed with recent admits for CP/ ABD pain, noted PSY history, no recent CAR work up beyond ECG/ troponins MED admit    Sunnie Nielsen, MD 02/12/13 [phone]

## 2013-02-12 NOTE — H&P (Signed)
[facility] History and Physical  QIARA MINETTI MVH:[id number] DOB: 1931/03/20    PCP:   None.  Chief Complaint: substernal chest pain at rest.  HPI: Rhonda [name] is an 79 y.o. female with hx of prior MI (4 MI's according to her, cardiologist in [facility]), hx of schizophrenia and delusional disorder, anemia, HTN, chronic back pain on long acting morphine, presents to the ER with substernal chest pain lasting one hour while trying to go to sleep.  She denied diaphoresis, nausea, or vomiting, but admitted to mild shortness of breath.  There has been no fever, chills, coughs, abdominal pain, black or bloody stool.  Evalatuion in the ER included an EKG with NSR and no acute ST-T changes.  Her initial troponin was negative.  CXR was clear, but her Cr was 2.2 (not new:  10/14= 2.1,  7/14=1.85).  She is painfree upon arrival to the ER.  In the past, she had refused a heart catherization.  Hospitalist was asked to admit her for chest pain, r/out.  Rewiew of Systems:  Constitutional: Negative for malaise, fever and chills. No significant weight loss or weight gain Eyes: Negative for eye pain, redness and discharge, diplopia, visual changes, or flashes of light. ENMT: Negative for ear pain, hoarseness, nasal congestion, sinus pressure and sore throat. No headaches; tinnitus, drooling, or problem swallowing. Cardiovascular: Negative for palpitations, diaphoresis,  and peripheral edema. ; No orthopnea, PND Respiratory: Negative for cough, hemoptysis, wheezing and stridor. No pleuritic chestpain. Gastrointestinal: Negative for nausea, vomiting, diarrhea, constipation, abdominal pain, melena, blood in stool, hematemesis, jaundice and rectal bleeding.    Genitourinary: Negative for frequency, dysuria, incontinence,flank pain and hematuria; Musculoskeletal: Negative for back pain and neck pain. Negative for swelling and trauma.;  Skin: . Negative for pruritus, rash, abrasions, bruising and skin  lesion.; ulcerations Neuro: Negative for headache, lightheadedness and neck stiffness. Negative for weakness, altered level of consciousness , altered mental status, extremity weakness, burning feet, involuntary movement, seizure and syncope.  Psych: negative for nsomnia, tearfulness, panic attacks, suicidal or homicidal ideation   Past Medical History  Diagnosis Date  . Hypertension   . Anemia   . Arthritis   . Pneumonia   . Goiter   . Schizo affective schizophrenia   . Myocardial infarct   . Chronic back pain   . Ventral hernia     Past Surgical History  Procedure Laterality Date  . Abdominal hysterectomy    . Back surgery    . Hernia repair    . Cholecystectomy    . Revision urostomy cutaneous      Medications:  HOME MEDS: Prior to Admission medications   Medication Sig Start Date End Date Taking? Authorizing Provider  aspirin 325 MG tablet Take 325 mg by mouth daily.   Yes Historical Provider, MD  docusate sodium 100 MG CAPS Take 100 mg by mouth 2 (two) times daily. 01/12/13  Yes Rodolph Bong, MD  HYDROcodone-acetaminophen (NORCO/VICODIN) 5-325 MG per tablet Take 1 tablet by mouth every 6 (six) hours as needed for pain. 01/31/13  Yes Jamesetta Orleans Lawyer, PA-C  morphine (MS CONTIN) 15 MG 12 hr tablet Take 1 tablet (15 mg total) by mouth every 8 (eight) hours. 01/12/13  Yes Rodolph Bong, MD  cephALEXin (KEFLEX) 500 MG capsule Take 1 capsule (500 mg total) by mouth 4 (four) times daily. 01/31/13   Carlyle Dolly, PA-C     Allergies:  Allergies  Allergen Reactions  . Macrodantin [Nitrofurantoin Macrocrystal] Other (  See Comments)    "Makes her fall"  . Septra [Sulfamethoxazole-Trimethoprim] Other (See Comments)    "Makes her fall"  . Soma [Carisoprodol] Other (See Comments)    "Makes her fall"    Social History:   reports that she has never smoked. She has never used smokeless tobacco. She reports that she does not drink alcohol or use illicit  drugs.  Family History: Family History  Problem Relation Age of Onset  . Alzheimer's disease Mother      Physical Exam: Filed Vitals:   02/12/13 0213 02/12/13 0239  BP: 163/77   Pulse: 59   Temp: 97.6 F (36.4 C)   TempSrc: Oral   SpO2: 99% 100%   Blood pressure 163/77, pulse 59, temperature 97.6 F (36.4 C), temperature source Oral, SpO2 100.00%.  GEN:  Pleasant  patient lying in the stretcher in no acute distress; cooperative with exam. PSYCH:  alert and oriented x4; does not appear anxious or depressed; affect is appropriate. HEENT: Mucous membranes pink and anicteric; PERRLA; EOM intact; no cervical lymphadenopathy nor thyromegaly or carotid bruit; no JVD; There were no stridor. Neck is very supple. Breasts:: Not examined CHEST WALL: No tenderness CHEST: Normal respiration, clear to auscultation bilaterally.  HEART: Regular rate and rhythm.  There are no murmur, rub, or gallops.   BACK: No kyphosis or scoliosis; no CVA tenderness ABDOMEN: soft and non-tender; no masses, no organomegaly, normal abdominal bowel sounds; no pannus; no intertriginous candida. There is no rebound and no distention. Rectal Exam: Not done EXTREMITIES: No bone or joint deformity; age-appropriate arthropathy of the hands and knees; no edema; no ulcerations.  There is no calf tenderness. Genitalia: not examined PULSES: 2+ and symmetric SKIN: Normal hydration no rash or ulceration CNS: Cranial nerves 2-12 grossly intact no focal lateralizing neurologic deficit.  Speech is fluent; uvula elevated with phonation, facial symmetry and tongue midline. DTR are normal bilaterally, cerebella exam is intact, barbinski is negative and strengths are equaled bilaterally.  No sensory loss.   Labs on Admission:  Basic Metabolic Panel:  Recent Labs Lab 02/12/13 0256  NA 145  K 3.8  CL 119*  GLUCOSE 92  BUN 27*  CREATININE 2.20*   Liver Function Tests: No results found for this basename: AST, ALT, ALKPHOS,  BILITOT, PROT, ALBUMIN,  in the last 168 hours No results found for this basename: LIPASE, AMYLASE,  in the last 168 hours No results found for this basename: AMMONIA,  in the last 168 hours CBC:  Recent Labs Lab 02/12/13 0256 02/12/13 0335  WBC  --  4.8  HGB 11.2* 10.7*  HCT 33.0* 32.0*  MCV  --  92.8  PLT  --  141*   Cardiac Enzymes: No results found for this basename: CKTOTAL, CKMB, CKMBINDEX, TROPONINI,  in the last 168 hours  CBG: No results found for this basename: GLUCAP,  in the last 168 hours   Radiological Exams on Admission: Dg Chest Portable 1 View  02/12/2013   CLINICAL DATA:  Sudden onset of chest pain  EXAM: PORTABLE CHEST - 1 VIEW  COMPARISON:  Prior radiograph from 01/31/2013 as well as earlier studies.  FINDINGS: The cardiac and mediastinal silhouettes are stable in size and contour, and remain within normal limits.  Lungs are normally inflated. Diffuse prominence of the interstitial markings is stable as compared to the prior exam. No focal infiltrate, pulmonary edema, or pleural effusion is identified no pneumothorax.  Osseous structures are unchanged.  IMPRESSION: Stable appearance of chronic lung  changes. No acute cardiopulmonary process identified   Electronically Signed   By: Rise Mu M.D.   On: 02/12/2013 03:21     EKG: Independently reviewed. NSR, no acute ST-T changes.   Assessment/Plan Present on Admission:  . Atypical chest pain . Delusional disorder(297.1) . HTN (hypertension)  PLAN: Will admit her for r/out.  There is no record here of her prior MI's, but I am concerned about her CAD if that was indeed correct.  Will obtain an ECHO, cycle her troponin, and she likely will need at least a nuclear stress test.  Will continue her ASA, add lopressor, and start her on a statin while obtaining fasting lipids.  She is stable, pain free, and will be admitted to telemetry under [facility] service.  Thank you for asking me to participate in her care.     Other plans as per orders.  Code Status: FULL Unk Lightning, MD. [facility] Pager [phone] 7pm to 7am.  02/12/2013, 4:43 AM

## 2013-02-13 ENCOUNTER — Inpatient Hospital Stay (HOSPITAL_COMMUNITY): Payer: Medicare Other

## 2013-02-13 DIAGNOSIS — R109 Unspecified abdominal pain: Secondary | ICD-10-CM

## 2013-02-13 MED ORDER — POLYETHYLENE GLYCOL 3350 17 G PO PACK
17.0000 g | PACK | Freq: Every day | ORAL | Status: DC | PRN
  Administered 2013-02-13: 22:00:00 17 g via ORAL
  Filled 2013-02-13: qty 1

## 2013-02-13 MED ORDER — IOHEXOL 300 MG/ML  SOLN: 25.0000 mL | Freq: Once | INTRAMUSCULAR | Status: AC | PRN

## 2013-02-13 MED ORDER — HYDROMORPHONE HCL PF 1 MG/ML IJ SOLN
1.0000 mg | INTRAMUSCULAR | Status: DC | PRN
  Administered 2013-02-13 – 2013-02-15 (×3): 1 mg via INTRAVENOUS
  Filled 2013-02-13: qty 1

## 2013-02-13 MED ORDER — SENNOSIDES-DOCUSATE SODIUM 8.6-50 MG PO TABS
2.0000 | ORAL_TABLET | Freq: Every evening | ORAL | Status: DC | PRN
  Administered 2013-02-16: 01:00:00 2 via ORAL
  Filled 2013-02-13: qty 2

## 2013-02-13 NOTE — Progress Notes (Signed)
[facility] PROGRESS NOTE  Rhonda [name]:[id number] DOB: 1930/05/17 DOA: 02/12/2013 PCP: No primary provider on file.  Assessment/Plan: 1. Chest pain 1. Cardiac enzymes neg x 3 2. 2D echo without WMA 3. Stable 4. May consider outpt stress given risk factors 2. HTN 1. Stable 2. Cont current regimen 3. abd pain 1. Pt is s/p ostomy (from 3) 2. Palpable hernia on exam 3. Pt reports increasing distension and pain 4. Will obtain CT abd to r/o obstruction 4. Chronic back pain 1. Cont pain meds as tolerated 2. Will add PRN dilaudid for breakthrough pain 5. Schizoaffective disorder 1. Appears to be stable 6. DVT prophylaxis 1. Heparin subQ 7. Suspected UTI 1. On empiric rocephin 2. Afebrile  Code Status: Full Family Communication: Pt in room (indicate person spoken with, relationship, and if by phone, the number) Disposition Plan: Pending  Procedures:  2d echo 02/13/13   Antibiotics Rocephin 02/12/13>>>  HPI/Subjective: Complains of worsening abd pain and back pain  Objective: Filed Vitals:   02/12/13 2240 02/13/13 0404 02/13/13 0908 02/13/13 1421  BP: 118/56 100/42 105/54 96/36  Pulse: 61 49 50 49  Temp: 98.3 F (36.8 C) 98.3 F (36.8 C)  97.9 F (36.6 C)  TempSrc: Oral Oral  Oral  Resp: 16 12  16   Height:      Weight:      SpO2: 100% 98%  98%    Intake/Output Summary (Last 24 hours) at 02/13/13 1445 Last data filed at 02/13/13 1300  Gross per 24 hour  Intake    830 ml  Output    850 ml  Net    -20 ml   Filed Weights   02/12/13 0456  Weight: 48.716 kg (107 lb 6.4 oz)    Exam:   General:  Awake, in nad  Cardiovascular: regular, s1, s2  Respiratory: normal resp effort, no wheezing  Abdomen: soft, palpable abd hernia on both L and R sides of ostomy bag  Musculoskeletal: perfused, no clubbing   Data Reviewed: Basic Metabolic Panel:  Recent Labs Lab 02/12/13 0256 02/12/13 0632  NA 145  --   K 3.8  --   CL 119*  --    GLUCOSE 92  --   BUN 27*  --   CREATININE 2.20* 1.77*   Liver Function Tests: No results found for this basename: AST, ALT, ALKPHOS, BILITOT, PROT, ALBUMIN,  in the last 168 hours No results found for this basename: LIPASE, AMYLASE,  in the last 168 hours No results found for this basename: AMMONIA,  in the last 168 hours CBC:  Recent Labs Lab 02/12/13 0256 02/12/13 0335 02/12/13 0632  WBC  --  4.8 2.8*  HGB 11.2* 10.7* 8.9*  HCT 33.0* 32.0* 27.2*  MCV  --  92.8 93.5  PLT  --  141* 103*   Cardiac Enzymes:  Recent Labs Lab 02/12/13 8119 02/12/13 1227 02/12/13 1915  TROPONINI <0.30 <0.30 <0.30   BNP (last 3 results) No results found for this basename: PROBNP,  in the last 8760 hours CBG: No results found for this basename: GLUCAP,  in the last 168 hours  Recent Results (from the past 240 hour(s))  URINE CULTURE     Status: None   Collection Time    02/12/13 10:53 AM      Result Value Range Status   Specimen Description URINE, CATHETERIZED   Final   Special Requests NONE   Final   Culture  Setup Time     Final  Value: 02/12/2013 14:37     Performed at [facility]   Culture     Final   Value: >=100,000 COLONIES/mL ESCHERICHIA COLI     Performed at [facility]   Report Status PENDING   Incomplete     Studies: Dg Chest Portable 1 View  02/12/2013   CLINICAL DATA:  Sudden onset of chest pain  EXAM: PORTABLE CHEST - 1 VIEW  COMPARISON:  Prior radiograph from 01/31/2013 as well as earlier studies.  FINDINGS: The cardiac and mediastinal silhouettes are stable in size and contour, and remain within normal limits.  Lungs are normally inflated. Diffuse prominence of the interstitial markings is stable as compared to the prior exam. No focal infiltrate, pulmonary edema, or pleural effusion is identified no pneumothorax.  Osseous structures are unchanged.  IMPRESSION: Stable appearance of chronic lung changes. No acute cardiopulmonary process identified    Electronically Signed   By: Rise Mu M.D.   On: 02/12/2013 03:21    Scheduled Meds: . aspirin  325 mg Oral Daily  . atorvastatin  10 mg Oral q1800  . cefTRIAXone (ROCEPHIN)  IV  1 g Intravenous Q24H  . docusate sodium  100 mg Oral BID  . heparin  5,000 Units Subcutaneous Q8H  . metoprolol tartrate  25 mg Oral BID  . morphine  15 mg Oral Q8H  . sodium chloride  3 mL Intravenous Q12H  . sodium chloride  3 mL Intravenous Q12H   Continuous Infusions:   Principal Problem:   Atypical chest pain Active Problems:   Delusional disorder(297.1)   HTN (hypertension)   H/O myocardial infarction, greater than 8 weeks  Time spent:  CHIU, STEPHEN K  [facility] Pager [phone]. If 7PM-7AM, please contact night-coverage at [email or web address], password [facility] 02/13/2013, 2:45 PM  LOS: 1 day

## 2013-02-14 ENCOUNTER — Encounter (HOSPITAL_COMMUNITY): Payer: Self-pay | Admitting: *Deleted

## 2013-02-14 DIAGNOSIS — K5289 Other specified noninfective gastroenteritis and colitis: Principal | ICD-10-CM

## 2013-02-14 DIAGNOSIS — K529 Noninfective gastroenteritis and colitis, unspecified: Secondary | ICD-10-CM

## 2013-02-14 LAB — CBC
HCT: 28.4 % — ABNORMAL LOW (ref 36.0–46.0)
Hemoglobin: 8.8 g/dL — ABNORMAL LOW (ref 12.0–15.0)
MCH: 29.9 pg (ref 26.0–34.0)
MCHC: 31 g/dL (ref 30.0–36.0)
MCV: 96.6 fL (ref 78.0–100.0)
RBC: 2.94 MIL/uL — ABNORMAL LOW (ref 3.87–5.11)
RDW: 15.3 % (ref 11.5–15.5)

## 2013-02-14 LAB — TROPONIN I: Troponin I: <0.3 ng/mL (ref <0.30)

## 2013-02-14 LAB — COMPREHENSIVE METABOLIC PANEL
Albumin: 3 g/dL — ABNORMAL LOW (ref 3.5–5.2)
BUN: 23 mg/dL (ref 6–23)
CO2: 17 mEq/L — ABNORMAL LOW (ref 19–32)
Creatinine, Ser: 2.46 mg/dL — ABNORMAL HIGH (ref 0.50–1.10)
Total Protein: 5.4 g/dL — ABNORMAL LOW (ref 6.0–8.3)

## 2013-02-14 LAB — URINE CULTURE: Culture: >=100000

## 2013-02-14 MED ORDER — METRONIDAZOLE IN NACL 5-0.79 MG/ML-% IV SOLN
500.0000 mg | Freq: Three times a day (TID) | INTRAVENOUS | Status: DC
  Administered 2013-02-14 – 2013-02-15 (×4): 500 mg via INTRAVENOUS
  Filled 2013-02-14 (×5): qty 100

## 2013-02-14 MED ORDER — GI COCKTAIL ~~LOC~~
30.0000 mL | Freq: Two times a day (BID) | ORAL | Status: DC | PRN
  Administered 2013-02-14 – 2013-02-15 (×2): 30 mL via ORAL
  Filled 2013-02-14 (×3): qty 30

## 2013-02-14 MED ORDER — METOPROLOL TARTRATE 12.5 MG HALF TABLET
12.5000 mg | ORAL_TABLET | Freq: Two times a day (BID) | ORAL | Status: DC
  Administered 2013-02-14: 22:00:00 12.5 mg via ORAL
  Filled 2013-02-14 (×3): qty 1

## 2013-02-14 NOTE — Progress Notes (Signed)
[facility] PROGRESS NOTE  Rhonda [name] ZOX:[id number] DOB: [birth year] DOA: 02/12/2013 PCP: No primary provider on file.  Assessment/Plan: 1. Chest pain 1. Cardiac enzymes neg x 3 2. 2D echo without WMA 3. Stable 4. May consider outpt stress given risk factors 2. HTN 1. Stable 2. Cont current regimen 3. abd pain 1. Pt is s/p ostomy (from 38) 2. Palpable hernia on exam 3. Pt reports increasing distension and pain 4. CT abd done (see below) 4. Transverse colitis 1. Noted on 11/12 CT abd/pelvis 2. Afebrile 3. No leukocytosis 4. Started empiric flagyl 5. Supportive care 6. Question if presenting sx were actually related to colitis 5. Chronic back pain 1. Cont pain meds as tolerated 2. Added PRN dilaudid for breakthrough pain 6. Schizoaffective disorder 1. Appears to be stable 7. DVT prophylaxis 1. Heparin subQ 8. Suspected UTI 1. On empiric rocephin 2. Afebrile  Code Status: Full Family Communication: Pt in room (indicate person spoken with, relationship, and if by phone, the number) Disposition Plan: Pending  Procedures:  2d echo 02/13/13   Antibiotics Rocephin 02/12/13>>>  HPI/Subjective: Complains of worsening abd pain and back pain  Objective: Filed Vitals:   02/13/13 1421 02/13/13 2132 02/13/13 2228 02/14/13 0609  BP: 96/36 90/41 94/50  100/45  Pulse: 49 57 70 57  Temp: 97.9 F (36.6 C) 98.5 F (36.9 C)  98.9 F (37.2 C)  TempSrc: Oral Oral  Oral  Resp: 16 16  12   Height:      Weight:      SpO2: 98% 97%  96%    Intake/Output Summary (Last 24 hours) at 02/14/13 0936 Last data filed at 02/14/13 0700  Gross per 24 hour  Intake    650 ml  Output    800 ml  Net   -150 ml   Filed Weights   02/12/13 0456  Weight: 48.716 kg (107 lb 6.4 oz)    Exam:   General:  Awake, in nad  Cardiovascular: regular, s1, s2  Respiratory: normal resp effort, no wheezing  Abdomen: soft, palpable abd hernia on both L and R sides of ostomy  bag  Musculoskeletal: perfused, no clubbing   Data Reviewed: Basic Metabolic Panel:  Recent Labs Lab 02/12/13 0256 02/12/13 0632  NA 145  --   K 3.8  --   CL 119*  --   GLUCOSE 92  --   BUN 27*  --   CREATININE 2.20* 1.77*   Liver Function Tests: No results found for this basename: AST, ALT, ALKPHOS, BILITOT, PROT, ALBUMIN,  in the last 168 hours No results found for this basename: LIPASE, AMYLASE,  in the last 168 hours No results found for this basename: AMMONIA,  in the last 168 hours CBC:  Recent Labs Lab 02/12/13 0256 02/12/13 0335 02/12/13 0632  WBC  --  4.8 2.8*  HGB 11.2* 10.7* 8.9*  HCT 33.0* 32.0* 27.2*  MCV  --  92.8 93.5  PLT  --  141* 103*   Cardiac Enzymes:  Recent Labs Lab 02/12/13 0632 02/12/13 1227 02/12/13 1915  TROPONINI <0.30 <0.30 <0.30   BNP (last 3 results) No results found for this basename: PROBNP,  in the last 8760 hours CBG: No results found for this basename: GLUCAP,  in the last 168 hours  Recent Results (from the past 240 hour(s))  URINE CULTURE     Status: None   Collection Time    02/12/13 10:53 AM      Result Value Range Status  Specimen Description URINE, CATHETERIZED   Final   Special Requests NONE   Final   Culture  Setup Time     Final   Value: 02/12/2013 14:37     Performed at [facility]   Culture     Final   Value: >=100,000 COLONIES/mL ESCHERICHIA COLI     Performed at [facility]   Report Status PENDING   Incomplete     Studies: Ct Abdomen Pelvis Wo Contrast  02/14/2013   CLINICAL DATA:  Abdominal pain.  Ostomy.  EXAM: CT ABDOMEN AND PELVIS WITHOUT CONTRAST  TECHNIQUE: Multidetector CT imaging of the abdomen and pelvis was performed following the standard protocol without intravenous contrast.  COMPARISON:  Radiographs dated 01/31/2013 and CT dated 09/19/2012.  FINDINGS: Stable small left kidney and normal-sized right kidney. Bilateral renal cysts and multiple right renal calculi are again  demonstrated. The largest renal calculus on the right measures 4 mm in maximum diameter. No ureteral calculi are seen. No significant collecting system or ureteral dilatation is seen on either side at this time. Again noted is surgical absence of the urinary bladder with an ileal conduit. There are multiple herniated loops of bowel and abdominal fat within the previously demonstrated parastomal hernia. This includes colon and small bowel.  Interval diffuse low density wall thickening involving the right and transverse colon to the level of the hernia. The colon does not appear obstructed within the hernia. There is a smaller hernia on the right containing a portion of the proximal transverse colon at the level of the larger hernia on the left.  Multiple sigmoid colon diverticula without evidence of diverticulitis. No enlarged lymph nodes. Cholecystectomy clips. Multiple surgical clips in the pelvis and lower abdomen. Unremarkable liver, spleen, pancreas and adrenal glands. Mild right lower lobe atelectasis and minimal left lower lobe atelectasis. The previously demonstrated 8 mm nodule in the left lower lobe measures 6 mm in maximum diameter today on image 10. There is also a 5 mm nodule more peripherally in the left lower lobe on the same image (image number 10). There are multiple additional smaller peripheral nodules in the left lower lobe that are better seen or new today.  Multiple metallic densities suggesting surgical clips are again demonstrated in the upper sacral spinal canal. Minimal right pleural effusion. Minimal right lower lobe atelectasis. Diffuse osteopenia. Lumbar spine degenerative changes. Right diaphragmatic eventration.  IMPRESSION: 1. Interval right an transverse colitis. This could be ischemic, infectious or inflammatory in nature. 2. Small bowel and colon with in a large left peristomal hernia without obstruction. 3. Smaller ventral hernia on the right containing a small amount of proximal  transverse colon. 4. Stable left renal atrophy. 5. Sigmoid diverticulosis. 6. Minimal right pleural effusion. 7. Minimal right lower lobe atelectasis. 8. Nonobstructing right renal calculated. 9. Multiple small and tiny nodules in the left lower lobe with mild progression.   Electronically Signed   By: Gordan Payment M.D.   On: 02/14/2013 02:27    Scheduled Meds: . aspirin  325 mg Oral Daily  . atorvastatin  10 mg Oral q1800  . cefTRIAXone (ROCEPHIN)  IV  1 g Intravenous Q24H  . docusate sodium  100 mg Oral BID  . heparin  5,000 Units Subcutaneous Q8H  . metoprolol tartrate  25 mg Oral BID  . metronidazole  500 mg Intravenous Q8H  . morphine  15 mg Oral Q8H  . sodium chloride  3 mL Intravenous Q12H  . sodium chloride  3  mL Intravenous Q12H   Continuous Infusions:   Principal Problem:   Atypical chest pain Active Problems:   Delusional disorder(297.1)   HTN (hypertension)   H/O myocardial infarction, greater than 8 weeks  Time spent:  CHIU, STEPHEN K  [facility] Pager [phone]. If 7PM-7AM, please contact night-coverage at [email or web address], password [facility] 02/14/2013, 9:36 AM  LOS: 2 days

## 2013-02-14 NOTE — Progress Notes (Signed)
Patient laying in bed, resting upon entering room with son at bedside. Patient complaining of chest pain.  Patient states "its the same pain I had when I first got here."  Vital signs are blood pressure 96/39, pulse 51, respirations 18, and oxygen saturation is 100% on room air.  Dr. Red Christians at bedside and made aware of vital signs.  New orders received.  Twelve  lead EKG completed as ordered.  Patient asleep while EKG being performed. Will continue to monitor patient.

## 2013-02-15 ENCOUNTER — Inpatient Hospital Stay (HOSPITAL_COMMUNITY): Payer: Medicare Other

## 2013-02-15 ENCOUNTER — Encounter (HOSPITAL_COMMUNITY): Payer: Self-pay | Admitting: Physician Assistant

## 2013-02-15 DIAGNOSIS — I959 Hypotension, unspecified: Secondary | ICD-10-CM

## 2013-02-15 DIAGNOSIS — A498 Other bacterial infections of unspecified site: Secondary | ICD-10-CM

## 2013-02-15 DIAGNOSIS — R079 Chest pain, unspecified: Secondary | ICD-10-CM

## 2013-02-15 DIAGNOSIS — D61818 Other pancytopenia: Secondary | ICD-10-CM | POA: Diagnosis present

## 2013-02-15 DIAGNOSIS — F209 Schizophrenia, unspecified: Secondary | ICD-10-CM | POA: Diagnosis present

## 2013-02-15 DIAGNOSIS — N184 Chronic kidney disease, stage 4 (severe): Secondary | ICD-10-CM | POA: Diagnosis present

## 2013-02-15 DIAGNOSIS — R9389 Abnormal findings on diagnostic imaging of other specified body structures: Secondary | ICD-10-CM

## 2013-02-15 DIAGNOSIS — N390 Urinary tract infection, site not specified: Secondary | ICD-10-CM

## 2013-02-15 DIAGNOSIS — R911 Solitary pulmonary nodule: Secondary | ICD-10-CM | POA: Diagnosis present

## 2013-02-15 LAB — CBC
HCT: 29.1 % — ABNORMAL LOW (ref 36.0–46.0)
Hemoglobin: 9 g/dL — ABNORMAL LOW (ref 12.0–15.0)
RDW: 15.1 % (ref 11.5–15.5)
WBC: 3.3 10*3/uL — ABNORMAL LOW (ref 4.0–10.5)

## 2013-02-15 LAB — D-DIMER, QUANTITATIVE: D-Dimer, Quant: 0.7 ug/mL-FEU — ABNORMAL HIGH (ref 0.00–0.48)

## 2013-02-15 LAB — PROTIME-INR: Prothrombin Time: 12.6 seconds (ref 11.6–15.2)

## 2013-02-15 MED ORDER — METRONIDAZOLE 500 MG PO TABS
500.0000 mg | ORAL_TABLET | Freq: Three times a day (TID) | ORAL | Status: DC
  Administered 2013-02-15 – 2013-02-16 (×3): 500 mg via ORAL
  Filled 2013-02-15 (×6): qty 1

## 2013-02-15 MED ORDER — PANTOPRAZOLE SODIUM 40 MG PO TBEC
40.0000 mg | DELAYED_RELEASE_TABLET | Freq: Every day | ORAL | Status: DC
  Administered 2013-02-15 – 2013-02-16 (×2): 40 mg via ORAL
  Filled 2013-02-15 (×3): qty 1

## 2013-02-15 MED ORDER — TECHNETIUM TC 99M DIETHYLENETRIAME-PENTAACETIC ACID: 37.5000 | Freq: Once | INTRAVENOUS | Status: AC | PRN

## 2013-02-15 MED ORDER — METRONIDAZOLE 500 MG PO TABS: 500.0000 mg | ORAL_TABLET | Freq: Three times a day (TID) | ORAL | Status: DC

## 2013-02-15 MED ORDER — CIPROFLOXACIN HCL 500 MG PO TABS
500.0000 mg | ORAL_TABLET | Freq: Every day | ORAL | Status: DC
  Administered 2013-02-15: 11:00:00 500 mg via ORAL
  Filled 2013-02-15 (×2): qty 1

## 2013-02-15 MED ORDER — CIPROFLOXACIN HCL 500 MG PO TABS
500.0000 mg | ORAL_TABLET | Freq: Two times a day (BID) | ORAL | Status: DC
  Filled 2013-02-15 (×3): qty 1

## 2013-02-15 MED ORDER — PANTOPRAZOLE SODIUM 40 MG PO TBEC: 40.0000 mg | DELAYED_RELEASE_TABLET | Freq: Every day | ORAL | Status: DC

## 2013-02-15 MED ORDER — CIPROFLOXACIN HCL 500 MG PO TABS: 500.0000 mg | ORAL_TABLET | Freq: Two times a day (BID) | ORAL | Status: DC

## 2013-02-15 MED ORDER — ENOXAPARIN SODIUM 60 MG/0.6ML ~~LOC~~ SOLN
50.0000 mg | SUBCUTANEOUS | Status: DC
  Administered 2013-02-15: 21:00:00 50 mg via SUBCUTANEOUS
  Filled 2013-02-15 (×2): qty 0.6

## 2013-02-15 NOTE — Discharge Summary (Signed)
Physician Discharge Summary  PARISHA BEAULAC ZOX:[id number] DOB: 15-Sep-1930 DOA: 02/12/2013  PCP: No primary provider on file.  Admit date: 02/12/2013 Discharge date: 02/16/2013  Time spent: 35 minutes  Recommendations for Outpatient Follow-up:  1. Follow up with PCP in 1-2 weeks  Discharge Diagnoses:  Principal Problem:   Noninfectious gastroenteritis and colitis Active Problems:   Delusional disorder(297.1)   Atypical chest pain   HTN (hypertension)   H/O myocardial infarction, greater than 8 weeks   Pulmonary nodule   Schizophrenia   CKD (chronic kidney disease) stage 4, GFR 15-29 ml/min   Pancytopenia   Discharge Condition: Stable  Diet recommendation: Heart Healthy  Filed Weights   02/12/13 0456  Weight: 48.716 kg (107 lb 6.4 oz)    History of present illness:  Rhonda [name] is an 79 y.o. female with hx of prior MI (4 MI's according to her, cardiologist in [facility]), hx of schizophrenia and delusional disorder, anemia, HTN, chronic back pain on long acting morphine, presents to the ER with substernal chest pain lasting one hour while trying to go to sleep. She denied diaphoresis, nausea, or vomiting, but admitted to mild shortness of breath. There has been no fever, chills, coughs, abdominal pain, black or bloody stool. Evalatuion in the ER included an EKG with NSR and no acute ST-T changes. Her initial troponin was negative. CXR was clear, but her Cr was 2.2 (not new: 10/14= 2.1, 7/14=1.85). She is painfree upon arrival to the ER. In the past, she had refused a heart catherization. Hospitalist was asked to admit her for chest pain, r/out.  Hospital Course:  1. Chest pain  1. Cardiac enzymes neg x 3 2. 2D echo without WMA 3. Stable 4. Originally considering outpatient stress, however, pt again noted to have recurrent chest pain w/ sob 5. Repeat troponinwas neg x1 6. Started protonix and GI cocktail prn 7. D-dimer borderline elevated, VQ results were  inconclusive 8. Pt would benefit from additional tests, but pt refuses. She understands the risks of potentially having a PE, including death and still does not want to pursue additional work up. 9. Too much risk to fully anticoagulate given chronic thrombycytopenia 10. Cardiology recs appreciated 11. Decreased asa to 81mg  and beta blocker d/c'd secondary to low bp and bradycardia 12. GI to follow up as an outpatient 2. HTN  1. Stable 2. Cont current regimen 3. abd pain  1. Pt is s/p ostomy (from 47) 2. Palpable hernia on exam 3. Pt reports increasing distension and pain 4. CT abd done (see below) 4. Transverse colitis  1. Noted on 11/12 CT abd/pelvis 2. Afebrile 3. No leukocytosis 4. Started empiric flagyl 5. Supportive care 6. Question if presenting sx were actually related to colitis 5. Chronic back pain  1. Cont pain meds as tolerated 2. Added PRN dilaudid for breakthrough pain 6. Schizoaffective disorder  1. Appears to be stable 7. DVT prophylaxis  1. Heparin subQ 8. Suspected UTI  1. >100,000 multidrug sensitive ecoli 2. Was on empiric rocephin 3. Afebrile  Discharge Exam: Filed Vitals:   02/15/13 1011 02/15/13 1347 02/15/13 2225 02/16/13 0535  BP:  94/39 108/55 111/47  Pulse: 55 57 61 64  Temp:  98.9 F (37.2 C) 98.4 F (36.9 C) 97.5 F (36.4 C)  TempSrc:  Oral Oral Oral  Resp:  16 16 16   Height:      Weight:      SpO2:  94% 94% 97%    General: Awake, in nad Cardiovascular:  regular, s1, s2 Respiratory: normal resp effort, no wheezing  Discharge Instructions     Medication List    STOP taking these medications       aspirin 325 MG tablet  Replaced by:  aspirin 81 MG EC tablet     cephALEXin 500 MG capsule  Commonly known as:  KEFLEX      TAKE these medications       aspirin 81 MG EC tablet  Take 1 tablet (81 mg total) by mouth daily.     atorvastatin 10 MG tablet  Commonly known as:  LIPITOR  Take 1 tablet (10 mg total) by mouth daily  at 6 PM.     DSS 100 MG Caps  Take 100 mg by mouth 2 (two) times daily.     HYDROcodone-acetaminophen 5-325 MG per tablet  Commonly known as:  NORCO/VICODIN  Take 1 tablet by mouth every 6 (six) hours as needed for pain.     metroNIDAZOLE 500 MG tablet  Commonly known as:  FLAGYL  Take 1 tablet (500 mg total) by mouth every 8 (eight) hours.     morphine 15 MG 12 hr tablet  Commonly known as:  MS CONTIN  Take 1 tablet (15 mg total) by mouth every 8 (eight) hours.     pantoprazole 40 MG tablet  Commonly known as:  PROTONIX  Take 1 tablet (40 mg total) by mouth daily.       Allergies  Allergen Reactions  . Macrodantin [Nitrofurantoin Macrocrystal] Other (See Comments)    "Makes her fall"  . Septra [Sulfamethoxazole-Trimethoprim] Other (See Comments)    "Makes her fall"  . Soma [Carisoprodol] Other (See Comments)    "Makes her fall"   Follow-up Information   Follow up with follow up with your PCP. (1-2 weeks)       Follow up with Charlott Rakes C., MD. (you will be called to schedule an appointment)    Specialty:  Gastroenterology   Contact information:   [address], [address] [place] [address] [phone]        The results of significant diagnostics from this hospitalization (including imaging, microbiology, ancillary and laboratory) are listed below for reference.    Significant Diagnostic Studies: Ct Abdomen Pelvis Wo Contrast  02/14/2013   CLINICAL DATA:  Abdominal pain.  Ostomy.  EXAM: CT ABDOMEN AND PELVIS WITHOUT CONTRAST  TECHNIQUE: Multidetector CT imaging of the abdomen and pelvis was performed following the standard protocol without intravenous contrast.  COMPARISON:  Radiographs dated 01/31/2013 and CT dated 09/19/2012.  FINDINGS: Stable small left kidney and normal-sized right kidney. Bilateral renal cysts and multiple right renal calculi are again demonstrated. The largest renal calculus on the right measures 4 mm in maximum diameter. No  ureteral calculi are seen. No significant collecting system or ureteral dilatation is seen on either side at this time. Again noted is surgical absence of the urinary bladder with an ileal conduit. There are multiple herniated loops of bowel and abdominal fat within the previously demonstrated parastomal hernia. This includes colon and small bowel.  Interval diffuse low density wall thickening involving the right and transverse colon to the level of the hernia. The colon does not appear obstructed within the hernia. There is a smaller hernia on the right containing a portion of the proximal transverse colon at the level of the larger hernia on the left.  Multiple sigmoid colon diverticula without evidence of diverticulitis. No enlarged lymph nodes. Cholecystectomy clips. Multiple surgical clips in the  pelvis and lower abdomen. Unremarkable liver, spleen, pancreas and adrenal glands. Mild right lower lobe atelectasis and minimal left lower lobe atelectasis. The previously demonstrated 8 mm nodule in the left lower lobe measures 6 mm in maximum diameter today on image 10. There is also a 5 mm nodule more peripherally in the left lower lobe on the same image (image number 10). There are multiple additional smaller peripheral nodules in the left lower lobe that are better seen or new today.  Multiple metallic densities suggesting surgical clips are again demonstrated in the upper sacral spinal canal. Minimal right pleural effusion. Minimal right lower lobe atelectasis. Diffuse osteopenia. Lumbar spine degenerative changes. Right diaphragmatic eventration.  IMPRESSION: 1. Interval right an transverse colitis. This could be ischemic, infectious or inflammatory in nature. 2. Small bowel and colon with in a large left peristomal hernia without obstruction. 3. Smaller ventral hernia on the right containing a small amount of proximal transverse colon. 4. Stable left renal atrophy. 5. Sigmoid diverticulosis. 6. Minimal right  pleural effusion. 7. Minimal right lower lobe atelectasis. 8. Nonobstructing right renal calculated. 9. Multiple small and tiny nodules in the left lower lobe with mild progression.   Electronically Signed   By: Gordan Payment M.D.   On: 02/14/2013 02:27   Dg Chest Portable 1 View  02/12/2013   CLINICAL DATA:  Sudden onset of chest pain  EXAM: PORTABLE CHEST - 1 VIEW  COMPARISON:  Prior radiograph from 01/31/2013 as well as earlier studies.  FINDINGS: The cardiac and mediastinal silhouettes are stable in size and contour, and remain within normal limits.  Lungs are normally inflated. Diffuse prominence of the interstitial markings is stable as compared to the prior exam. No focal infiltrate, pulmonary edema, or pleural effusion is identified no pneumothorax.  Osseous structures are unchanged.  IMPRESSION: Stable appearance of chronic lung changes. No acute cardiopulmonary process identified   Electronically Signed   By: Rise Mu M.D.   On: 02/12/2013 03:21   Dg Abd Acute W/chest  01/31/2013   CLINICAL DATA:  Abdominal pain, nausea.  EXAM: ACUTE ABDOMEN SERIES (ABDOMEN 2 VIEW & CHEST 1 VIEW)  COMPARISON:  01/05/2013  FINDINGS: There is no evidence of dilated bowel loops or free intraperitoneal air. Bowel loops project lateral to the left iliac wing suggesting body wall hernia, without evidence of obstruction. Heart size and mediastinal contours are within normal limits. Both lungs are clear. Degenerative changes in the right shoulder. Surgical clips in the right upper abdomen, and pelvis  IMPRESSION: 1. Nonobstructive bowel gas pattern with probable left lower body wall hernia. 2. No free air. 3. No acute cardiopulmonary disease.   Electronically Signed   By: Oley Balm M.D.   On: 01/31/2013 18:09    Microbiology: Recent Results (from the past 240 hour(s))  URINE CULTURE     Status: None   Collection Time    02/12/13 10:53 AM      Result Value Range Status   Specimen Description URINE,  CATHETERIZED   Final   Special Requests NONE   Final   Culture  Setup Time     Final   Value: 02/12/2013 14:37     Performed at [facility]   Culture     Final   Value: >=100,000 COLONIES/mL ESCHERICHIA COLI     Performed at [facility]   Report Status 02/14/2013 FINAL   Final   Organism ID, Bacteria ESCHERICHIA COLI   Final     Labs: Basic  Metabolic Panel:  Recent Labs Lab 02/12/13 0256 02/12/13 0632 02/14/13 1616 02/16/13 0440  NA 145  --  137 135  K 3.8  --  3.7 3.5  CL 119*  --  110 109  CO2  --   --  17* 15*  GLUCOSE 92  --  92 92  BUN 27*  --  23 25*  CREATININE 2.20* 1.77* 2.46* 2.59*  CALCIUM  --   --  8.9 9.0   Liver Function Tests:  Recent Labs Lab 02/14/13 1616 02/16/13 0440  AST 11 10  ALT 7 6  ALKPHOS 48 48  BILITOT 0.2* 0.2*  PROT 5.4* 5.5*  ALBUMIN 3.0* 3.1*   No results found for this basename: LIPASE, AMYLASE,  in the last 168 hours No results found for this basename: AMMONIA,  in the last 168 hours CBC:  Recent Labs Lab 02/12/13 0335 02/12/13 0632 02/14/13 1616 02/15/13 0908 02/16/13 0440  WBC 4.8 2.8* 2.8* 3.3* 2.2*  HGB 10.7* 8.9* 8.8* 9.0* 8.5*  HCT 32.0* 27.2* 28.4* 29.1* 27.3*  MCV 92.8 93.5 96.6 96.7 96.5  PLT 141* 103* 91* 79* 81*   Cardiac Enzymes:  Recent Labs Lab 02/12/13 0632 02/12/13 1227 02/12/13 1915 02/14/13 1616  TROPONINI <0.30 <0.30 <0.30 <0.30   BNP: BNP (last 3 results) No results found for this basename: PROBNP,  in the last 8760 hours CBG: No results found for this basename: GLUCAP,  in the last 168 hours   Signed:  Beth Goodlin K  [facility] 02/16/2013, 12:49 PM \

## 2013-02-15 NOTE — Progress Notes (Signed)
[facility] PROGRESS NOTE  Rhonda [name] ZOX:[id number] DOB: [birth year] DOA: 02/12/2013 PCP: No primary provider on file.  Assessment/Plan: 1. Chest pain 1. Cardiac enzymes neg x 3 2. 2D echo without WMA 3. Stable 4. Originally considering outpatient stress, however, pt again noted to have recurrent chest pain w/ sob 5. Repeat troponin yesterday was neg x1 6. Started protonix and GI cocktail prn 7. Consider Cardiology consult given 2. HTN 1. Stable 2. Cont current regimen 3. abd pain 1. Pt is s/p ostomy (from 40) 2. Palpable hernia on exam 3. Pt reports increasing distension and pain 4. CT abd done (see below) 4. Transverse colitis 1. Noted on 11/12 CT abd/pelvis 2. Afebrile 3. No leukocytosis 4. Started empiric flagyl 5. Supportive care 6. Question if presenting sx were actually related to colitis 5. Chronic back pain 1. Cont pain meds as tolerated 2. Added PRN dilaudid for breakthrough pain 6. Schizoaffective disorder 1. Appears to be stable 7. DVT prophylaxis 1. Heparin subQ 8. Suspected UTI 1. >100,000 multidrug sensitive ecoli 2. Was on empiric rocephin 3. Afebrile 4. Will transition to PO cipro per sensitivities 5.  Code Status: Full Family Communication: Pt in room (indicate person spoken with, relationship, and if by phone, the number) Disposition Plan: Pending  Procedures:  2d echo 02/13/13   Antibiotics Rocephin 02/12/13>>>  HPI/Subjective: Complains of worsening abd pain and back pain  Objective: Filed Vitals:   02/14/13 1033 02/14/13 1343 02/14/13 2105 02/15/13 0514  BP:  96/38 102/44 112/48  Pulse: 47 50  68  Temp:  98.7 F (37.1 C) 98.1 F (36.7 C) 98.7 F (37.1 C)  TempSrc:  Oral Oral Oral  Resp:  16 18 18   Height:      Weight:      SpO2:  97% 95% 96%    Intake/Output Summary (Last 24 hours) at 02/15/13 0958 Last data filed at 02/15/13 0500  Gross per 24 hour  Intake    970 ml  Output   1000 ml  Net    -30 ml    Filed Weights   02/12/13 0456  Weight: 48.716 kg (107 lb 6.4 oz)    Exam:   General:  Awake, in nad  Cardiovascular: regular, s1, s2  Respiratory: normal resp effort, no wheezing  Abdomen: soft, palpable abd hernia on both L and R sides of ostomy bag  Musculoskeletal: perfused, no clubbing   Data Reviewed: Basic Metabolic Panel:  Recent Labs Lab 02/12/13 0256 02/12/13 0632 02/14/13 1616  NA 145  --  137  K 3.8  --  3.7  CL 119*  --  110  CO2  --   --  17*  GLUCOSE 92  --  92  BUN 27*  --  23  CREATININE 2.20* 1.77* 2.46*  CALCIUM  --   --  8.9   Liver Function Tests:  Recent Labs Lab 02/14/13 1616  AST 11  ALT 7  ALKPHOS 48  BILITOT 0.2*  PROT 5.4*  ALBUMIN 3.0*   No results found for this basename: LIPASE, AMYLASE,  in the last 168 hours No results found for this basename: AMMONIA,  in the last 168 hours CBC:  Recent Labs Lab 02/12/13 0256 02/12/13 0335 02/12/13 0632 02/14/13 1616 02/15/13 0908  WBC  --  4.8 2.8* 2.8* 3.3*  HGB 11.2* 10.7* 8.9* 8.8* 9.0*  HCT 33.0* 32.0* 27.2* 28.4* 29.1*  MCV  --  92.8 93.5 96.6 96.7  PLT  --  141* 103* 91* 79*  Cardiac Enzymes:  Recent Labs Lab 02/12/13 1610 02/12/13 1227 02/12/13 1915 02/14/13 1616  TROPONINI <0.30 <0.30 <0.30 <0.30   BNP (last 3 results) No results found for this basename: PROBNP,  in the last 8760 hours CBG: No results found for this basename: GLUCAP,  in the last 168 hours  Recent Results (from the past 240 hour(s))  URINE CULTURE     Status: None   Collection Time    02/12/13 10:53 AM      Result Value Range Status   Specimen Description URINE, CATHETERIZED   Final   Special Requests NONE   Final   Culture  Setup Time     Final   Value: 02/12/2013 14:37     Performed at [facility]   Culture     Final   Value: >=100,000 COLONIES/mL ESCHERICHIA COLI     Performed at [facility]   Report Status 02/14/2013 FINAL   Final   Organism ID, Bacteria  ESCHERICHIA COLI   Final     Studies: Ct Abdomen Pelvis Wo Contrast  02/14/2013   CLINICAL DATA:  Abdominal pain.  Ostomy.  EXAM: CT ABDOMEN AND PELVIS WITHOUT CONTRAST  TECHNIQUE: Multidetector CT imaging of the abdomen and pelvis was performed following the standard protocol without intravenous contrast.  COMPARISON:  Radiographs dated 01/31/2013 and CT dated 09/19/2012.  FINDINGS: Stable small left kidney and normal-sized right kidney. Bilateral renal cysts and multiple right renal calculi are again demonstrated. The largest renal calculus on the right measures 4 mm in maximum diameter. No ureteral calculi are seen. No significant collecting system or ureteral dilatation is seen on either side at this time. Again noted is surgical absence of the urinary bladder with an ileal conduit. There are multiple herniated loops of bowel and abdominal fat within the previously demonstrated parastomal hernia. This includes colon and small bowel.  Interval diffuse low density wall thickening involving the right and transverse colon to the level of the hernia. The colon does not appear obstructed within the hernia. There is a smaller hernia on the right containing a portion of the proximal transverse colon at the level of the larger hernia on the left.  Multiple sigmoid colon diverticula without evidence of diverticulitis. No enlarged lymph nodes. Cholecystectomy clips. Multiple surgical clips in the pelvis and lower abdomen. Unremarkable liver, spleen, pancreas and adrenal glands. Mild right lower lobe atelectasis and minimal left lower lobe atelectasis. The previously demonstrated 8 mm nodule in the left lower lobe measures 6 mm in maximum diameter today on image 10. There is also a 5 mm nodule more peripherally in the left lower lobe on the same image (image number 10). There are multiple additional smaller peripheral nodules in the left lower lobe that are better seen or new today.  Multiple metallic densities  suggesting surgical clips are again demonstrated in the upper sacral spinal canal. Minimal right pleural effusion. Minimal right lower lobe atelectasis. Diffuse osteopenia. Lumbar spine degenerative changes. Right diaphragmatic eventration.  IMPRESSION: 1. Interval right an transverse colitis. This could be ischemic, infectious or inflammatory in nature. 2. Small bowel and colon with in a large left peristomal hernia without obstruction. 3. Smaller ventral hernia on the right containing a small amount of proximal transverse colon. 4. Stable left renal atrophy. 5. Sigmoid diverticulosis. 6. Minimal right pleural effusion. 7. Minimal right lower lobe atelectasis. 8. Nonobstructing right renal calculated. 9. Multiple small and tiny nodules in the left lower lobe with mild progression.  Electronically Signed   By: Gordan Payment M.D.   On: 02/14/2013 02:27    Scheduled Meds: . aspirin  325 mg Oral Daily  . atorvastatin  10 mg Oral q1800  . ciprofloxacin  500 mg Oral BID  . docusate sodium  100 mg Oral BID  . heparin  5,000 Units Subcutaneous Q8H  . metoprolol tartrate  12.5 mg Oral BID  . metroNIDAZOLE  500 mg Oral Q8H  . morphine  15 mg Oral Q8H  . pantoprazole  40 mg Oral Daily  . sodium chloride  3 mL Intravenous Q12H  . sodium chloride  3 mL Intravenous Q12H   Continuous Infusions:   Principal Problem:   Noninfectious gastroenteritis and colitis Active Problems:   Delusional disorder(297.1)   Atypical chest pain   HTN (hypertension)   H/O myocardial infarction, greater than 8 weeks  Time spent:  Kato Wieczorek K  [facility] Pager [phone]. If 7PM-7AM, please contact night-coverage at [email or web address], password [facility] 02/15/2013, 9:58 AM  LOS: 3 days

## 2013-02-15 NOTE — Progress Notes (Signed)
ANTICOAGULATION CONSULT NOTE - Initial Consult  Pharmacy Consult for enoxaparin  Indication: pulmonary embolus  Allergies  Allergen Reactions  . Macrodantin [Nitrofurantoin Macrocrystal] Other (See Comments)    "Makes her fall"  . Septra [Sulfamethoxazole-Trimethoprim] Other (See Comments)    "Makes her fall"  . Soma [Carisoprodol] Other (See Comments)    "Makes her fall"    Patient Measurements: Height: 5' (152.4 cm) Weight: 107 lb 6.4 oz (48.716 kg) IBW/kg (Calculated) : 45.5   Vital Signs: Temp: 98.9 F (37.2 C) (11/14 1347) Temp src: Oral (11/14 1347) BP: 94/39 mmHg (11/14 1347) Pulse Rate: 57 (11/14 1347)  Labs:  Recent Labs  02/12/13 1915 02/14/13 1616 02/15/13 0908  HGB  --  8.8* 9.0*  HCT  --  28.4* 29.1*  PLT  --  91* 79*  CREATININE  --  2.46*  --   TROPONINI <0.30 <0.30  --     Estimated Creatinine Clearance: 13.1 ml/min (by C-G formula based on Cr of 2.46).   Medical History: Past Medical History  Diagnosis Date  . Hypertension   . Anemia     a. H/o both iron def and B12 def.  . Arthritis   . Pneumonia   . Goiter   . Schizo affective schizophrenia   . Myocardial infarct     a. Hx of 4 MIs per pt with cardiologist in [place].  . Chronic back pain   . Ventral hernia   . CKD (chronic kidney disease)   . Diverticular disease   . Esophageal disorder     a. esophageal stricture and esophagitis with dilatation in 1997..  . Chronic lung disease     a. Per records - prior nocturnal hypoxia documented, PFTs 2001 - Decreased vital capacity, decreased with neg PE w/u at that time.  . Stroke     a. Pt reports she "tried to have a stroke" after last MI <1 yr ago.     Assessment: 75 yoF admitted 11/11 with substernal chest pain. CEs were negative and the patient has been ruled out for an MI. On 11/13, the patient again had CP and SOB and Pharmacy has been consulted to dose enoxaparin for treatment of suspected PE.  VQ scan to be performed 11/15  for confirmation of PE  D-dimer is sightly elevated at 0.7  Hgb is low at 9.0 (baseline appears to be 9.5-10.5, and platelets are low at 79 (baseline [phone])  No bleeding has been noted  SCr elevated at 2.46 (baseline appears to be 1.8-2.0), CrcL 13 ml/min  Weight documented as 48.7kg  Patient had been receiving heparin 5000 units SQ q8h for VTE prophylaxis, last dose at 1337 today  Patient also on aspirin 325mg  daily   Goal of Therapy:  Anti-Xa level 0.6-1.2 units/ml 4hrs after LMWH dose given Monitor platelets by anticoagulation protocol: Yes   Plan:  - enoxaparin 1mg /kg/day based on renal dysfunction= enoxaparin 50mg  SQ daily - cbc at least q72h while on enoxaparin - follow-up renal function - follow-up CBC and monitor for bleeding - follow-up plans for long-term anticoagulation  Thank you for the consult.  Tomi Bamberger, PharmD Clinical Pharmacist Pager: [phone] Pharmacy: [phone] 02/15/2013 6:47 PM

## 2013-02-15 NOTE — Progress Notes (Signed)
Pt c/o chest pain and difficulty breathing. Pt sat up in high fowlers placed on o2 vitals obtained and md called. Pt now states she feels a little better. Will continue to montor.

## 2013-02-15 NOTE — Consult Note (Signed)
Cardiology Consultation Note  Patient ID: Rhonda [name], MRN: [id number], DOB/AGE: 11/03/1930 79 y.o. Admit date: 02/12/2013   Date of Consult: 02/15/2013 Primary Physician: No primary provider on file. Primary Cardiologist: in [facility]  Chief Complaint: chest pain Reason for Consult: chest pain  HPI: Rhonda [name] is a 79 y/o F with hx of prior MI (4 MI's according to her, cardiologist in [facility]), hx of schizophrenia and delusional disorder, anemia, HTN, chronic lung disease, CKD, chronic back pain on long acting morphine, presented to the [facility] on 02/12/13 with chest pain. She states she's never had a cath or stress test and refuses to consider one. When asked why, she said she just doesn't ever want one. She is a difficult historian and son assists in some of the interview. Apparently on Monday night (02/11/13) she had an hour of substernal chest pain while trying to go to sleep. This was worse with moving around. She endorsed mild SOB, nausea, but denied diaphoresis, vomiting, or syncope. The pain improved with a deep breath. She also has had some abdominal pain. She denied having usual CP but her son reported that she occasionally takes her oral morphine for CP with relief. In there ER, CXR nonacute. Troponins neg x 4. UA with nitrite (E Coli), TSH WNL, d-dimer 0.70, Hgb 8-9 with progressive thrombocytopenia this adm (141->79). Cr variable from 1.7-2.46 (1.9-2.2 recently). 2D Echo showed severe concentric LVH with EF 80% with mid-ventricular cavitary obliteration, mild AI, dilated IVC and respirophasic diameter changes were blunted suggesting elevated CVP. I tried to ask her if she was still having pain. She was very tangential in her answer. She did report an episode last night that was "terrible" that had resolved. Finally she lowered her tone of voice and said she "feels a pressure and it's affecting [her] voice." Lopressor was started but she has baseline bradycardia and BP in the 90s-low 100's  so this was held.  Due to h/o ostomy (which family is related to bladder surgery in the 1980s) CT abd/pelvis was done for abd pain. She was found to have transverse colitis and was started on Flagyl. She also had sigmoid diverticulosis and multiple small and tiny nodules within the LLL.  Past Medical History  Diagnosis Date  . Hypertension   . Anemia     a. H/o both iron def and B12 def.  . Arthritis   . Pneumonia   . Goiter   . Schizo affective schizophrenia   . Myocardial infarct     a. Hx of 4 MIs per pt with cardiologist in [place].  . Chronic back pain   . Ventral hernia   . CKD (chronic kidney disease)   . Diverticular disease   . Esophageal disorder     a. esophageal stricture and esophagitis with dilatation in 1997..  . Chronic lung disease     a. Per records - prior nocturnal hypoxia documented, PFTs 2001 - Decreased vital capacity, decreased with neg PE w/u at that time.      Most Recent Cardiac Studies: 2D Echo 02/12/13 - Left ventricle: There was severe concentric hypertrophy. Systolic function was vigorous. The estimated ejection fraction was 80%. There is mid-ventricular cavitary obliteration. Mid-ventricular false tendon. Wall motion was normal; there were no regional wall motion abnormalities. Doppler parameters are consistent with abnormal left ventricular relaxation (grade 1 diastolic dysfunction). The E/e' ratio is>10, suggesting elevated LV filling pressure. - Aortic valve: Trileaflet; mildly calcified leaflets. Transvalvular velocity was within the normal  range. There was no stenosis. Mild regurgitation. - Left atrium: The atrium was mildly dilated (33.4 ml/m2). - Tricuspid valve: Peak RV-RA gradient: 23mm Hg (S). - Inferior vena cava: The vessel was dilated; the respirophasic diameter changes were blunted (< 50%); findings are consistent with elevated central venous pressure. - Pericardium, extracardiac: There was no pericardial Effusion.  Per  2001 DC summ: Adenosine Cardiolite (September 06, 1999): Normal, ejection fraction 86%. No records available from [facility].   Surgical History:  Past Surgical History  Procedure Laterality Date  . Abdominal hysterectomy    . Back surgery    . Hernia repair    . Cholecystectomy    . Revision urostomy cutaneous       Home Meds: Prior to Admission medications   Medication Sig Start Date End Date Taking? Authorizing Provider  aspirin 325 MG tablet Take 325 mg by mouth daily.   Yes Historical Provider, MD  docusate sodium 100 MG CAPS Take 100 mg by mouth 2 (two) times daily. 01/12/13  Yes Rodolph Bong, MD  HYDROcodone-acetaminophen (NORCO/VICODIN) 5-325 MG per tablet Take 1 tablet by mouth every 6 (six) hours as needed for pain. 01/31/13  Yes Jamesetta Orleans Lawyer, PA-C  morphine (MS CONTIN) 15 MG 12 hr tablet Take 1 tablet (15 mg total) by mouth every 8 (eight) hours. 01/12/13  Yes Rodolph Bong, MD  ciprofloxacin (CIPRO) 500 MG tablet Take 1 tablet (500 mg total) by mouth 2 (two) times daily. 02/15/13   Jerald Kief, MD  metroNIDAZOLE (FLAGYL) 500 MG tablet Take 1 tablet (500 mg total) by mouth every 8 (eight) hours. 02/15/13   Jerald Kief, MD  pantoprazole (PROTONIX) 40 MG tablet Take 1 tablet (40 mg total) by mouth daily. 02/15/13   Jerald Kief, MD    Inpatient Medications:  . aspirin  325 mg Oral Daily  . atorvastatin  10 mg Oral q1800  . ciprofloxacin  500 mg Oral Daily  . docusate sodium  100 mg Oral BID  . heparin  5,000 Units Subcutaneous Q8H  . metoprolol tartrate  12.5 mg Oral BID  . metroNIDAZOLE  500 mg Oral Q8H  . morphine  15 mg Oral Q8H  . pantoprazole  40 mg Oral Daily  . sodium chloride  3 mL Intravenous Q12H  . sodium chloride  3 mL Intravenous Q12H      Allergies:  Allergies  Allergen Reactions  . Macrodantin [Nitrofurantoin Macrocrystal] Other (See Comments)    "Makes her fall"  . Septra [Sulfamethoxazole-Trimethoprim] Other (See Comments)     "Makes her fall"  . Soma [Carisoprodol] Other (See Comments)    "Makes her fall"    History   Social History  . Marital Status: Married    Spouse Name: N/A    Number of Children: N/A  . Years of Education: N/A   Occupational History  . Not on file.   Social History Main Topics  . Smoking status: Never Smoker   . Smokeless tobacco: Never Used  . Alcohol Use: No  . Drug Use: No  . Sexual Activity: Yes    Birth Control/ Protection: None   Other Topics Concern  . Not on file   Social History Narrative  . No narrative on file     Family History  Problem Relation Age of Onset  . Alzheimer's disease Mother   . CAD Father      Review of Systems: General: negative for chills, fever Cardiovascular: see above Dermatological: negative  for rash Respiratory: negative for cough or wheezing Urologic: negative for hematuria Abdominal: negative for vomiting, diarrhea, bright red blood per rectum, melena, or hematemesis Neurologic: negative for visual changes, syncope, or dizziness All other systems reviewed and are otherwise negative except as noted above.  Labs:  Recent Labs  02/12/13 1915 02/14/13 1616  TROPONINI <0.30 <0.30   Lab Results  Component Value Date   WBC 3.3* 02/15/2013   HGB 9.0* 02/15/2013   HCT 29.1* 02/15/2013   MCV 96.7 02/15/2013   PLT 79* 02/15/2013    Recent Labs Lab 02/14/13 1616  NA 137  K 3.7  CL 110  CO2 17*  BUN 23  CREATININE 2.46*  CALCIUM 8.9  PROT 5.4*  BILITOT 0.2*  ALKPHOS 48  ALT 7  AST 11  GLUCOSE 92   Lab Results  Component Value Date   CHOL 104 02/12/2013   HDL 38* 02/12/2013   LDLCALC 54 02/12/2013   TRIG 60 02/12/2013   Lab Results  Component Value Date   DDIMER 0.70* 02/15/2013    Radiology/Studies:  Ct Abdomen Pelvis Wo Contrast  02/14/2013   CLINICAL DATA:  Abdominal pain.  Ostomy.  EXAM: CT ABDOMEN AND PELVIS WITHOUT CONTRAST  TECHNIQUE: Multidetector CT imaging of the abdomen and pelvis was  performed following the standard protocol without intravenous contrast.  COMPARISON:  Radiographs dated 01/31/2013 and CT dated 09/19/2012.  FINDINGS: Stable small left kidney and normal-sized right kidney. Bilateral renal cysts and multiple right renal calculi are again demonstrated. The largest renal calculus on the right measures 4 mm in maximum diameter. No ureteral calculi are seen. No significant collecting system or ureteral dilatation is seen on either side at this time. Again noted is surgical absence of the urinary bladder with an ileal conduit. There are multiple herniated loops of bowel and abdominal fat within the previously demonstrated parastomal hernia. This includes colon and small bowel.  Interval diffuse low density wall thickening involving the right and transverse colon to the level of the hernia. The colon does not appear obstructed within the hernia. There is a smaller hernia on the right containing a portion of the proximal transverse colon at the level of the larger hernia on the left.  Multiple sigmoid colon diverticula without evidence of diverticulitis. No enlarged lymph nodes. Cholecystectomy clips. Multiple surgical clips in the pelvis and lower abdomen. Unremarkable liver, spleen, pancreas and adrenal glands. Mild right lower lobe atelectasis and minimal left lower lobe atelectasis. The previously demonstrated 8 mm nodule in the left lower lobe measures 6 mm in maximum diameter today on image 10. There is also a 5 mm nodule more peripherally in the left lower lobe on the same image (image number 10). There are multiple additional smaller peripheral nodules in the left lower lobe that are better seen or new today.  Multiple metallic densities suggesting surgical clips are again demonstrated in the upper sacral spinal canal. Minimal right pleural effusion. Minimal right lower lobe atelectasis. Diffuse osteopenia. Lumbar spine degenerative changes. Right diaphragmatic eventration.   IMPRESSION: 1. Interval right an transverse colitis. This could be ischemic, infectious or inflammatory in nature. 2. Small bowel and colon with in a large left peristomal hernia without obstruction. 3. Smaller ventral hernia on the right containing a small amount of proximal transverse colon. 4. Stable left renal atrophy. 5. Sigmoid diverticulosis. 6. Minimal right pleural effusion. 7. Minimal right lower lobe atelectasis. 8. Nonobstructing right renal calculated. 9. Multiple small and tiny nodules in the left lower  lobe with mild progression.   Electronically Signed   By: Gordan Payment M.D.   On: 02/14/2013 02:27   Dg Chest Portable 1 View  02/12/2013   CLINICAL DATA:  Sudden onset of chest pain  EXAM: PORTABLE CHEST - 1 VIEW  COMPARISON:  Prior radiograph from 01/31/2013 as well as earlier studies.  FINDINGS: The cardiac and mediastinal silhouettes are stable in size and contour, and remain within normal limits.  Lungs are normally inflated. Diffuse prominence of the interstitial markings is stable as compared to the prior exam. No focal infiltrate, pulmonary edema, or pleural effusion is identified no pneumothorax.  Osseous structures are unchanged.  IMPRESSION: Stable appearance of chronic lung changes. No acute cardiopulmonary process identified   Electronically Signed   By: Rise Mu M.D.   On: 02/12/2013 03:21   Dg Abd Acute W/chest  01/31/2013   CLINICAL DATA:  Abdominal pain, nausea.  EXAM: ACUTE ABDOMEN SERIES (ABDOMEN 2 VIEW & CHEST 1 VIEW)  COMPARISON:  01/05/2013  FINDINGS: There is no evidence of dilated bowel loops or free intraperitoneal air. Bowel loops project lateral to the left iliac wing suggesting body wall hernia, without evidence of obstruction. Heart size and mediastinal contours are within normal limits. Both lungs are clear. Degenerative changes in the right shoulder. Surgical clips in the right upper abdomen, and pelvis  IMPRESSION: 1. Nonobstructive bowel gas pattern  with probable left lower body wall hernia. 2. No free air. 3. No acute cardiopulmonary disease.   Electronically Signed   By: Oley Balm M.D.   On: 01/31/2013 18:09   EKG:  02/12/13: SB 56bpm, 1st degree AVB, baseline wander makes interpretation difficult but appears to have TWI V5-V6 02/14/13: SB 52bpm 1st degree AVB, TWI I avL, downsloping TW V5-V6 --> this tracing does not appear significantly different from 01/2013 or 08/2012  Physical Exam: Blood pressure 94/39, pulse 57, temperature 98.9 F (37.2 C), temperature source Oral, resp. rate 16, height 5' (1.524 m), weight 107 lb 6.4 oz (48.716 kg), SpO2 94.00%. General: Well developed frail elderly WF in no acute distress. Head: Normocephalic, atraumatic, sclera non-icteric, no xanthomas, nares are without discharge.  Neck: Negative for carotid bruits. JVD not elevated. Lungs: Clear bilaterally to auscultation without wheezes, rales, or rhonchi. Breathing is unlabored. Heart: RRR with S1 S2. 2/6 SEM without rubs or gallops appreciated. Abdomen: Soft, non-tender, non-distended with normoactive bowel sounds. Ostomy in place Msk:  Strength and tone appear normal for age. Extremities: No clubbing or cyanosis. No edema.  Distal pedal pulses are 2+ and equal bilaterally. Neuro: Alert and oriented X 3. No facial asymmetry. No focal deficit. Moves all extremities spontaneously. Psych:  Responds to questions appropriately with a normal affect but tangential.   Assessment and Plan:   1. Chest pain - ruled out for MI. Pt reports h/o 4 MI's but denies ever having further evaluation and refuses further cardiac testing. Would continue to treat medically for now. Consider decreasing ASA to 81mg  daily. Agree with statin initiation. Low dose Lopressor has been initiated but it is not clear that her HR and BP will be able to tolerate it - will d/w MD. D-dimer is mildly elevated but will defer further eval to ordering MD. No signs of DVT, not tachycardic,  unable to CTA due to risk of contrast nephropathy -- consider VQ. 2. Abnormal echo with mid-cavity obliteration - would typically benefit from BB but baseline HR/BP low this adm. 3. Transverse colitis - on abx. 4. Pulmonary nodules -  probably should f/u PCP for monitoring. 5. Schizophrenia - currently coherent but thought process somewhat disorganized at times. 6. CKD Stage IV - Cr fluctuates but overall stable from baseline. 7. HTN with hypotension his admission - appears asymptomatic from this. 8. Pancytopenia - per primary team. 9. E Coli UTI - resistant to Cipro - will defer abx expansion to primary team.  Signed, Ronie Spies PA-C 02/15/2013, 1:49 PM  History and all data above reviewed.  Patient examined.  I agree with the findings as above.  The patient exam reveals COR:RRR, systolic murmur, no change with Valsava  ,  Lungs: Clear  ,  Abd: Positive bowel sounds, no rebound no guarding, Ext No edema .Marland Kitchen  All available labs, radiology testing, previous records reviewed. Agree with documented assessment and plan. Chest pain has atypical and typical components.  However, she does not want further cardiovascular testing.  Medical management is limited by hypotension.  We are actually stopping the beta blocker as she has not been receiving this with the low BP.  I might suggest consideration of GI evaluation secondary to the lack of objective evidence of ischemia.  She is having some difficulty swallowing some foods.  If she agrees I would suggest cath.  Of note she does have mid cavity ablation on echo with LVH.  EF was 70%.  No change of therapy is indicated with this.     Fayrene Fearing Alphonso Gregson  3:27 PM  02/15/2013

## 2013-02-16 DIAGNOSIS — D649 Anemia, unspecified: Secondary | ICD-10-CM

## 2013-02-16 DIAGNOSIS — N184 Chronic kidney disease, stage 4 (severe): Secondary | ICD-10-CM

## 2013-02-16 LAB — CBC
HCT: 27.3 % — ABNORMAL LOW (ref 36.0–46.0)
Hemoglobin: 8.5 g/dL — ABNORMAL LOW (ref 12.0–15.0)
MCH: 30 pg (ref 26.0–34.0)
MCHC: 31.1 g/dL (ref 30.0–36.0)
MCV: 96.5 fL (ref 78.0–100.0)
RBC: 2.83 MIL/uL — ABNORMAL LOW (ref 3.87–5.11)

## 2013-02-16 LAB — COMPREHENSIVE METABOLIC PANEL
ALT: 6 U/L (ref 0–35)
AST: 10 U/L (ref 0–37)
Alkaline Phosphatase: 48 U/L (ref 39–117)
BUN: 25 mg/dL — ABNORMAL HIGH (ref 6–23)
CO2: 15 mEq/L — ABNORMAL LOW (ref 19–32)
Calcium: 9 mg/dL (ref 8.4–10.5)
GFR calc Af Amer: 19 mL/min — ABNORMAL LOW (ref >90)
GFR calc non Af Amer: 16 mL/min — ABNORMAL LOW (ref >90)
Glucose, Bld: 92 mg/dL (ref 70–99)
Potassium: 3.5 mEq/L (ref 3.5–5.1)
Sodium: 135 mEq/L (ref 135–145)

## 2013-02-16 MED ORDER — ASPIRIN 81 MG PO TBEC: 81.0000 mg | DELAYED_RELEASE_TABLET | Freq: Every day | ORAL | Status: AC

## 2013-02-16 MED ORDER — ATORVASTATIN CALCIUM 10 MG PO TABS: 10.0000 mg | ORAL_TABLET | Freq: Every day | ORAL | Status: DC

## 2013-02-16 MED ORDER — DEXTROSE 5 % IV SOLN
1.0000 g | INTRAVENOUS | Status: DC
  Filled 2013-02-16: qty 10

## 2013-02-16 MED ORDER — ASPIRIN EC 81 MG PO TBEC
81.0000 mg | DELAYED_RELEASE_TABLET | Freq: Every day | ORAL | Status: DC
  Administered 2013-02-16: 10:00:00 81 mg via ORAL
  Filled 2013-02-16: qty 1

## 2013-02-16 NOTE — Progress Notes (Signed)
SUBJECTIVE:  No further CP  OBJECTIVE:   Vitals:   Filed Vitals:   02/15/13 1011 02/15/13 1347 02/15/13 2225 02/16/13 0535  BP:  94/39 108/55 111/47  Pulse: 55 57 61 64  Temp:  98.9 F (37.2 C) 98.4 F (36.9 C) 97.5 F (36.4 C)  TempSrc:  Oral Oral Oral  Resp:  16 16 16   Height:      Weight:      SpO2:  94% 94% 97%   I&O's:   Intake/Output Summary (Last 24 hours) at 02/16/13 0744 Last data filed at 02/16/13 0536  Gross per 24 hour  Intake    120 ml  Output    665 ml  Net   -545 ml   TELEMETRY: Reviewed telemetry pt in sinus bradycardia     PHYSICAL EXAM General: Well developed, well nourished, in no acute distress Head: Eyes PERRLA, No xanthomas.   Normal cephalic and atramatic  Lungs:   Clear bilaterally to auscultation and percussion. Heart:   HRRR S1 S2 Pulses are 2+ & equal. Abdomen: Bowel sounds are positive, abdomen soft and non-tender without masses  Extremities:   No clubbing, cyanosis or edema.  DP +1 Neuro: Alert and oriented X 3. Psych:  Good affect, responds appropriately   LABS: Basic Metabolic Panel:  Recent Labs  16/10/96 1616 02/16/13 0440  NA 137 135  K 3.7 3.5  CL 110 109  CO2 17* 15*  GLUCOSE 92 92  BUN 23 25*  CREATININE 2.46* 2.59*  CALCIUM 8.9 9.0   Liver Function Tests:  Recent Labs  02/14/13 1616 02/16/13 0440  AST 11 10  ALT 7 6  ALKPHOS 48 48  BILITOT 0.2* 0.2*  PROT 5.4* 5.5*  ALBUMIN 3.0* 3.1*   No results found for this basename: LIPASE, AMYLASE,  in the last 72 hours CBC:  Recent Labs  02/15/13 0908 02/16/13 0440  WBC 3.3* 2.2*  HGB 9.0* 8.5*  HCT 29.1* 27.3*  MCV 96.7 96.5  PLT 79* 81*   Cardiac Enzymes:  Recent Labs  02/14/13 1616  TROPONINI <0.30   BNP: No components found with this basename: POCBNP,  D-Dimer:  Recent Labs  02/15/13 1021  DDIMER 0.70*   Hemoglobin A1C: No results found for this basename: HGBA1C,  in the last 72 hours Fasting Lipid Panel: No results found for this  basename: CHOL, HDL, LDLCALC, TRIG, CHOLHDL, LDLDIRECT,  in the last 72 hours Thyroid Function Tests: No results found for this basename: TSH, T4TOTAL, FREET3, T3FREE, THYROIDAB,  in the last 72 hours Anemia Panel: No results found for this basename: VITAMINB12, FOLATE, FERRITIN, TIBC, IRON, RETICCTPCT,  in the last 72 hours Coag Panel:   Lab Results  Component Value Date   INR 0.96 02/15/2013    RADIOLOGY: Ct Abdomen Pelvis Wo Contrast  02/14/2013   CLINICAL DATA:  Abdominal pain.  Ostomy.  EXAM: CT ABDOMEN AND PELVIS WITHOUT CONTRAST  TECHNIQUE: Multidetector CT imaging of the abdomen and pelvis was performed following the standard protocol without intravenous contrast.  COMPARISON:  Radiographs dated 01/31/2013 and CT dated 09/19/2012.  FINDINGS: Stable small left kidney and normal-sized right kidney. Bilateral renal cysts and multiple right renal calculi are again demonstrated. The largest renal calculus on the right measures 4 mm in maximum diameter. No ureteral calculi are seen. No significant collecting system or ureteral dilatation is seen on either side at this time. Again noted is surgical absence of the urinary bladder with an ileal conduit. There are multiple herniated  loops of bowel and abdominal fat within the previously demonstrated parastomal hernia. This includes colon and small bowel.  Interval diffuse low density wall thickening involving the right and transverse colon to the level of the hernia. The colon does not appear obstructed within the hernia. There is a smaller hernia on the right containing a portion of the proximal transverse colon at the level of the larger hernia on the left.  Multiple sigmoid colon diverticula without evidence of diverticulitis. No enlarged lymph nodes. Cholecystectomy clips. Multiple surgical clips in the pelvis and lower abdomen. Unremarkable liver, spleen, pancreas and adrenal glands. Mild right lower lobe atelectasis and minimal left lower lobe  atelectasis. The previously demonstrated 8 mm nodule in the left lower lobe measures 6 mm in maximum diameter today on image 10. There is also a 5 mm nodule more peripherally in the left lower lobe on the same image (image number 10). There are multiple additional smaller peripheral nodules in the left lower lobe that are better seen or new today.  Multiple metallic densities suggesting surgical clips are again demonstrated in the upper sacral spinal canal. Minimal right pleural effusion. Minimal right lower lobe atelectasis. Diffuse osteopenia. Lumbar spine degenerative changes. Right diaphragmatic eventration.  IMPRESSION: 1. Interval right an transverse colitis. This could be ischemic, infectious or inflammatory in nature. 2. Small bowel and colon with in a large left peristomal hernia without obstruction. 3. Smaller ventral hernia on the right containing a small amount of proximal transverse colon. 4. Stable left renal atrophy. 5. Sigmoid diverticulosis. 6. Minimal right pleural effusion. 7. Minimal right lower lobe atelectasis. 8. Nonobstructing right renal calculated. 9. Multiple small and tiny nodules in the left lower lobe with mild progression.   Electronically Signed   By: Gordan Payment M.D.   On: 02/14/2013 02:27   Dg Chest 1 View  02/15/2013   CLINICAL DATA:  Shortness of breath. Chest pain. Pre V/Q scan. History of hypertension, pneumonia, and MI.  EXAM: CHEST - 1 VIEW  COMPARISON:  02/12/2013  FINDINGS: Lungs are hyperinflated. Heart size is mildly enlarged. There is perihilar peribronchial thickening. Small left pleural effusion is present. There are no focal consolidations.  IMPRESSION: 1. Bronchitic changes. 2. Left pleural effusion. 3. No consolidations.   Electronically Signed   By: Rosalie Gums M.D.   On: 02/15/2013 19:14   Dg Chest Portable 1 View  02/12/2013   CLINICAL DATA:  Sudden onset of chest pain  EXAM: PORTABLE CHEST - 1 VIEW  COMPARISON:  Prior radiograph from 01/31/2013 as well as  earlier studies.  FINDINGS: The cardiac and mediastinal silhouettes are stable in size and contour, and remain within normal limits.  Lungs are normally inflated. Diffuse prominence of the interstitial markings is stable as compared to the prior exam. No focal infiltrate, pulmonary edema, or pleural effusion is identified no pneumothorax.  Osseous structures are unchanged.  IMPRESSION: Stable appearance of chronic lung changes. No acute cardiopulmonary process identified   Electronically Signed   By: Rise Mu M.D.   On: 02/12/2013 03:21   Dg Abd Acute W/chest  01/31/2013   CLINICAL DATA:  Abdominal pain, nausea.  EXAM: ACUTE ABDOMEN SERIES (ABDOMEN 2 VIEW & CHEST 1 VIEW)  COMPARISON:  01/05/2013  FINDINGS: There is no evidence of dilated bowel loops or free intraperitoneal air. Bowel loops project lateral to the left iliac wing suggesting body wall hernia, without evidence of obstruction. Heart size and mediastinal contours are within normal limits. Both lungs are clear. Degenerative  changes in the right shoulder. Surgical clips in the right upper abdomen, and pelvis  IMPRESSION: 1. Nonobstructive bowel gas pattern with probable left lower body wall hernia. 2. No free air. 3. No acute cardiopulmonary disease.   Electronically Signed   By: Oley Balm M.D.   On: 01/31/2013 18:09   Assessment and Plan:  1. Chest pain - ruled out for MI. Pt reports h/o 4 MI's but denies ever having further evaluation and refuses further cardiac testing. Would continue to treat medically for now.  Agree with statin/ASA. Low dose Lopressor was initiated but did not tolerate due to low BP.  D-dimer is mildly elevated and VQ scan done but results pending. 2. Abnormal echo with mid-cavity obliteration - would typically benefit from BB but baseline HR/BP low this adm.  3. Transverse colitis - on abx.  4. Pulmonary nodules - probably should f/u PCP for monitoring.  5. Schizophrenia - currently coherent but thought  process somewhat disorganized at times.  6. CKD Stage IV - Cr fluctuates but overall stable from baseline.  7. HTN with hypotension his admission - appears asymptomatic from this.  8. Pancytopenia - per primary team.  9. E Coli UTI - resistant to Cipro - will defer abx expansion to primary team.  10.  Dysphagia ? Need for GI eval      Quintella Reichert, MD  02/16/2013  7:44 AM

## 2013-02-16 NOTE — Progress Notes (Signed)
Patient complained of belly more distended than normal.  Page Marline Backbone, was informed of history of colitis, was asked if given cocktail, reported it was given around 2100.   Was informed to give patient her scheduled pain medication and continue to monitor.  Patient calmed down for a while.  Georgianne Fick, RN

## 2013-02-16 NOTE — Progress Notes (Signed)
[facility] PROGRESS NOTE  Rhonda [name] ZOX:[id number] DOB: 09/23/1930 DOA: 02/12/2013 PCP: No primary provider on file.  Assessment/Plan: 1. Chest pain 1. Cardiac enzymes neg x 3 2. 2D echo without WMA 3. Stable 4. Originally considering outpatient stress, however, pt again noted to have recurrent chest pain w/ sob 5. Repeat troponinwas neg x1 6. Started protonix and GI cocktail prn 7. D-dimer borderline elevated, VQ results pending 8. Cardiology recs appreciated 9. Decreased asa to 81mg  and beta blocker d/c'd secondary to low bp and bradycardia 10. Consider GI consult given hx of dysphagia 2. HTN 1. Stable 2. Cont current regimen 3. abd pain 1. Pt is s/p ostomy (from 64) 2. Palpable hernia on exam 3. Pt reports increasing distension and pain 4. CT abd done (see below) 4. Transverse colitis 1. Noted on 11/12 CT abd/pelvis 2. Afebrile 3. No leukocytosis 4. Started empiric flagyl 5. Supportive care 6. Question if presenting sx were actually related to colitis 5. Chronic back pain 1. Cont pain meds as tolerated 2. Added PRN dilaudid for breakthrough pain 6. Schizoaffective disorder 1. Appears to be stable 7. DVT prophylaxis 1. Heparin subQ 8. Suspected UTI 1. >100,000 multidrug sensitive ecoli 2. Was on empiric rocephin 3. Afebrile  Code Status: Full Family Communication: Pt in room (indicate person spoken with, relationship, and if by phone, the number) Disposition Plan: Pending  Procedures:  2d echo 02/13/13   Antibiotics Rocephin 02/12/13>>>  HPI/Subjective: Complains of worsening abd pain and back pain  Objective: Filed Vitals:   02/15/13 1011 02/15/13 1347 02/15/13 2225 02/16/13 0535  BP:  94/39 108/55 111/47  Pulse: 55 57 61 64  Temp:  98.9 F (37.2 C) 98.4 F (36.9 C) 97.5 F (36.4 C)  TempSrc:  Oral Oral Oral  Resp:  16 16 16   Height:      Weight:      SpO2:  94% 94% 97%    Intake/Output Summary (Last 24 hours) at 02/16/13  0918 Last data filed at 02/16/13 0536  Gross per 24 hour  Intake    120 ml  Output    665 ml  Net   -545 ml   Filed Weights   02/12/13 0456  Weight: 48.716 kg (107 lb 6.4 oz)    Exam:   General:  Awake, in nad  Cardiovascular: regular, s1, s2  Respiratory: normal resp effort, no wheezing  Abdomen: soft, palpable abd hernia on both L and R sides of ostomy bag  Musculoskeletal: perfused, no clubbing   Data Reviewed: Basic Metabolic Panel:  Recent Labs Lab 02/12/13 0256 02/12/13 0632 02/14/13 1616 02/16/13 0440  NA 145  --  137 135  K 3.8  --  3.7 3.5  CL 119*  --  110 109  CO2  --   --  17* 15*  GLUCOSE 92  --  92 92  BUN 27*  --  23 25*  CREATININE 2.20* 1.77* 2.46* 2.59*  CALCIUM  --   --  8.9 9.0   Liver Function Tests:  Recent Labs Lab 02/14/13 1616 02/16/13 0440  AST 11 10  ALT 7 6  ALKPHOS 48 48  BILITOT 0.2* 0.2*  PROT 5.4* 5.5*  ALBUMIN 3.0* 3.1*   No results found for this basename: LIPASE, AMYLASE,  in the last 168 hours No results found for this basename: AMMONIA,  in the last 168 hours CBC:  Recent Labs Lab 02/12/13 0335 02/12/13 0632 02/14/13 1616 02/15/13 0908 02/16/13 0440  WBC 4.8 2.8* 2.8*  3.3* 2.2*  HGB 10.7* 8.9* 8.8* 9.0* 8.5*  HCT 32.0* 27.2* 28.4* 29.1* 27.3*  MCV 92.8 93.5 96.6 96.7 96.5  PLT 141* 103* 91* 79* 81*   Cardiac Enzymes:  Recent Labs Lab 02/12/13 1610 02/12/13 1227 02/12/13 1915 02/14/13 1616  TROPONINI <0.30 <0.30 <0.30 <0.30   BNP (last 3 results) No results found for this basename: PROBNP,  in the last 8760 hours CBG: No results found for this basename: GLUCAP,  in the last 168 hours  Recent Results (from the past 240 hour(s))  URINE CULTURE     Status: None   Collection Time    02/12/13 10:53 AM      Result Value Range Status   Specimen Description URINE, CATHETERIZED   Final   Special Requests NONE   Final   Culture  Setup Time     Final   Value: 02/12/2013 14:37     Performed at  [facility]   Culture     Final   Value: >=100,000 COLONIES/mL ESCHERICHIA COLI     Performed at [facility]   Report Status 02/14/2013 FINAL   Final   Organism ID, Bacteria ESCHERICHIA COLI   Final     Studies: Dg Chest 1 View  02/15/2013   CLINICAL DATA:  Shortness of breath. Chest pain. Pre V/Q scan. History of hypertension, pneumonia, and MI.  EXAM: CHEST - 1 VIEW  COMPARISON:  02/12/2013  FINDINGS: Lungs are hyperinflated. Heart size is mildly enlarged. There is perihilar peribronchial thickening. Small left pleural effusion is present. There are no focal consolidations.  IMPRESSION: 1. Bronchitic changes. 2. Left pleural effusion. 3. No consolidations.   Electronically Signed   By: Rosalie Gums M.D.   On: 02/15/2013 19:14    Scheduled Meds: . aspirin EC  81 mg Oral Daily  . atorvastatin  10 mg Oral q1800  . docusate sodium  100 mg Oral BID  . enoxaparin (LOVENOX) injection  50 mg Subcutaneous Q24H  . metroNIDAZOLE  500 mg Oral Q8H  . morphine  15 mg Oral Q8H  . pantoprazole  40 mg Oral Daily  . sodium chloride  3 mL Intravenous Q12H  . sodium chloride  3 mL Intravenous Q12H   Continuous Infusions:   Principal Problem:   Noninfectious gastroenteritis and colitis Active Problems:   Delusional disorder(297.1)   Atypical chest pain   HTN (hypertension)   H/O myocardial infarction, greater than 8 weeks   Pulmonary nodule   Schizophrenia   CKD (chronic kidney disease) stage 4, GFR 15-29 ml/min   Pancytopenia  Time spent:  Jacqulene Huntley K  [facility] Pager [phone]. If 7PM-7AM, please contact night-coverage at [email or web address], password [facility] 02/16/2013, 9:18 AM  LOS: 4 days

## 2013-02-16 NOTE — Progress Notes (Signed)
Addendum to note - Reports of swollen abdomen called to Rhonda [name] and not May Collins

## 2013-02-16 NOTE — Progress Notes (Addendum)
ANTIBIOTIC CONSULT NOTE - INITIAL  Pharmacy Consult for Rocephin Indication: UTI  Allergies  Allergen Reactions  . Macrodantin [Nitrofurantoin Macrocrystal] Other (See Comments)    "Makes her fall"  . Septra [Sulfamethoxazole-Trimethoprim] Other (See Comments)    "Makes her fall"  . Soma [Carisoprodol] Other (See Comments)    "Makes her fall"    Patient Measurements: Height: 5' (152.4 cm) Weight: 107 lb 6.4 oz (48.716 kg) IBW/kg (Calculated) : 45.5  Vital Signs: Temp: 97.5 F (36.4 C) (11/15 0535) Temp src: Oral (11/15 0535) BP: 111/47 mmHg (11/15 0535) Pulse Rate: 64 (11/15 0535) Intake/Output from previous day: 11/14 0701 - 11/15 0700 In: 120 [P.O.:120] Out: 665 [Urine:665] Intake/Output from this shift:    Labs:  Recent Labs  02/14/13 1616 02/15/13 0908 02/16/13 0440  WBC 2.8* 3.3* 2.2*  HGB 8.8* 9.0* 8.5*  PLT 91* 79* 81*  CREATININE 2.46*  --  2.59*   Estimated Creatinine Clearance: 12.4 ml/min (by C-G formula based on Cr of 2.59). No results found for this basename: VANCOTROUGH, Leodis Binet, VANCORANDOM, GENTTROUGH, GENTPEAK, GENTRANDOM, TOBRATROUGH, TOBRAPEAK, TOBRARND, AMIKACINPEAK, AMIKACINTROU, AMIKACIN,  in the last 72 hours   Microbiology: Recent Results (from the past 720 hour(s))  URINE CULTURE     Status: None   Collection Time    01/31/13  7:10 PM      Result Value Range Status   Specimen Description URINE, CLEAN CATCH   Final   Special Requests NONE   Final   Culture  Setup Time     Final   Value: 01/31/2013 23:35     Performed at [facility] Count     Final   Value: >=100,000 COLONIES/ML     Performed at [facility]   Culture     Final   Value: ESCHERICHIA COLI     Performed at [facility]   Report Status 02/02/2013 FINAL   Final   Organism ID, Bacteria ESCHERICHIA COLI   Final  URINE CULTURE     Status: None   Collection Time    02/12/13 10:53 AM      Result Value Range Status   Specimen  Description URINE, CATHETERIZED   Final   Special Requests NONE   Final   Culture  Setup Time     Final   Value: 02/12/2013 14:37     Performed at [facility]   Culture     Final   Value: >=100,000 COLONIES/mL ESCHERICHIA COLI     Performed at [facility]   Report Status 02/14/2013 FINAL   Final   Organism ID, Bacteria ESCHERICHIA COLI   Final    Medical History: Past Medical History  Diagnosis Date  . Hypertension   . Anemia     a. H/o both iron def and B12 def.  . Arthritis   . Pneumonia   . Goiter   . Schizo affective schizophrenia   . Myocardial infarct     a. Hx of 4 MIs per pt with cardiologist in [place].  . Chronic back pain   . Ventral hernia   . CKD (chronic kidney disease)   . Diverticular disease   . Esophageal disorder     a. esophageal stricture and esophagitis with dilatation in 1997..  . Chronic lung disease     a. Per records - prior nocturnal hypoxia documented, PFTs 2001 - Decreased vital capacity, decreased with neg PE w/u at that time.  . Stroke  a. Pt reports she "tried to have a stroke" after last MI <1 yr ago.    Medications:  Anti-infectives   Start     Dose/Rate Route Frequency Ordered Stop   02/16/13 1000  cefTRIAXone (ROCEPHIN) 1 g in dextrose 5 % 50 mL IVPB     1 g 100 mL/hr over 30 Minutes Intravenous Every 24 hours 02/16/13 0924     02/15/13 1200  metroNIDAZOLE (FLAGYL) tablet 500 mg     500 mg Oral 3 times per day 02/15/13 0939     02/15/13 1200  ciprofloxacin (CIPRO) tablet 500 mg  Status:  Discontinued     500 mg Oral 2 times daily 02/15/13 0949 02/15/13 1042   02/15/13 1200  ciprofloxacin (CIPRO) tablet 500 mg  Status:  Discontinued     500 mg Oral Daily 02/15/13 1042 02/16/13 0731   02/15/13 0000  ciprofloxacin (CIPRO) 500 MG tablet     500 mg Oral 2 times daily 02/15/13 0951     02/15/13 0000  metroNIDAZOLE (FLAGYL) 500 MG tablet     500 mg Oral Every 8 hours 02/15/13 0951     02/14/13 0800  metroNIDAZOLE  (FLAGYL) IVPB 500 mg  Status:  Discontinued     500 mg 100 mL/hr over 60 Minutes Intravenous Every 8 hours 02/14/13 0722 02/15/13 0939   02/12/13 1800  cefTRIAXone (ROCEPHIN) 1 g in dextrose 5 % 50 mL IVPB  Status:  Discontinued     1 g 100 mL/hr over 30 Minutes Intravenous Every 24 hours 02/12/13 1747 02/15/13 0949     Assessment: 79yo F with E.coli UTI. Previously on Rocephin this admission, then switched to Cipro/Flagyl for suspected colitis. Now stopping Cipro and pharmacy is asked to resume Rocephin. CKD, CrCl currently estimated ~7ml/min. No dose adjustment is necessary for renal dysfunction.   Goal of Therapy:  Eradication of infection. Appropriate regimen for patient.  Plan:   Rocephin 1g IV q24h.  Pharmacy will discontinue follow-up.  Charolotte Eke, PharmD, pager [phone]. 02/16/2013,9:28 AM.

## 2013-02-20 ENCOUNTER — Emergency Department (HOSPITAL_COMMUNITY): Payer: Medicare Other

## 2013-02-20 ENCOUNTER — Emergency Department (HOSPITAL_COMMUNITY)
Admission: EM | Admit: 2013-02-20 | Discharge: 2013-02-20 | Disposition: A | Discharge status: Home / Self Care | Payer: Medicare Other | Attending: Emergency Medicine | Admitting: Emergency Medicine

## 2013-02-20 ENCOUNTER — Encounter (HOSPITAL_COMMUNITY): Payer: Self-pay | Admitting: Emergency Medicine

## 2013-02-20 DIAGNOSIS — G8929 Other chronic pain: Secondary | ICD-10-CM | POA: Insufficient documentation

## 2013-02-20 DIAGNOSIS — Z8719 Personal history of other diseases of the digestive system: Secondary | ICD-10-CM | POA: Insufficient documentation

## 2013-02-20 DIAGNOSIS — Z8659 Personal history of other mental and behavioral disorders: Secondary | ICD-10-CM | POA: Insufficient documentation

## 2013-02-20 DIAGNOSIS — M545 Low back pain, unspecified: Secondary | ICD-10-CM | POA: Insufficient documentation

## 2013-02-20 DIAGNOSIS — M549 Dorsalgia, unspecified: Secondary | ICD-10-CM

## 2013-02-20 DIAGNOSIS — Z79899 Other long term (current) drug therapy: Secondary | ICD-10-CM | POA: Insufficient documentation

## 2013-02-20 DIAGNOSIS — I129 Hypertensive chronic kidney disease with stage 1 through stage 4 chronic kidney disease, or unspecified chronic kidney disease: Secondary | ICD-10-CM | POA: Insufficient documentation

## 2013-02-20 DIAGNOSIS — R509 Fever, unspecified: Secondary | ICD-10-CM | POA: Insufficient documentation

## 2013-02-20 DIAGNOSIS — Z8709 Personal history of other diseases of the respiratory system: Secondary | ICD-10-CM | POA: Insufficient documentation

## 2013-02-20 DIAGNOSIS — Z7982 Long term (current) use of aspirin: Secondary | ICD-10-CM | POA: Insufficient documentation

## 2013-02-20 DIAGNOSIS — Z8673 Personal history of transient ischemic attack (TIA), and cerebral infarction without residual deficits: Secondary | ICD-10-CM | POA: Insufficient documentation

## 2013-02-20 DIAGNOSIS — E876 Hypokalemia: Secondary | ICD-10-CM | POA: Insufficient documentation

## 2013-02-20 DIAGNOSIS — R634 Abnormal weight loss: Secondary | ICD-10-CM | POA: Insufficient documentation

## 2013-02-20 DIAGNOSIS — M129 Arthropathy, unspecified: Secondary | ICD-10-CM | POA: Insufficient documentation

## 2013-02-20 DIAGNOSIS — Z8701 Personal history of pneumonia (recurrent): Secondary | ICD-10-CM | POA: Insufficient documentation

## 2013-02-20 DIAGNOSIS — I252 Old myocardial infarction: Secondary | ICD-10-CM | POA: Insufficient documentation

## 2013-02-20 DIAGNOSIS — Z862 Personal history of diseases of the blood and blood-forming organs and certain disorders involving the immune mechanism: Secondary | ICD-10-CM | POA: Insufficient documentation

## 2013-02-20 DIAGNOSIS — N189 Chronic kidney disease, unspecified: Secondary | ICD-10-CM | POA: Insufficient documentation

## 2013-02-20 LAB — URINE MICROSCOPIC-ADD ON

## 2013-02-20 LAB — URINALYSIS, ROUTINE W REFLEX MICROSCOPIC
Bilirubin Urine: NEGATIVE
Glucose, UA: NEGATIVE mg/dL
Ketones, ur: NEGATIVE mg/dL
Specific Gravity, Urine: 1.015 (ref 1.005–1.030)
Urobilinogen, UA: 0.2 mg/dL (ref 0.0–1.0)
pH: 6.5 (ref 5.0–8.0)

## 2013-02-20 LAB — CBC WITH DIFFERENTIAL/PLATELET
Basophils Absolute: 0 10*3/uL (ref 0.0–0.1)
Eosinophils Absolute: 0 10*3/uL (ref 0.0–0.7)
Eosinophils Relative: 1 % (ref 0–5)
Lymphocytes Relative: 22 % (ref 12–46)
MCH: 30.2 pg (ref 26.0–34.0)
MCHC: 32.8 g/dL (ref 30.0–36.0)
MCV: 92.1 fL (ref 78.0–100.0)
Neutrophils Relative %: 68 % (ref 43–77)
Platelets: 121 10*3/uL — ABNORMAL LOW (ref 150–400)
RDW: 14.9 % (ref 11.5–15.5)
WBC: 2.4 10*3/uL — ABNORMAL LOW (ref 4.0–10.5)

## 2013-02-20 LAB — COMPREHENSIVE METABOLIC PANEL
ALT: 11 U/L (ref 0–35)
AST: 16 U/L (ref 0–37)
Calcium: 9.4 mg/dL (ref 8.4–10.5)
Sodium: 141 mEq/L (ref 135–145)
Total Bilirubin: 0.4 mg/dL (ref 0.3–1.2)
Total Protein: 6.7 g/dL (ref 6.0–8.3)

## 2013-02-20 MED ORDER — POTASSIUM CHLORIDE 10 MEQ/100ML IV SOLN
10.0000 meq | Freq: Once | INTRAVENOUS | Status: AC
  Administered 2013-02-20: 10 meq via INTRAVENOUS
  Filled 2013-02-20: qty 100

## 2013-02-20 MED ORDER — POTASSIUM CHLORIDE ER 20 MEQ PO TBCR: 10.0000 meq | EXTENDED_RELEASE_TABLET | Freq: Two times a day (BID) | ORAL | Status: DC

## 2013-02-20 MED ORDER — MORPHINE SULFATE 4 MG/ML IJ SOLN
4.0000 mg | Freq: Once | INTRAMUSCULAR | Status: AC
  Administered 2013-02-20: 4 mg via INTRAVENOUS
  Filled 2013-02-20: qty 1

## 2013-02-20 MED ORDER — POTASSIUM CHLORIDE CRYS ER 20 MEQ PO TBCR
40.0000 meq | EXTENDED_RELEASE_TABLET | Freq: Once | ORAL | Status: AC
  Administered 2013-02-20: 40 meq via ORAL
  Filled 2013-02-20: qty 2

## 2013-02-20 NOTE — ED Provider Notes (Signed)
CSN: [id number]     Arrival date & time 02/20/13  0804 History   First MD Initiated Contact with Patient 02/20/13 [phone]     Chief Complaint  Patient presents with  . Back Pain    Patient is a 79 y.o. female presenting with back pain. The history is provided by the patient.  Back Pain Location:  Lumbar spine Quality:  Aching Radiates to: to both hips. Pain severity:  Moderate Pain is:  Unable to specify Onset quality:  Gradual Duration:  1 week Timing:  Constant Chronicity:  Chronic Context: not falling and not MVA   Relieved by:  Nothing Worsened by:  Twisting, bending and ambulation Associated symptoms: fever and weight loss   Associated symptoms: no abdominal pain, no chest pain and no dysuria   Associated symptoms comment:  Pt states she has been losing weight over the last month.  She has had nausea but no vomiting or diarrhea.  She has also felt like she has been running fevers at home. She has not seen her doctor for this problem but decided to come to the ED this morning because of the persistent symptoms.  Past Medical History  Diagnosis Date  . Hypertension   . Anemia     a. H/o both iron def and B12 def.  . Arthritis   . Pneumonia   . Goiter   . Schizo affective schizophrenia   . Myocardial infarct     a. Hx of 4 MIs per pt with cardiologist in [place].  . Chronic back pain   . Ventral hernia   . CKD (chronic kidney disease)   . Diverticular disease   . Esophageal disorder     a. esophageal stricture and esophagitis with dilatation in 1997..  . Chronic lung disease     a. Per records - prior nocturnal hypoxia documented, PFTs 2001 - Decreased vital capacity, decreased with neg PE w/u at that time.  . Stroke     a. Pt reports she "tried to have a stroke" after last MI <1 yr ago.   Past Surgical History  Procedure Laterality Date  . Abdominal hysterectomy    . Back surgery    . Hernia repair    . Cholecystectomy    . Revision urostomy cutaneous      Family History  Problem Relation Age of Onset  . Alzheimer's disease Mother   . CAD Father    History  Substance Use Topics  . Smoking status: Never Smoker   . Smokeless tobacco: Never Used  . Alcohol Use: No   OB History   Grav Para Term Preterm Abortions TAB SAB Ect Mult Living                 Review of Systems  Constitutional: Positive for fever and weight loss.  Cardiovascular: Negative for chest pain.  Gastrointestinal: Negative for abdominal pain.  Genitourinary: Negative for dysuria.  Musculoskeletal: Positive for back pain.  All other systems reviewed and are negative.    Allergies  Macrodantin; Septra; and Soma  Home Medications   Current Outpatient Rx  Name  Route  Sig  Dispense  Refill  . aspirin EC 81 MG EC tablet   Oral   Take 1 tablet (81 mg total) by mouth daily.         Marland Kitchen morphine (MS CONTIN) 15 MG 12 hr tablet   Oral   Take 1 tablet (15 mg total) by mouth every 8 (eight) hours.  30 tablet   0   . atorvastatin (LIPITOR) 10 MG tablet   Oral   Take 1 tablet (10 mg total) by mouth daily at 6 PM.   30 tablet   0   . metroNIDAZOLE (FLAGYL) 500 MG tablet   Oral   Take 1 tablet (500 mg total) by mouth every 8 (eight) hours.   21 tablet   0   . pantoprazole (PROTONIX) 40 MG tablet   Oral   Take 1 tablet (40 mg total) by mouth daily.   30 tablet   0   . potassium chloride 20 MEQ TBCR   Oral   Take 10 mEq by mouth 2 (two) times daily.   10 tablet   0    BP 160/65  Pulse 59  Temp(Src) 98 F (36.7 C) (Oral)  Resp 17  Wt 103 lb 12.8 oz (47.083 kg)  SpO2 99% Physical Exam  Nursing note and vitals reviewed. Constitutional: No distress.  Elderly, frail thin  HENT:  Head: Normocephalic and atraumatic.  Right Ear: External ear normal.  Left Ear: External ear normal.  Nose: Nose normal.  Eyes: Conjunctivae and EOM are normal. Right eye exhibits no discharge. Left eye exhibits no discharge. No scleral icterus.  Neck: Neck supple.  No tracheal deviation present.  Cardiovascular: Normal rate and regular rhythm.  Exam reveals friction rub.   No murmur heard. Pulmonary/Chest: Effort normal and breath sounds normal. No stridor. No respiratory distress.  Abdominal: She exhibits no distension and no mass. There is no tenderness. There is no rebound and no guarding.  Colostomy bag, no surrounding erythema  Musculoskeletal: She exhibits no edema.       Lumbar back: She exhibits tenderness and pain. She exhibits no bony tenderness, no swelling, no edema and no spasm.  Pt is able to sit up in bed without difficulty,   Neurological: She is alert. She is not disoriented. No sensory deficit. Cranial nerve deficit: no gross deficits. She exhibits normal muscle tone. Coordination normal.  Equal strength bilateral lower extremities, planar flexion and dorsiflexion, sensation intact to light touch  Skin: Skin is warm and dry. No rash noted. She is not diaphoretic. No erythema.  Psychiatric: Her behavior is normal.    ED Course  Procedures (including critical care time) Labs Review Labs Reviewed  CBC WITH DIFFERENTIAL - Abnormal; Notable for the following:    WBC 2.4 (*)    RBC 3.18 (*)    Hemoglobin 9.6 (*)    HCT 29.3 (*)    Platelets 121 (*)    Neutro Abs 1.6 (*)    Lymphs Abs 0.5 (*)    All other components within normal limits  COMPREHENSIVE METABOLIC PANEL - Abnormal; Notable for the following:    Potassium 2.7 (*)    Chloride 113 (*)    CO2 18 (*)    Glucose, Bld 104 (*)    Creatinine, Ser 2.01 (*)    GFR calc non Af Amer 22 (*)    GFR calc Af Amer 26 (*)    All other components within normal limits  LIPASE, BLOOD - Abnormal; Notable for the following:    Lipase 120 (*)    All other components within normal limits  URINALYSIS, ROUTINE W REFLEX MICROSCOPIC - Abnormal; Notable for the following:    Hgb urine dipstick TRACE (*)    Protein, ur 100 (*)    All other components within normal limits  URINE  MICROSCOPIC-ADD ON - Abnormal; Notable for  the following:    Bacteria, UA MANY (*)    All other components within normal limits   Imaging Review Dg Lumbar Spine Complete  02/20/2013   CLINICAL DATA:  Pain across lower back radiating into both sites of buttock, weight loss  EXAM: LUMBAR SPINE - COMPLETE 4+ VIEW  COMPARISON:  08/28/2012  FINDINGS: Osseous demineralization.  Five non-rib bearing lumbar vertebrae.  Disc space narrowing with endplate spur formation at L2-L3.  Vertebral body and remaining disc space heights otherwise maintained.  No acute fracture, subluxation or bone destruction.  No spondylolysis.  Minimal atherosclerotic calcification aorta.  Mild facet degenerative changes L5-S1.  SI joints symmetric.  Numerous surgical clips in pelvis.  IMPRESSION: Degenerative disc disease changes at L2-L3.  Minimal facet degenerative changes at L5-S1.  Osseous demineralization.   Electronically Signed   By: Ulyses Southward M.D.   On: 02/20/2013 09:10    EKG Interpretation   None       Patient's weight she been reviewed. Her current weight is 103 pounds. July of last year her weight was 110 pounds. She appears to have a gradual decrease but not a sudden drastic change in her weight.  Vitals with BMI 02/12/2013 02/12/2013 02/12/2013 02/12/2013  Weight    107 lbs 6 oz    Vitals with BMI 01/12/2013 01/11/2013 01/11/2013 01/11/2013  Weight 113 lbs 5 oz      Vitals with BMI 01/11/2013 01/11/2013 01/11/2013 01/10/2013  Weight 106 lbs 15 oz 105 lbs 10 oz     Vitals with BMI 01/10/2013 01/10/2013 01/10/2013 01/09/2013 01/09/2013  Weight   104 lbs 15 oz     Vitals with BMI 01/09/2013 01/08/2013 01/08/2013 01/08/2013 01/07/2013  Weight 106 lbs 1 oz   103 lbs 6 oz    Vitals with BMI 01/07/2013 01/07/2013 01/07/2013 01/06/2013 01/06/2013  Weight   105 lbs 2 oz     Vitals with BMI 01/06/2013 01/05/2013 01/05/2013 01/05/2013 01/05/2013  Weight 104 lbs 1 oz       Vitals with BMI 01/05/2013 01/05/2013 01/05/2013  01/05/2013 01/05/2013  Weight 104 lbs 8 oz       Vitals with BMI 01/05/2013 01/05/2013 10/05/2012 10/05/2012 10/05/2012  Weight     106 lbs   Vitals with BMI 09/19/2012 09/19/2012 08/30/2012 08/29/2012 08/29/2012  Weight   115 lbs 10 oz     Vitals with BMI 08/29/2012 08/28/2012 08/28/2012 08/28/2012 08/28/2012  Weight 111 lbs 9 oz  110 lbs 4 oz     Vitals with BMI 08/28/2012 08/28/2012 03/19/2012 03/19/2012 03/19/2012  Weight        Vitals with BMI 11/02/2011 11/01/2011 11/01/2011 11/01/2011 11/01/2011  Weight    124 lbs 8 oz 125 lbs 8 oz   Vitals with BMI 11/01/2011 10/31/2011 10/31/2011 10/31/2011 10/31/2011  Weight   110 lbs     Vitals with BMI 10/31/2011 10/31/2011 10/21/2011 10/21/2011  Weight  110 lbs     Medications  potassium chloride 10 mEq in 100 mL IVPB (10 mEq Intravenous New Bag/Given 02/20/13 1058)  morphine 4 MG/ML injection 4 mg (4 mg Intravenous Given 02/20/13 1001)  potassium chloride SA (K-DUR,KLOR-CON) CR tablet 40 mEq (40 mEq Oral Given 02/20/13 1058)    MDM   1. Back pain   2. Hypokalemia     Patient's laboratory tests show hypokalemia. Patient does have chronic issues with metabolic acidosis and hyperchloremia. This not changed from prior laboratory values. Her anemia is stable. Her renal insufficiency is stable as well. Patient has an ileostomy/urostomy.  I doubt infection based on her urinalysis.  Patient does have chronic back pain is on chronic medications.  At this time there does not appear to be any evidence of an acute emergency medical condition and the patient appears stable for discharge with appropriate outpatient follow up.     Celene Kras, MD 02/20/13 [phone]

## 2013-02-20 NOTE — ED Notes (Addendum)
Pt comes here today c/o back pain that's been going on for about a week. Pt denies falling or injuring herself.  Pt also c/o losing weight, pt guesses she has lost approx 15lbs in past month.  Pt also c/o abd pain about week with nausea but denies vomiting or diarrhea.

## 2013-02-20 NOTE — ED Notes (Signed)
Critical lab value Potassium 2.7. Made Primary RN Morrie Sheldon aware.

## 2013-04-23 ENCOUNTER — Encounter (HOSPITAL_COMMUNITY): Payer: Self-pay | Admitting: Emergency Medicine

## 2013-04-23 ENCOUNTER — Emergency Department (HOSPITAL_COMMUNITY): Payer: Medicare Other

## 2013-04-23 ENCOUNTER — Observation Stay (HOSPITAL_COMMUNITY)
Admission: EM | Admit: 2013-04-23 | Discharge: 2013-04-24 | Disposition: A | Discharge status: Home / Self Care | Payer: Medicare Other | Attending: Internal Medicine | Admitting: Internal Medicine

## 2013-04-23 DIAGNOSIS — D61818 Other pancytopenia: Secondary | ICD-10-CM | POA: Insufficient documentation

## 2013-04-23 DIAGNOSIS — D696 Thrombocytopenia, unspecified: Secondary | ICD-10-CM | POA: Insufficient documentation

## 2013-04-23 DIAGNOSIS — J984 Other disorders of lung: Secondary | ICD-10-CM | POA: Insufficient documentation

## 2013-04-23 DIAGNOSIS — I129 Hypertensive chronic kidney disease with stage 1 through stage 4 chronic kidney disease, or unspecified chronic kidney disease: Secondary | ICD-10-CM | POA: Insufficient documentation

## 2013-04-23 DIAGNOSIS — N289 Disorder of kidney and ureter, unspecified: Secondary | ICD-10-CM

## 2013-04-23 DIAGNOSIS — N189 Chronic kidney disease, unspecified: Secondary | ICD-10-CM | POA: Diagnosis present

## 2013-04-23 DIAGNOSIS — R109 Unspecified abdominal pain: Secondary | ICD-10-CM | POA: Insufficient documentation

## 2013-04-23 DIAGNOSIS — R0789 Other chest pain: Principal | ICD-10-CM | POA: Insufficient documentation

## 2013-04-23 DIAGNOSIS — R079 Chest pain, unspecified: Secondary | ICD-10-CM | POA: Diagnosis present

## 2013-04-23 DIAGNOSIS — D649 Anemia, unspecified: Secondary | ICD-10-CM

## 2013-04-23 DIAGNOSIS — F209 Schizophrenia, unspecified: Secondary | ICD-10-CM | POA: Insufficient documentation

## 2013-04-23 DIAGNOSIS — G8929 Other chronic pain: Secondary | ICD-10-CM | POA: Insufficient documentation

## 2013-04-23 DIAGNOSIS — Z7982 Long term (current) use of aspirin: Secondary | ICD-10-CM | POA: Insufficient documentation

## 2013-04-23 DIAGNOSIS — R0602 Shortness of breath: Secondary | ICD-10-CM | POA: Insufficient documentation

## 2013-04-23 DIAGNOSIS — Z8673 Personal history of transient ischemic attack (TIA), and cerebral infarction without residual deficits: Secondary | ICD-10-CM | POA: Insufficient documentation

## 2013-04-23 DIAGNOSIS — I252 Old myocardial infarction: Secondary | ICD-10-CM | POA: Insufficient documentation

## 2013-04-23 DIAGNOSIS — I2510 Atherosclerotic heart disease of native coronary artery without angina pectoris: Secondary | ICD-10-CM | POA: Insufficient documentation

## 2013-04-23 DIAGNOSIS — E872 Acidosis, unspecified: Secondary | ICD-10-CM | POA: Diagnosis present

## 2013-04-23 DIAGNOSIS — I10 Essential (primary) hypertension: Secondary | ICD-10-CM | POA: Diagnosis present

## 2013-04-23 DIAGNOSIS — N184 Chronic kidney disease, stage 4 (severe): Secondary | ICD-10-CM | POA: Insufficient documentation

## 2013-04-23 DIAGNOSIS — E538 Deficiency of other specified B group vitamins: Secondary | ICD-10-CM | POA: Insufficient documentation

## 2013-04-23 DIAGNOSIS — D509 Iron deficiency anemia, unspecified: Secondary | ICD-10-CM | POA: Insufficient documentation

## 2013-04-23 LAB — TYPE AND SCREEN
ABO/RH(D): O POS
Antibody Screen: POSITIVE
DAT, IGG: NEGATIVE

## 2013-04-23 LAB — COMPREHENSIVE METABOLIC PANEL
ALBUMIN: 3.3 g/dL — AB (ref 3.5–5.2)
ALK PHOS: 74 U/L (ref 39–117)
ALT: 7 U/L (ref 0–35)
AST: 14 U/L (ref 0–37)
BUN: 17 mg/dL (ref 6–23)
CO2: 15 meq/L — AB (ref 19–32)
Calcium: 8.9 mg/dL (ref 8.4–10.5)
Chloride: 113 mEq/L — ABNORMAL HIGH (ref 96–112)
Creatinine, Ser: 1.85 mg/dL — ABNORMAL HIGH (ref 0.50–1.10)
GFR calc Af Amer: 29 mL/min — ABNORMAL LOW (ref >90)
GFR, EST NON AFRICAN AMERICAN: 25 mL/min — AB (ref >90)
Glucose, Bld: 87 mg/dL (ref 70–99)
POTASSIUM: 3.6 meq/L — AB (ref 3.7–5.3)
Sodium: 143 mEq/L (ref 137–147)
Total Bilirubin: 0.4 mg/dL (ref 0.3–1.2)
Total Protein: 6.2 g/dL (ref 6.0–8.3)

## 2013-04-23 LAB — CBC WITH DIFFERENTIAL/PLATELET
BASOS PCT: 0 % (ref 0–1)
Basophils Absolute: 0 10*3/uL (ref 0.0–0.1)
Eosinophils Absolute: 0.1 10*3/uL (ref 0.0–0.7)
Eosinophils Relative: 2 % (ref 0–5)
HEMATOCRIT: 29.7 % — AB (ref 36.0–46.0)
Hemoglobin: 9.3 g/dL — ABNORMAL LOW (ref 12.0–15.0)
LYMPHS PCT: 26 % (ref 12–46)
Lymphs Abs: 0.8 10*3/uL (ref 0.7–4.0)
MCH: 29.4 pg (ref 26.0–34.0)
MCHC: 31.3 g/dL (ref 30.0–36.0)
MCV: 94 fL (ref 78.0–100.0)
Monocytes Absolute: 0.4 10*3/uL (ref 0.1–1.0)
Monocytes Relative: 11 % (ref 3–12)
NEUTROS ABS: 1.9 10*3/uL (ref 1.7–7.7)
NEUTROS PCT: 60 % (ref 43–77)
Platelets: 122 10*3/uL — ABNORMAL LOW (ref 150–400)
RBC: 3.16 MIL/uL — AB (ref 3.87–5.11)
RDW: 15.1 % (ref 11.5–15.5)
WBC: 3.2 10*3/uL — AB (ref 4.0–10.5)

## 2013-04-23 LAB — POCT I-STAT TROPONIN I: TROPONIN I, POC: 0.01 ng/mL (ref 0.00–0.08)

## 2013-04-23 LAB — ABO/RH: ABO/RH(D): O POS

## 2013-04-23 LAB — TROPONIN I: Troponin I: <0.3 ng/mL (ref <0.30)

## 2013-04-23 MED ORDER — MORPHINE SULFATE ER 15 MG PO TBCR
15.0000 mg | EXTENDED_RELEASE_TABLET | Freq: Three times a day (TID) | ORAL | Status: DC
  Administered 2013-04-23 – 2013-04-24 (×2): 15 mg via ORAL
  Filled 2013-04-23 (×2): qty 1

## 2013-04-23 MED ORDER — ASPIRIN EC 81 MG PO TBEC
81.0000 mg | DELAYED_RELEASE_TABLET | Freq: Every day | ORAL | Status: DC
  Filled 2013-04-23: qty 1

## 2013-04-23 MED ORDER — SODIUM CHLORIDE 0.9 % IJ SOLN: 3.0000 mL | Freq: Two times a day (BID) | INTRAMUSCULAR | Status: DC

## 2013-04-23 MED ORDER — ONDANSETRON HCL 4 MG/2ML IJ SOLN: 4.0000 mg | Freq: Four times a day (QID) | INTRAMUSCULAR | Status: DC | PRN

## 2013-04-23 MED ORDER — MORPHINE SULFATE 4 MG/ML IJ SOLN
4.0000 mg | Freq: Once | INTRAMUSCULAR | Status: AC
  Administered 2013-04-23: 4 mg via INTRAVENOUS
  Filled 2013-04-23: qty 1

## 2013-04-23 MED ORDER — ALUM & MAG HYDROXIDE-SIMETH 200-200-20 MG/5ML PO SUSP
30.0000 mL | Freq: Four times a day (QID) | ORAL | Status: DC | PRN
  Administered 2013-04-23: 30 mL via ORAL
  Filled 2013-04-23: qty 30

## 2013-04-23 MED ORDER — NITROGLYCERIN 0.4 MG SL SUBL: 0.4000 mg | SUBLINGUAL_TABLET | SUBLINGUAL | Status: DC | PRN

## 2013-04-23 MED ORDER — METOPROLOL TARTRATE 12.5 MG HALF TABLET
12.5000 mg | ORAL_TABLET | Freq: Two times a day (BID) | ORAL | Status: DC
  Administered 2013-04-23: 12.5 mg via ORAL
  Filled 2013-04-23 (×3): qty 1

## 2013-04-23 MED ORDER — SODIUM CHLORIDE 0.9 % IV SOLN
INTRAVENOUS | Status: DC
  Administered 2013-04-23: 17:00:00 via INTRAVENOUS

## 2013-04-23 MED ORDER — ACETAMINOPHEN 325 MG PO TABS: 650.0000 mg | ORAL_TABLET | Freq: Four times a day (QID) | ORAL | Status: DC | PRN

## 2013-04-23 MED ORDER — ASPIRIN 325 MG PO TABS
325.0000 mg | ORAL_TABLET | Freq: Once | ORAL | Status: AC
  Administered 2013-04-23: 325 mg via ORAL
  Filled 2013-04-23: qty 1

## 2013-04-23 MED ORDER — HEPARIN SODIUM (PORCINE) 5000 UNIT/ML IJ SOLN
5000.0000 [IU] | Freq: Three times a day (TID) | INTRAMUSCULAR | Status: DC
  Administered 2013-04-23 – 2013-04-24 (×2): 5000 [IU] via SUBCUTANEOUS
  Filled 2013-04-23 (×5): qty 1

## 2013-04-23 MED ORDER — ONDANSETRON HCL 4 MG PO TABS: 4.0000 mg | ORAL_TABLET | Freq: Four times a day (QID) | ORAL | Status: DC | PRN

## 2013-04-23 MED ORDER — MORPHINE SULFATE 2 MG/ML IJ SOLN
1.0000 mg | INTRAMUSCULAR | Status: DC | PRN
Start: 2013-04-23 — End: 2013-04-24
  Administered 2013-04-23 – 2013-04-24 (×2): 1 mg via INTRAVENOUS
  Filled 2013-04-23 (×2): qty 1

## 2013-04-23 MED ORDER — ACETAMINOPHEN 650 MG RE SUPP: 650.0000 mg | Freq: Four times a day (QID) | RECTAL | Status: DC | PRN

## 2013-04-23 MED ORDER — URELLE 81 MG PO TABS
1.0000 | ORAL_TABLET | Freq: Four times a day (QID) | ORAL | Status: DC | PRN
  Administered 2013-04-23: 81 mg via ORAL
  Filled 2013-04-23: qty 1

## 2013-04-23 MED ORDER — MORPHINE SULFATE ER 15 MG PO TBCR: 15.0000 mg | EXTENDED_RELEASE_TABLET | Freq: Three times a day (TID) | ORAL | Status: DC | PRN

## 2013-04-23 MED ORDER — PANTOPRAZOLE SODIUM 40 MG IV SOLR
40.0000 mg | Freq: Two times a day (BID) | INTRAVENOUS | Status: DC
  Administered 2013-04-23: 40 mg via INTRAVENOUS
  Filled 2013-04-23 (×3): qty 40

## 2013-04-23 MED ORDER — PHENAZOPYRIDINE HCL 100 MG PO TABS: 100.0000 mg | ORAL_TABLET | Freq: Three times a day (TID) | ORAL | Status: DC

## 2013-04-23 NOTE — ED Provider Notes (Signed)
CSN: [id number]     Arrival date & time 04/23/13  1320 History   First MD Initiated Contact with Patient 04/23/13 1335     Chief Complaint  Patient presents with  . Chest Pain   (Consider location/radiation/quality/duration/timing/severity/associated sxs/prior Treatment) The history is provided by the patient.  Rhonda [name] is a 80 y.o. female hx of HTN, asthma, MI x 2, schizophrenia here with chest pain. Chest pain started last night and sense of nausea. Last for several hours and radiated to the left arm. This morning the pain is worse. She took her MS Contin with minimal relief. Of note she was seen here in November and was admitted. Cardiology evaluate patient and patient refused a cath and troponin remained negative. She was thought to have multiple reasons for chest pain.    Past Medical History  Diagnosis Date  . Hypertension   . Anemia     a. H/o both iron def and B12 def.  . Arthritis   . Pneumonia   . Goiter   . Schizo affective schizophrenia   . Myocardial infarct     a. Hx of 4 MIs per pt with cardiologist in [place].  . Chronic back pain   . Ventral hernia   . Diverticular disease   . Esophageal disorder     a. esophageal stricture and esophagitis with dilatation in 1997..  . Chronic lung disease     a. Per records - prior nocturnal hypoxia documented, PFTs 2001 - Decreased vital capacity, decreased with neg PE w/u at that time.  . Stroke     a. Pt reports she "tried to have a stroke" after last MI <1 yr ago.  . CKD (chronic kidney disease)    Past Surgical History  Procedure Laterality Date  . Abdominal hysterectomy    . Back surgery    . Hernia repair    . Cholecystectomy    . Revision urostomy cutaneous     Family History  Problem Relation Age of Onset  . Alzheimer's disease Mother   . CAD Father    History  Substance Use Topics  . Smoking status: Never Smoker   . Smokeless tobacco: Never Used  . Alcohol Use: No   OB History   Grav Para Term  Preterm Abortions TAB SAB Ect Mult Living                 Review of Systems  Cardiovascular: Positive for chest pain.  All other systems reviewed and are negative.    Allergies  Macrodantin; Septra; and Soma  Home Medications    BP 116/71  Pulse 57  Temp(Src) 97.9 F (36.6 C)  Resp 17  Wt 101 lb (45.813 kg)  SpO2 98% Physical Exam  Nursing note and vitals reviewed. Constitutional: She is oriented to person, place, and time.  Pale, uncomfortable   HENT:  Head: Normocephalic.  Mouth/Throat: Oropharynx is clear and moist.  Eyes: Conjunctivae are normal. Pupils are equal, round, and reactive to light.  Neck: Normal range of motion. Neck supple.  Cardiovascular: Normal rate, regular rhythm and normal heart sounds.   Pulmonary/Chest: Effort normal and breath sounds normal. No respiratory distress. She has no wheezes. She has no rales. She exhibits no tenderness.  Abdominal: Soft. Bowel sounds are normal. She exhibits no distension. There is no tenderness. There is no rebound and no guarding.  Musculoskeletal: Normal range of motion. She exhibits no edema and no tenderness.  Neurological: She is alert and oriented  to person, place, and time. No cranial nerve deficit. Coordination normal.  Skin: Skin is warm and dry.  Psychiatric: She has a normal mood and affect. Her behavior is normal. Judgment and thought content normal.    ED Course  Procedures (including critical care time) Labs Review Labs Reviewed  CBC WITH DIFFERENTIAL - Abnormal; Notable for the following:    WBC 3.2 (*)    RBC 3.16 (*)    Hemoglobin 9.3 (*)    HCT 29.7 (*)    Platelets 122 (*)    All other components within normal limits  COMPREHENSIVE METABOLIC PANEL - Abnormal; Notable for the following:    Potassium 3.6 (*)    Chloride 113 (*)    CO2 15 (*)    Creatinine, Ser 1.85 (*)    Albumin 3.3 (*)    GFR calc non Af Amer 25 (*)    GFR calc Af Amer 29 (*)    All other components within normal  limits  POCT I-STAT TROPONIN I  TYPE AND SCREEN  ABO/RH   Imaging Review Dg Chest 2 View  04/23/2013   CLINICAL DATA:  Shortness of breath, chest pain  EXAM: CHEST  2 VIEW  COMPARISON:  02/15/2013  FINDINGS: The cardiac and mediastinal silhouettes are stable in size and contour, and remain within normal limits.  There is elevation of the right hemidiaphragm, similar to prior. Mild diffuse coarsening of the interstitial markings is similar to prior. No airspace consolidation, pleural effusion, or pulmonary edema is identified. There is no pneumothorax.  No acute osseous abnormality identified. Degenerative changes noted about the right shoulder. Cholecystectomy clips overlie the right upper quadrant.  IMPRESSION: Stable appearance of the chest with persistent mild elevation of the right hemidiaphragm. No acute cardiopulmonary process identified.   Electronically Signed   By: Rise Mu M.D.   On: 04/23/2013 14:55    EKG Interpretation    Date/Time:  Tuesday April 23 2013 13:31:31 EST Ventricular Rate:  59 PR Interval:  200 QRS Duration: 72 QT Interval:  426 QTC Calculation: 422 R Axis:   67 Text Interpretation:  Sinus rhythm Ventricular premature complex Consider left atrial enlargement Borderline repolarization abnormality TWI lateral leads that are new  Confirmed by Elliott Quade  MD, Saphyra Hutt [phone]) on 04/23/2013 1:48:13 PM            MDM  No diagnosis found. Rhonda [name] is a 80 y.o. female here with chest pain. High risk for ACS but she has been refusing cath in the past. Will get trop and likely need admission given risk factor for ACS.    3:05 PM  Trop neg x1. High risk for ACS. So will admit. Still doesn't want a cath so I will admit to tele under medicine.   Rhonda Canal, MD 04/23/13 [phone]

## 2013-04-23 NOTE — H&P (Signed)
[facility] History and Physical  Wille CelesteZana S Shughart ZOX:[id number] DOB: April 03, 1931 DOA: 04/23/2013  Referring physician: Dr Silverio LayYao PCP: [facility]  Chief Complaint:  Chest pain since one day  HPI:  80 year old female with multiple comorbidities including hypertension, anemia secondary to iron deficiency and B12 deficiency, arthritis, schizoaffective disorder, CAD with multiple MIs ( x 4 ), history of schizoaffective stricture and dilatation in the past, history of stroke, chronic kidney disease stage IV who presented to the ED with chest pain since yesterday. Patient reports having sharp substernal chest pain while going to bed last night and was 10/10 in intensity lasting for several minutes and occasionally radiating to her left arm. She reports having some nausea but no vomiting. Denies any shortness of breath, orthopnea or PND, no palpitations. She denies any headache, blurred vision, dizziness, fever, chills, bowel or urinary symptoms. Denies any chest trauma or recent travel. This morning she again had chest pain which was worse and took her MS Contin with minimal relief. She then came to the ED. She was admitted back in November of 2014 for chest pain symptoms and was ruled out for ACS. cardiology had evaluated the patient and recommended for cardiac cath but she refused. Patient has had multiple hospitalization for chest pain symptoms.  On questioning where her chest pain is located patient points to her epigastric area.  Course in the ED Patient's vitals were stable. Blood work for WBC of 2.2, hemoglobin 9.3, hematocrit of 29.7, platelets 122. Chemistry showed sodium of 143, potassium 3.6, chloride 113, CO2 15 with anion gap of 15. Creatinine of 1.85. Chest x-ray was unremarkable. EKG showed normal sinus rhythm with no ST-T changes. Initial troponin was negative.   [facility] called for admission under observation to telemetry.  Review of  Systems:  Constitutional: Denies fever, chills, diaphoresis, appetite change and fatigue.  HEENT: Denies ear pain, congestion, sore throat, rhinorrhea, sneezing, mouth sores, trouble swallowing, neck pain, neck stiffness and tinnitus.   Respiratory: Denies SOB, DOE, cough, chest tightness,  and wheezing.   Cardiovascular: chest pain, denies palpitations and leg swelling.  Gastrointestinal:  nausea, denies vomiting, abdominal pain, diarrhea, constipation, blood in stool and abdominal distention.  Genitourinary: Denies dysuria, urgency, frequency, hematuria, flank pain and difficulty urinating.  Endocrine: Denies polyuria, polydipsia. Musculoskeletal: Denies myalgias, back pain, joint swelling, arthralgias and gait problem.  Skin: Denies pallor, rash and wound.  Neurological: Denies dizziness, seizures, syncope, weakness, light-headedness, numbness and headaches.  Psychiatric/Behavioral: Denies suicidal ideation, mood changes, confusion, nervousness, sleep disturbance and agitation   Past Medical History  Diagnosis Date  . Hypertension   . Anemia     a. H/o both iron def and B12 def.  . Arthritis   . Pneumonia   . Goiter   . Schizo affective schizophrenia   . Myocardial infarct     a. Hx of 4 MIs per pt with cardiologist in [place].  . Chronic back pain   . Ventral hernia   . Diverticular disease   . Esophageal disorder     a. esophageal stricture and esophagitis with dilatation in 1997..  . Chronic lung disease     a. Per records - prior nocturnal hypoxia documented, PFTs 2001 - Decreased vital capacity, decreased with neg PE w/u at that time.  . Stroke     a. Pt reports she "tried to have a stroke" after last MI <1 yr ago.  . CKD (chronic kidney disease)    Past  Surgical History  Procedure Laterality Date  . Abdominal hysterectomy    . Back surgery    . Hernia repair    . Cholecystectomy    . Revision urostomy cutaneous     Social History:  reports that she has never  smoked. She has never used smokeless tobacco. She reports that she does not drink alcohol or use illicit drugs.  Allergies  Allergen Reactions  . Macrodantin [Nitrofurantoin Macrocrystal] Other (See Comments)    "Makes her fall"  . Septra [Sulfamethoxazole-Trimethoprim] Other (See Comments)    "Makes her fall"  . Soma [Carisoprodol] Other (See Comments)    "Makes her fall"    Family History  Problem Relation Age of Onset  . Alzheimer's disease Mother   . CAD Father     Prior to Admission medications   Medication Sig Start Date End Date Taking? Authorizing Provider  aspirin EC 81 MG EC tablet Take 1 tablet (81 mg total) by mouth daily. 02/16/13  Yes Jerald Kief, MD  morphine (MS CONTIN) 15 MG 12 hr tablet Take 1 tablet (15 mg total) by mouth every 8 (eight) hours. 01/12/13  Yes Rodolph Bong, MD  phenazopyridine (PYRIDIUM) 100 MG tablet Take 100 mg by mouth 3 (three) times daily as needed for pain.   Yes Historical Provider, MD  UNABLE TO FIND Med Name:  Morphine injection.   Yes Historical Provider, MD  cefdinir (OMNICEF) 300 MG capsule Take 300 mg by mouth 2 (two) times daily. 04/11/13   Historical Provider, MD    Physical Exam:  Filed Vitals:   04/23/13 1423 04/23/13 1500 04/23/13 1515 04/23/13 1539  BP:  139/64  137/62  Pulse: 57 55    Temp:      Resp: 17  21   Weight:      SpO2: 98% 97%      Elderly thin built female lying in bed in no acute distress HEENT: No pallor, no icterus, moist oral mucosa Chest: Clear to auscultation bilaterally, no added sounds CVS: Normal S1 and S2, no murmurs rub or gallop Abdomen: Soft, nondistended, colostomy bag in place, tender to palpation over epigastric area, bowel sounds present Extremities: Warm, no edema CNS: AAO x3  Labs on Admission:  Basic Metabolic Panel:  Recent Labs Lab 04/23/13 1350  NA 143  K 3.6*  CL 113*  CO2 15*  GLUCOSE 87  BUN 17  CREATININE 1.85*  CALCIUM 8.9   Liver Function Tests:  Recent  Labs Lab 04/23/13 1350  AST 14  ALT 7  ALKPHOS 74  BILITOT 0.4  PROT 6.2  ALBUMIN 3.3*   No results found for this basename: LIPASE, AMYLASE,  in the last 168 hours No results found for this basename: AMMONIA,  in the last 168 hours CBC:  Recent Labs Lab 04/23/13 1350  WBC 3.2*  NEUTROABS 1.9  HGB 9.3*  HCT 29.7*  MCV 94.0  PLT 122*   Cardiac Enzymes: No results found for this basename: CKTOTAL, CKMB, CKMBINDEX, TROPONINI,  in the last 168 hours BNP: No components found with this basename: POCBNP,  CBG: No results found for this basename: GLUCAP,  in the last 168 hours  Radiological Exams on Admission: Dg Chest 2 View  04/23/2013   CLINICAL DATA:  Shortness of breath, chest pain  EXAM: CHEST  2 VIEW  COMPARISON:  02/15/2013  FINDINGS: The cardiac and mediastinal silhouettes are stable in size and contour, and remain within normal limits.  There is elevation of the  right hemidiaphragm, similar to prior. Mild diffuse coarsening of the interstitial markings is similar to prior. No airspace consolidation, pleural effusion, or pulmonary edema is identified. There is no pneumothorax.  No acute osseous abnormality identified. Degenerative changes noted about the right shoulder. Cholecystectomy clips overlie the right upper quadrant.  IMPRESSION: Stable appearance of the chest with persistent mild elevation of the right hemidiaphragm. No acute cardiopulmonary process identified.   Electronically Signed   By: Rise Mu M.D.   On: 04/23/2013 14:55    EKG: NSR , TW flattening in  lateral leads, unchanged from previous,   Assessment/Plan  Principal Problem:   Chest pain She has significant underlying CAD however her symptoms appear quite atypical as her pain is more localized to the epigastric area. She does report having symptoms radiating down to her left arm yesterday.  Admit to tel under observation. Serial troponin x3 and EKG to r/o ACS. Patient had a 2-D echo in  November last year and I will not repeat it. -Patient was seen by cardiology during her last admission and recommended for cardiac cath however she refused. She couldn't he still refuses to undergo cardiac cath and wants to see if she truly has cardiac symptoms. -i will continue her on ASA, s/l nitrate and add low dose metoprolol. Check lipid panel.  i will place her on PPI BID. i strongly feel her symptoms are GI rather than cardiac in nature. Added maalox. She would need a GI evaluation as outpatient. ( Was recommended on prior hospitalization as well) -obtain cardiology consult if ACS ruled in. As she does not want any intervention will just need adjustment of mediations .   Active Problems:   Pancytopenia  No clear etiology. Pancytopenia noted on previous hospitalizations as well. Monitor for now.  CKD stage IV  creatinine at baseline.   History of delusional disorder and chronic pain Previously seen by psychiatry while in the hospital. She is not on any medications.  Mild metabolic acidosis  monitor on IV hydration     Diet: Cardiac DVT prophylaxis: SCDs  Code Status: full code Family Communication: son at bedside Disposition Plan: possibly home tomorrow  Eddie North [facility] Pager [phone]  If 7PM-7AM, please contact night-coverage [email or web address] Password [email or web address] 04/23/2013, 4:02 PM   Total time spent: 45 minutes

## 2013-04-23 NOTE — ED Notes (Signed)
Pt c/o chest pain started last night; continues this morning; worse; with associated nausea and left arm pain

## 2013-04-24 DIAGNOSIS — D696 Thrombocytopenia, unspecified: Secondary | ICD-10-CM

## 2013-04-24 DIAGNOSIS — R079 Chest pain, unspecified: Secondary | ICD-10-CM

## 2013-04-24 DIAGNOSIS — N184 Chronic kidney disease, stage 4 (severe): Secondary | ICD-10-CM

## 2013-04-24 DIAGNOSIS — R0789 Other chest pain: Secondary | ICD-10-CM

## 2013-04-24 DIAGNOSIS — D649 Anemia, unspecified: Secondary | ICD-10-CM

## 2013-04-24 LAB — BASIC METABOLIC PANEL
BUN: 18 mg/dL (ref 6–23)
CO2: 16 meq/L — AB (ref 19–32)
CREATININE: 1.78 mg/dL — AB (ref 0.50–1.10)
Calcium: 8.9 mg/dL (ref 8.4–10.5)
Chloride: 113 mEq/L — ABNORMAL HIGH (ref 96–112)
GFR calc Af Amer: 30 mL/min — ABNORMAL LOW (ref >90)
GFR calc non Af Amer: 26 mL/min — ABNORMAL LOW (ref >90)
GLUCOSE: 105 mg/dL — AB (ref 70–99)
Potassium: 3.5 mEq/L — ABNORMAL LOW (ref 3.7–5.3)
SODIUM: 142 meq/L (ref 137–147)

## 2013-04-24 LAB — TROPONIN I
Troponin I: <0.3 ng/mL (ref <0.30)
Troponin I: <0.3 ng/mL (ref <0.30)

## 2013-04-24 LAB — CBC
HCT: 29.3 % — ABNORMAL LOW (ref 36.0–46.0)
Hemoglobin: 9.3 g/dL — ABNORMAL LOW (ref 12.0–15.0)
MCH: 29.8 pg (ref 26.0–34.0)
MCHC: 31.7 g/dL (ref 30.0–36.0)
MCV: 93.9 fL (ref 78.0–100.0)
Platelets: 121 10*3/uL — ABNORMAL LOW (ref 150–400)
RBC: 3.12 MIL/uL — AB (ref 3.87–5.11)
RDW: 15.1 % (ref 11.5–15.5)
WBC: 4.1 10*3/uL (ref 4.0–10.5)

## 2013-04-24 LAB — LIPID PANEL
CHOL/HDL RATIO: 3.4 ratio
Cholesterol: 120 mg/dL (ref 0–200)
HDL: 35 mg/dL — AB (ref >39)
LDL CALC: 66 mg/dL (ref 0–99)
Triglycerides: 95 mg/dL (ref <150)
VLDL: 19 mg/dL (ref 0–40)

## 2013-04-24 MED ORDER — PANTOPRAZOLE SODIUM 40 MG PO TBEC: 40.0000 mg | DELAYED_RELEASE_TABLET | Freq: Every day | ORAL | Status: DC

## 2013-04-24 MED ORDER — HALOPERIDOL LACTATE 5 MG/ML IJ SOLN
2.0000 mg | Freq: Once | INTRAMUSCULAR | Status: AC
  Administered 2013-04-24: 2 mg via INTRAVENOUS
  Filled 2013-04-24: qty 1

## 2013-04-24 MED ORDER — METOPROLOL TARTRATE 12.5 MG HALF TABLET: 12.5000 mg | ORAL_TABLET | Freq: Two times a day (BID) | ORAL | Status: DC

## 2013-04-24 MED ORDER — UNABLE TO FIND: Status: DC

## 2013-04-24 MED ORDER — PANTOPRAZOLE SODIUM 40 MG PO TBEC: 40.0000 mg | DELAYED_RELEASE_TABLET | Freq: Every day | ORAL | Status: AC

## 2013-04-24 NOTE — Discharge Summary (Signed)
Physician Discharge Summary  KEALI MCCRAW ZOX:[id number] DOB: July 27, 1930 DOA: 04/23/2013  PCP: Carlyle Lipa, MD  Admit date: 04/23/2013 Discharge date: 04/24/2013  Recommendations for Outpatient Follow-up:  1. Pt will need to follow up with PCP in 2 weeks post discharge 2. Please obtain BMP to evaluate electrolytes and kidney function 3. Please also check CBC to evaluate Hg and Hct levels   Discharge Diagnoses:  Principal Problem:   Chest pain Active Problems:   Anemia   Acute on chronic renal insufficiency   Abdominal pain, unspecified site   Acidosis   HTN (hypertension)   Schizophrenia   CKD (chronic kidney disease) stage 4, GFR 15-29 ml/min   Pancytopenia   Chest pain at rest  atypical chest pain -Troponins negative x4 -02/12/2013 echocardiogram EF 65-70%, no wall motion abnormality -EKG with nonspecific T-wave changes, unchanged from previous EKGs -Chest x-ray negative for infiltrates -Patient continues to refuse any additional diagnostic testing with regard to her cardiac status--she refuses further stress testing or cardiac catheterization -Continue to treat medically -Continue metoprolol tartrate 12.5 mg twice a day -Continue aspirin 81 mg daily -Suspect atypical chest pain likely a component of musculoskeletal as well as GI etiology--chest pain is reproducible with palpation -recommend PPI daily at home CKD stage IV -Baseline creatinine 1.7-2.0 History of delusional disorder and chronic pain  Previously seen by psychiatry while in the hospital. She is not on any medications. -Stable -Continue home dose MS Contin Metabolic acidosis -Likely secondary to the patient's CKD -Stable Thrombocytopenia -Stable -Chronic -No signs of active bleed Discharge Condition: stable  Disposition:  home  Diet:cardiac Wt Readings from Last 3 Encounters:  04/23/13 45.5 kg (100 lb 5 oz)  02/20/13 47.083 kg (103 lb 12.8 oz)  02/12/13 48.716 kg (107 lb 6.4 oz)    History  of present illness:  80 year old female with multiple comorbidities including hypertension, anemia secondary to iron deficiency and B12 deficiency, arthritis, schizoaffective disorder, CAD with multiple MIs ( x 4 ), history of schizoaffective stricture and dilatation in the past, history of stroke, chronic kidney disease stage IV who presented to the ED with chest pain since yesterday. Patient reports having sharp substernal chest pain while going to bed last night and was 10/10 in intensity lasting for several minutes and occasionally radiating to her left arm. She reports having some nausea but no vomiting. Denies any shortness of breath, orthopnea or PND, no palpitations. She denies any headache, blurred vision, dizziness, fever, chills, bowel or urinary symptoms. Denies any chest trauma or recent travel. This morning she again had chest pain which was worse and took her MS Contin with minimal relief. She then came to the ED. She was admitted back in November of 2014 for chest pain symptoms and was ruled out for ACS. cardiology had evaluated the patient  In Nov 2014 and recommended for cardiac cath but she refused. Patient has had multiple hospitalization for chest pain symptoms.   Discharge Exam: Filed Vitals:   04/24/13 0556  BP: 134/62  Pulse: 57  Temp: 97.5 F (36.4 C)  Resp: 18   Filed Vitals:   04/23/13 1539 04/23/13 1612 04/23/13 2050 04/24/13 0556  BP: 137/62 118/67 145/54 134/62  Pulse:  55 69 57  Temp:  98.2 F (36.8 C) 98.3 F (36.8 C) 97.5 F (36.4 C)  TempSrc:  Oral Oral Oral  Resp:  20 18 18   Height:  5\' 5"  (1.651 m)    Weight:  45.5 kg (100 lb 5 oz)  SpO2:  98% 98% 98%   General: A&O x 3, NAD, pleasant, cooperative Cardiovascular: RRR, no rub, no gallop, no S3 Respiratory: CTAB, no wheeze, no rhonchi Abdomen:soft, mild epigastric tender without rebound, nondistended, positive bowel sounds Extremities: No edema, No lymphangitis, no petechiae  Discharge  Instructions      Discharge Orders   Future Orders Complete By Expires   Diet - low sodium heart healthy  As directed    Increase activity slowly  As directed        Medication List         aspirin 81 MG EC tablet  Take 1 tablet (81 mg total) by mouth daily.     cefdinir 300 MG capsule  Commonly known as:  OMNICEF  Take 300 mg by mouth 2 (two) times daily.     metoprolol tartrate 12.5 mg Tabs tablet  Commonly known as:  LOPRESSOR  Take 0.5 tablets (12.5 mg total) by mouth 2 (two) times daily.     morphine 15 MG 12 hr tablet  Commonly known as:  MS CONTIN  Take 1 tablet (15 mg total) by mouth every 8 (eight) hours.     pantoprazole 40 MG tablet  Commonly known as:  PROTONIX  Take 1 tablet (40 mg total) by mouth daily.     phenazopyridine 100 MG tablet  Commonly known as:  PYRIDIUM  Take 100 mg by mouth 3 (three) times daily as needed for pain.     UNABLE TO FIND  Med Name:  Morphine injection.  Pt unable to clarify.  Resume prior to admission dosing         The results of significant diagnostics from this hospitalization (including imaging, microbiology, ancillary and laboratory) are listed below for reference.    Significant Diagnostic Studies: Dg Chest 2 View  04/23/2013   CLINICAL DATA:  Shortness of breath, chest pain  EXAM: CHEST  2 VIEW  COMPARISON:  02/15/2013  FINDINGS: The cardiac and mediastinal silhouettes are stable in size and contour, and remain within normal limits.  There is elevation of the right hemidiaphragm, similar to prior. Mild diffuse coarsening of the interstitial markings is similar to prior. No airspace consolidation, pleural effusion, or pulmonary edema is identified. There is no pneumothorax.  No acute osseous abnormality identified. Degenerative changes noted about the right shoulder. Cholecystectomy clips overlie the right upper quadrant.  IMPRESSION: Stable appearance of the chest with persistent mild elevation of the right hemidiaphragm.  No acute cardiopulmonary process identified.   Electronically Signed   By: Rise MuBenjamin  McClintock M.D.   On: 04/23/2013 14:55     Microbiology: No results found for this or any previous visit (from the past 240 hour(s)).   Labs: Basic Metabolic Panel:  Recent Labs Lab 04/23/13 1350 04/24/13 0217  NA 143 142  K 3.6* 3.5*  CL 113* 113*  CO2 15* 16*  GLUCOSE 87 105*  BUN 17 18  CREATININE 1.85* 1.78*  CALCIUM 8.9 8.9   Liver Function Tests:  Recent Labs Lab 04/23/13 1350  AST 14  ALT 7  ALKPHOS 74  BILITOT 0.4  PROT 6.2  ALBUMIN 3.3*   No results found for this basename: LIPASE, AMYLASE,  in the last 168 hours No results found for this basename: AMMONIA,  in the last 168 hours CBC:  Recent Labs Lab 04/23/13 1350 04/24/13 0217  WBC 3.2* 4.1  NEUTROABS 1.9  --   HGB 9.3* 9.3*  HCT 29.7* 29.3*  MCV 94.0 93.9  PLT  122* 121*   Cardiac Enzymes:  Recent Labs Lab 04/23/13 1957 04/24/13 0218 04/24/13 0800  TROPONINI <0.30 <0.30 <0.30   BNP: No components found with this basename: POCBNP,  CBG: No results found for this basename: GLUCAP,  in the last 168 hours  Time coordinating discharge:  Greater than 30 minutes  Signed:  Akirah Storck, DO [facility] Pager: [phone] 04/24/2013, 10:13 AM

## 2013-04-24 NOTE — Plan of Care (Signed)
Problem: Phase I Progression Outcomes Goal: Voiding-avoid urinary catheter unless indicated Outcome: Not Applicable Date Met:  04/24/13 Patient has urostomy     

## 2013-04-27 ENCOUNTER — Encounter (HOSPITAL_COMMUNITY): Payer: Self-pay | Admitting: Emergency Medicine

## 2013-04-27 ENCOUNTER — Emergency Department (HOSPITAL_COMMUNITY): Payer: Medicare Other

## 2013-04-27 ENCOUNTER — Inpatient Hospital Stay (HOSPITAL_COMMUNITY)
Admission: EM | Admit: 2013-04-27 | Discharge: 2013-04-28 | DRG: 389 | Disposition: A | Discharge status: Other Acute Inpatient Hospital | Payer: Medicare Other | Attending: Surgery | Admitting: Surgery

## 2013-04-27 ENCOUNTER — Encounter (HOSPITAL_COMMUNITY): Payer: Medicare Other | Admitting: Anesthesiology

## 2013-04-27 ENCOUNTER — Encounter (HOSPITAL_COMMUNITY)
Admission: EM | Disposition: A | Discharge status: Other Acute Inpatient Hospital | Payer: Self-pay | Source: Home / Self Care

## 2013-04-27 ENCOUNTER — Emergency Department (HOSPITAL_COMMUNITY): Payer: Medicare Other | Admitting: Anesthesiology

## 2013-04-27 DIAGNOSIS — Z8551 Personal history of malignant neoplasm of bladder: Secondary | ICD-10-CM

## 2013-04-27 DIAGNOSIS — IMO0002 Reserved for concepts with insufficient information to code with codable children: Secondary | ICD-10-CM | POA: Diagnosis present

## 2013-04-27 DIAGNOSIS — K9419 Other complications of enterostomy: Secondary | ICD-10-CM | POA: Diagnosis present

## 2013-04-27 DIAGNOSIS — D72829 Elevated white blood cell count, unspecified: Secondary | ICD-10-CM | POA: Diagnosis present

## 2013-04-27 DIAGNOSIS — M129 Arthropathy, unspecified: Secondary | ICD-10-CM | POA: Diagnosis present

## 2013-04-27 DIAGNOSIS — G8929 Other chronic pain: Secondary | ICD-10-CM | POA: Diagnosis present

## 2013-04-27 DIAGNOSIS — Z906 Acquired absence of other parts of urinary tract: Secondary | ICD-10-CM

## 2013-04-27 DIAGNOSIS — E860 Dehydration: Secondary | ICD-10-CM | POA: Diagnosis present

## 2013-04-27 DIAGNOSIS — N189 Chronic kidney disease, unspecified: Secondary | ICD-10-CM | POA: Diagnosis present

## 2013-04-27 DIAGNOSIS — K56609 Unspecified intestinal obstruction, unspecified as to partial versus complete obstruction: Principal | ICD-10-CM | POA: Diagnosis present

## 2013-04-27 DIAGNOSIS — I252 Old myocardial infarction: Secondary | ICD-10-CM

## 2013-04-27 DIAGNOSIS — Z8249 Family history of ischemic heart disease and other diseases of the circulatory system: Secondary | ICD-10-CM

## 2013-04-27 DIAGNOSIS — Z538 Procedure and treatment not carried out for other reasons: Secondary | ICD-10-CM

## 2013-04-27 DIAGNOSIS — Z820 Family history of epilepsy and other diseases of the nervous system: Secondary | ICD-10-CM

## 2013-04-27 DIAGNOSIS — F259 Schizoaffective disorder, unspecified: Secondary | ICD-10-CM | POA: Diagnosis present

## 2013-04-27 DIAGNOSIS — K435 Parastomal hernia without obstruction or  gangrene: Secondary | ICD-10-CM | POA: Diagnosis present

## 2013-04-27 DIAGNOSIS — R112 Nausea with vomiting, unspecified: Secondary | ICD-10-CM | POA: Diagnosis present

## 2013-04-27 DIAGNOSIS — Z8673 Personal history of transient ischemic attack (TIA), and cerebral infarction without residual deficits: Secondary | ICD-10-CM

## 2013-04-27 DIAGNOSIS — R1084 Generalized abdominal pain: Secondary | ICD-10-CM | POA: Diagnosis present

## 2013-04-27 DIAGNOSIS — I129 Hypertensive chronic kidney disease with stage 1 through stage 4 chronic kidney disease, or unspecified chronic kidney disease: Secondary | ICD-10-CM | POA: Diagnosis present

## 2013-04-27 HISTORY — PX: LAPAROTOMY: SHX154

## 2013-04-27 LAB — CBC WITH DIFFERENTIAL/PLATELET
Basophils Absolute: 0 10*3/uL (ref 0.0–0.1)
Basophils Relative: 0 % (ref 0–1)
Eosinophils Absolute: 0 10*3/uL (ref 0.0–0.7)
Eosinophils Relative: 0 % (ref 0–5)
HCT: 40.1 % (ref 36.0–46.0)
Hemoglobin: 12.9 g/dL (ref 12.0–15.0)
LYMPHS ABS: 0.7 10*3/uL (ref 0.7–4.0)
LYMPHS PCT: 5 % — AB (ref 12–46)
MCH: 29.9 pg (ref 26.0–34.0)
MCHC: 32.2 g/dL (ref 30.0–36.0)
MCV: 93 fL (ref 78.0–100.0)
Monocytes Absolute: 1.4 10*3/uL — ABNORMAL HIGH (ref 0.1–1.0)
Monocytes Relative: 10 % (ref 3–12)
NEUTROS PCT: 85 % — AB (ref 43–77)
Neutro Abs: 12.4 10*3/uL — ABNORMAL HIGH (ref 1.7–7.7)
PLATELETS: 205 10*3/uL (ref 150–400)
RBC: 4.31 MIL/uL (ref 3.87–5.11)
RDW: 15.1 % (ref 11.5–15.5)
WBC: 14.5 10*3/uL — AB (ref 4.0–10.5)

## 2013-04-27 LAB — COMPREHENSIVE METABOLIC PANEL
ALT: 11 U/L (ref 0–35)
AST: 19 U/L (ref 0–37)
Albumin: 3.9 g/dL (ref 3.5–5.2)
Alkaline Phosphatase: 78 U/L (ref 39–117)
BUN: 16 mg/dL (ref 6–23)
CO2: 15 meq/L — AB (ref 19–32)
Calcium: 9.9 mg/dL (ref 8.4–10.5)
Chloride: 111 mEq/L (ref 96–112)
Creatinine, Ser: 1.83 mg/dL — ABNORMAL HIGH (ref 0.50–1.10)
GFR, EST AFRICAN AMERICAN: 29 mL/min — AB (ref >90)
GFR, EST NON AFRICAN AMERICAN: 25 mL/min — AB (ref >90)
GLUCOSE: 129 mg/dL — AB (ref 70–99)
POTASSIUM: 3.9 meq/L (ref 3.7–5.3)
Sodium: 143 mEq/L (ref 137–147)
TOTAL PROTEIN: 7.3 g/dL (ref 6.0–8.3)
Total Bilirubin: 0.8 mg/dL (ref 0.3–1.2)

## 2013-04-27 LAB — URINE MICROSCOPIC-ADD ON

## 2013-04-27 LAB — URINALYSIS, ROUTINE W REFLEX MICROSCOPIC
Bilirubin Urine: NEGATIVE
Glucose, UA: NEGATIVE mg/dL
Ketones, ur: NEGATIVE mg/dL
Nitrite: NEGATIVE
Protein, ur: >300 mg/dL — AB
SPECIFIC GRAVITY, URINE: 1.016 (ref 1.005–1.030)
UROBILINOGEN UA: 0.2 mg/dL (ref 0.0–1.0)
pH: 6.5 (ref 5.0–8.0)

## 2013-04-27 LAB — LIPASE, BLOOD: Lipase: 19 U/L (ref 11–59)

## 2013-04-27 LAB — CG4 I-STAT (LACTIC ACID): Lactic Acid, Venous: 1.08 mmol/L (ref 0.5–2.2)

## 2013-04-27 SURGERY — LAPAROTOMY, EXPLORATORY
Anesthesia: General

## 2013-04-27 MED ORDER — ERTAPENEM SODIUM 1 G IJ SOLR
1.0000 g | INTRAMUSCULAR | Status: DC | PRN
  Administered 2013-04-27: 1 g via INTRAVENOUS

## 2013-04-27 MED ORDER — MORPHINE SULFATE 4 MG/ML IJ SOLN
4.0000 mg | Freq: Once | INTRAMUSCULAR | Status: AC
  Administered 2013-04-27: 4 mg via INTRAVENOUS
  Filled 2013-04-27: qty 1

## 2013-04-27 MED ORDER — ENOXAPARIN SODIUM 30 MG/0.3ML ~~LOC~~ SOLN
30.0000 mg | Freq: Every day | SUBCUTANEOUS | Status: DC
  Administered 2013-04-27: 30 mg via SUBCUTANEOUS
  Filled 2013-04-27 (×2): qty 0.3

## 2013-04-27 MED ORDER — KCL IN DEXTROSE-NACL 20-5-0.45 MEQ/L-%-% IV SOLN: INTRAVENOUS | Status: DC

## 2013-04-27 MED ORDER — ONDANSETRON HCL 4 MG/2ML IJ SOLN
4.0000 mg | Freq: Once | INTRAMUSCULAR | Status: AC
  Administered 2013-04-27: 4 mg via INTRAVENOUS
  Filled 2013-04-27: qty 2

## 2013-04-27 MED ORDER — PROMETHAZINE HCL 25 MG/ML IJ SOLN: 6.2500 mg | INTRAMUSCULAR | Status: DC | PRN

## 2013-04-27 MED ORDER — HYDROMORPHONE HCL PF 2 MG/ML IJ SOLN
2.0000 mg | INTRAMUSCULAR | Status: DC | PRN
  Administered 2013-04-28 (×2): 2 mg via INTRAVENOUS
  Filled 2013-04-27 (×2): qty 1

## 2013-04-27 MED ORDER — FENTANYL CITRATE 0.05 MG/ML IJ SOLN
INTRAMUSCULAR | Status: AC
  Filled 2013-04-27: qty 2

## 2013-04-27 MED ORDER — PROPOFOL 10 MG/ML IV BOLUS
INTRAVENOUS | Status: DC | PRN
  Administered 2013-04-27: 5 mg via INTRAVENOUS

## 2013-04-27 MED ORDER — IOHEXOL 300 MG/ML  SOLN
50.0000 mL | Freq: Once | INTRAMUSCULAR | Status: AC | PRN
  Administered 2013-04-27: 50 mL via ORAL

## 2013-04-27 MED ORDER — LIDOCAINE HCL (CARDIAC) 20 MG/ML IV SOLN
INTRAVENOUS | Status: DC | PRN
  Administered 2013-04-27: 50 mg via INTRAVENOUS

## 2013-04-27 MED ORDER — FENTANYL CITRATE 0.05 MG/ML IJ SOLN
INTRAMUSCULAR | Status: DC | PRN
  Administered 2013-04-27: 50 ug via INTRAVENOUS
  Administered 2013-04-27 (×2): 25 ug via INTRAVENOUS

## 2013-04-27 MED ORDER — MORPHINE SULFATE 2 MG/ML IJ SOLN
1.0000 mg | INTRAMUSCULAR | Status: DC | PRN
  Administered 2013-04-27 (×2): 2 mg via INTRAVENOUS
  Filled 2013-04-27 (×2): qty 1

## 2013-04-27 MED ORDER — FENTANYL CITRATE 0.05 MG/ML IJ SOLN: 25.0000 ug | INTRAMUSCULAR | Status: DC | PRN

## 2013-04-27 MED ORDER — MEPERIDINE HCL 50 MG/ML IJ SOLN: 6.2500 mg | INTRAMUSCULAR | Status: DC | PRN

## 2013-04-27 MED ORDER — LACTATED RINGERS IV SOLN
INTRAVENOUS | Status: DC | PRN
  Administered 2013-04-27: 18:00:00 via INTRAVENOUS

## 2013-04-27 MED ORDER — SODIUM CHLORIDE 0.9 % IV BOLUS (SEPSIS)
1000.0000 mL | Freq: Once | INTRAVENOUS | Status: AC
  Administered 2013-04-27: 1000 mL via INTRAVENOUS

## 2013-04-27 MED ORDER — PROPOFOL 10 MG/ML IV BOLUS
INTRAVENOUS | Status: AC
  Filled 2013-04-27: qty 20

## 2013-04-27 MED ORDER — SODIUM BICARBONATE 8.4 % IV SOLN: INTRAVENOUS | Status: DC

## 2013-04-27 MED ORDER — ETOMIDATE 2 MG/ML IV SOLN
INTRAVENOUS | Status: DC | PRN
  Administered 2013-04-27: 10 mg via INTRAVENOUS

## 2013-04-27 MED ORDER — ACETAMINOPHEN 325 MG PO TABS: 650.0000 mg | ORAL_TABLET | Freq: Four times a day (QID) | ORAL | Status: DC | PRN

## 2013-04-27 MED ORDER — ONDANSETRON HCL 4 MG/2ML IJ SOLN
4.0000 mg | Freq: Four times a day (QID) | INTRAMUSCULAR | Status: DC | PRN
Start: 2013-04-27 — End: 2013-04-28
  Administered 2013-04-27: 4 mg via INTRAVENOUS
  Filled 2013-04-27: qty 2

## 2013-04-27 MED ORDER — SUCCINYLCHOLINE CHLORIDE 20 MG/ML IJ SOLN
INTRAMUSCULAR | Status: DC | PRN
  Administered 2013-04-27: 100 mg via INTRAVENOUS

## 2013-04-27 MED ORDER — FENTANYL CITRATE 0.05 MG/ML IJ SOLN
25.0000 ug | INTRAMUSCULAR | Status: DC | PRN
  Administered 2013-04-27 (×2): 25 ug via INTRAVENOUS

## 2013-04-27 MED ORDER — SODIUM CHLORIDE 0.9 % IV SOLN
INTRAVENOUS | Status: AC
  Filled 2013-04-27: qty 1

## 2013-04-27 MED ORDER — PANTOPRAZOLE SODIUM 40 MG IV SOLR
40.0000 mg | Freq: Every day | INTRAVENOUS | Status: DC
  Administered 2013-04-27: 40 mg via INTRAVENOUS
  Filled 2013-04-27 (×2): qty 40

## 2013-04-27 MED ORDER — ACETAMINOPHEN 650 MG RE SUPP: 650.0000 mg | Freq: Four times a day (QID) | RECTAL | Status: DC | PRN

## 2013-04-27 MED ORDER — SODIUM BICARBONATE 8.4 % IV SOLN
INTRAVENOUS | Status: DC
  Administered 2013-04-27 – 2013-04-28 (×2): via INTRAVENOUS
  Filled 2013-04-27 (×4): qty 1000

## 2013-04-27 SURGICAL SUPPLY — 35 items
APPLICATOR COTTON TIP 6IN STRL (MISCELLANEOUS) ×3 IMPLANT
BLADE EXTENDED COATED 6.5IN (ELECTRODE) IMPLANT
BLADE HEX COATED 2.75 (ELECTRODE) ×3 IMPLANT
CANISTER SUCTION 2500CC (MISCELLANEOUS) ×3 IMPLANT
COVER MAYO STAND STRL (DRAPES) IMPLANT
DRAPE LAPAROSCOPIC ABDOMINAL (DRAPES) ×3 IMPLANT
DRAPE WARM FLUID 44X44 (DRAPE) IMPLANT
ELECT REM PT RETURN 9FT ADLT (ELECTROSURGICAL) ×3
ELECTRODE REM PT RTRN 9FT ADLT (ELECTROSURGICAL) ×1 IMPLANT
GLOVE BIOGEL PI IND STRL 7.0 (GLOVE) ×1 IMPLANT
GLOVE BIOGEL PI INDICATOR 7.0 (GLOVE) ×2
GLOVE SURG ORTHO 8.0 STRL STRW (GLOVE) ×3 IMPLANT
GOWN STRL REUS W/TWL LRG LVL3 (GOWN DISPOSABLE) ×3 IMPLANT
GOWN STRL REUS W/TWL XL LVL3 (GOWN DISPOSABLE) ×6 IMPLANT
KIT BASIN OR (CUSTOM PROCEDURE TRAY) ×3 IMPLANT
NS IRRIG 1000ML POUR BTL (IV SOLUTION) ×3 IMPLANT
PACK GENERAL/GYN (CUSTOM PROCEDURE TRAY) ×3 IMPLANT
SPONGE GAUZE 4X4 12PLY (GAUZE/BANDAGES/DRESSINGS) ×3 IMPLANT
SPONGE LAP 18X18 X RAY DECT (DISPOSABLE) IMPLANT
STAPLER VISISTAT 35W (STAPLE) ×3 IMPLANT
SUCTION POOLE TIP (SUCTIONS) IMPLANT
SUT NOV 1 T60/GS (SUTURE) IMPLANT
SUT SILK 2 0 (SUTURE)
SUT SILK 2 0 SH CR/8 (SUTURE) IMPLANT
SUT SILK 2-0 18XBRD TIE 12 (SUTURE) IMPLANT
SUT SILK 3 0 (SUTURE)
SUT SILK 3 0 SH CR/8 (SUTURE) IMPLANT
SUT SILK 3-0 18XBRD TIE 12 (SUTURE) IMPLANT
SUT VICRYL 2 0 18  UND BR (SUTURE)
SUT VICRYL 2 0 18 UND BR (SUTURE) IMPLANT
SYSTEM UROSTOMY GENTLE TOUCH (WOUND CARE) ×3 IMPLANT
TOWEL OR 17X26 10 PK STRL BLUE (TOWEL DISPOSABLE) ×6 IMPLANT
TRAY FOLEY CATH 14FRSI W/METER (CATHETERS) ×3 IMPLANT
WATER STERILE IRR 1500ML POUR (IV SOLUTION) ×3 IMPLANT
YANKAUER SUCT BULB TIP NO VENT (SUCTIONS) IMPLANT

## 2013-04-27 NOTE — Op Note (Signed)
General Surgery [facility]  This patient was evaluated in the emergency department at the request of the emergency room physician. The patient's son, Rhonda [name], was at the bedside during her evaluation and during the process of obtaining informed consent. I explained to both the patient and her son that she had a complicated urgent surgical problem with a parastomal hernia causing small bowel obstruction with possible ischemia or infarction. I recommended exploratory laparotomy with repair of the parastomal hernia and possible need for small bowel resection. Patient understood the proposed procedure and signed informed consent.  Patient was brought to the operating room. After preparation by the anesthesia team, she was taken to operating room #1 and general anesthesia was induced. At this point in time the patient's son came into the operating theater and demanded that we not to operate on his mother at this time. He stated that if we operated on her he was sure she would die. He demanded that we stop the procedure and that they would seek a second opinion at another institution. He threatened to sue the medical team and the [facility] if we proceeded.  With the anesthesiologist in attendance of the patient, I went into the hallway and spoke with the patient's son at length. I again explained the proposed procedure and the reasons for proceeding in an expeditious fashion. He expressed understanding but again demanded that we not proceed with surgery. I asked him to be seated in the waiting room while I contacted hospital administration and legal counsel.  I contacted the administrator on-call, Mr. Elwanda Brooklynoel Burt, and discussed the case with him in detail. He then contacted the health systems attorney and again reviewed the case. Given that the patient is her own legal decision maker and signed a valid informed consent, the attorney stated that we could proceed with the procedure.   However, it was my opinion that the patient was not in immediate risk of death. I felt the patient could safely be awakened from anesthesia and transferred to the intensive care unit. During her time under general anesthesia, the relaxation of the abdominal musculature allowed for at least partial reduction of her peristomal hernia. I made the decision not to proceed with surgery at this time. I contacted the intensive care unit and the critical care physician on duty and discussed the case and asked for their assistance with her care.  I then went back to speak with the patient's son, Mr. Rhonda SabinBilly Preiss, and reviewed my decision-making with him. He was strongly in favor of not proceeding with surgery at this time. I expressed to him that I did not think his mother was in any immediate danger but would require operative intervention to correct the parastomal hernia and bowel obstruction. He expressed that they would like to obtain a second opinion and would like to [facility] when it was safe to do so. I told him that provided his mother remained stable in the intensive care unit overnight tonight, we would attempt to facilitate transfer tomorrow, 04/28/2013. He expressed appreciation and was in agreement with this plan. I also communicated this plan to the administrator on-call, Mr. Elwanda Brooklynoel Burt, and through him with the hospital legal counsel.  Admission orders were completed for the intensive care unit. The patient was stable upon transfer from the operating room to the intensive care unit.  Velora Hecklerodd M. Malachi Kinzler, MD, [facility] Office: [phone]

## 2013-04-27 NOTE — Preoperative (Signed)
Beta Blockers   Reason not to administer Beta Blockers:Has not taken metoprolol in 2 days due to Nausea and vomiting. Will give beta blocker intraoperatively if  vitals stabilize

## 2013-04-27 NOTE — ED Notes (Signed)
Patient belongings in locker 26 in ER.

## 2013-04-27 NOTE — ED Provider Notes (Addendum)
CSN: [id number]     Arrival date & time 04/27/13  1119 History   First MD Initiated Contact with Patient 04/27/13 1146     Chief Complaint  Patient presents with  . Vomiting  . Abdominal Pain   (Consider location/radiation/quality/duration/timing/severity/associated sxs/prior Treatment) Patient is a 80 y.o. female presenting with abdominal pain. The history is provided by the patient.  Abdominal Pain Pain location:  Generalized Pain quality: aching, gnawing and sharp   Pain radiates to:  Does not radiate Pain severity:  Severe Onset quality:  Gradual Duration: 1.5. Timing:  Constant Progression:  Worsening Chronicity:  New Relieved by:  Nothing Worsened by:  Vomiting and eating Ineffective treatments:  None tried Associated symptoms: anorexia, chills, diarrhea, nausea and vomiting   Associated symptoms: no dysuria, no fever, no hematemesis and no shortness of breath   Risk factors: aspirin   Risk factors: no alcohol abuse, no NSAID use and no recent hospitalization     Past Medical History  Diagnosis Date  . Hypertension   . Anemia     a. H/o both iron def and B12 def.  . Arthritis   . Pneumonia   . Goiter   . Schizo affective schizophrenia   . Myocardial infarct     a. Hx of 4 MIs per pt with cardiologist in [place].  . Chronic back pain   . Ventral hernia   . Diverticular disease   . Esophageal disorder     a. esophageal stricture and esophagitis with dilatation in 1997..  . Chronic lung disease     a. Per records - prior nocturnal hypoxia documented, PFTs 2001 - Decreased vital capacity, decreased with neg PE w/u at that time.  . Stroke     a. Pt reports she "tried to have a stroke" after last MI <1 yr ago.  . CKD (chronic kidney disease)    Past Surgical History  Procedure Laterality Date  . Abdominal hysterectomy    . Back surgery    . Hernia repair    . Cholecystectomy    . Revision urostomy cutaneous     Family History  Problem Relation Age of  Onset  . Alzheimer's disease Mother   . CAD Father    History  Substance Use Topics  . Smoking status: Never Smoker   . Smokeless tobacco: Never Used  . Alcohol Use: No   OB History   Grav Para Term Preterm Abortions TAB SAB Ect Mult Living                 Review of Systems  Constitutional: Positive for chills. Negative for fever.  Respiratory: Negative for shortness of breath.   Gastrointestinal: Positive for nausea, vomiting, abdominal pain, diarrhea and anorexia. Negative for hematemesis.  Genitourinary: Negative for dysuria.  All other systems reviewed and are negative.    Allergies  Macrodantin; Septra; and Soma  Home Medications   Current Outpatient Rx  Name  Route  Sig  Dispense  Refill  . aspirin EC 81 MG EC tablet   Oral   Take 1 tablet (81 mg total) by mouth daily.         Marland Kitchen morphine (MS CONTIN) 15 MG 12 hr tablet   Oral   Take 1 tablet (15 mg total) by mouth every 8 (eight) hours.   30 tablet   0   . phenazopyridine (PYRIDIUM) 100 MG tablet   Oral   Take 100 mg by mouth 3 (three) times daily as needed  for pain.         Marland Kitchen. UNABLE TO FIND      Med Name:  Morphine injection.  Pt unable to clarify.  Resume prior to admission dosing         . cefdinir (OMNICEF) 300 MG capsule   Oral   Take 300 mg by mouth 2 (two) times daily.         . metoprolol tartrate (LOPRESSOR) 12.5 mg TABS tablet   Oral   Take 12.5 mg by mouth 2 (two) times daily.         . pantoprazole (PROTONIX) 40 MG tablet   Oral   Take 1 tablet (40 mg total) by mouth daily.   30 tablet   0    BP 154/68  Pulse 81  Temp(Src) 97.5 F (36.4 C) (Oral)  Resp 14  SpO2 98% Physical Exam  Nursing note and vitals reviewed. Constitutional: She is oriented to person, place, and time. She appears well-developed and well-nourished. She appears distressed.  HENT:  Head: Normocephalic and atraumatic.  Mouth/Throat: Oropharynx is clear and moist. Mucous membranes are dry.  Eyes:  Conjunctivae and EOM are normal. Pupils are equal, round, and reactive to light.  Neck: Normal range of motion. Neck supple.  Cardiovascular: Normal rate, regular rhythm and intact distal pulses.   No murmur heard. Pulmonary/Chest: Effort normal and breath sounds normal. No respiratory distress. She has no wheezes. She has no rales.  Abdominal: Soft. She exhibits no distension. There is generalized tenderness. There is guarding. There is no rebound and no CVA tenderness.    Musculoskeletal: Normal range of motion. She exhibits no edema and no tenderness.  Neurological: She is alert and oriented to person, place, and time.  Skin: Skin is warm and dry. No rash noted. No erythema. There is pallor.  Psychiatric: She has a normal mood and affect. Her behavior is normal.    ED Course  Procedures (including critical care time) Labs Review Labs Reviewed  CBC WITH DIFFERENTIAL - Abnormal; Notable for the following:    WBC 14.5 (*)    Neutrophils Relative % 85 (*)    Neutro Abs 12.4 (*)    Lymphocytes Relative 5 (*)    Monocytes Absolute 1.4 (*)    All other components within normal limits  COMPREHENSIVE METABOLIC PANEL - Abnormal; Notable for the following:    CO2 15 (*)    Glucose, Bld 129 (*)    Creatinine, Ser 1.83 (*)    GFR calc non Af Amer 25 (*)    GFR calc Af Amer 29 (*)    All other components within normal limits  LIPASE, BLOOD  URINALYSIS, ROUTINE W REFLEX MICROSCOPIC  CG4 I-STAT (LACTIC ACID)   Imaging Review Ct Abdomen Pelvis Wo Contrast  04/27/2013   CLINICAL DATA:  Pain.  Nausea and vomiting  EXAM: CT ABDOMEN AND PELVIS WITHOUT CONTRAST  TECHNIQUE: Multidetector CT imaging of the abdomen and pelvis was performed following the standard protocol without IV contrast.  COMPARISON:  CT ABD/PELV WO CM dated 02/13/2013; CT ABD/PELV WO CM dated 09/19/2012  FINDINGS: Stomach and proximal small bowel are markedly dilated. Air-fluid levels are present. Distal small bowel is  decompressed. Colon is decompressed. A left peristomal hernia for an ileal conduit is present containing small bowel loops. There is wall thickening of some of the bowel loops which are dilated. There is fluid density within the hernia sac and stranding within the adjacent fat, all likely due to inflammatory changes.  This is the likely transition point for the area of bowel obstruction.  There is free fluid around the liver.  Postcholecystectomy.  Liver and spleen are unremarkable  Adrenal glands are within normal limits  Kidneys are atrophic and lobulated. Tiny calcifications in the lower pole of the right kidney.  A fluid collection in the anterior pelvis on image 62 is likely related to the ileal conduit. The bladder is surgically absent.  No evidence of free intraperitoneal gas. No loculated abscess collection. Patchy densities at the dependent right lung base compatible with atelectasis versus airspace disease.  Advanced degenerative disc disease at L2-3. No vertebral compression deformity.  IMPRESSION: Small bowel obstruction secondary to a left lower quadrant parastomal hernia for the ileal conduit. Small bowel loops are inflamed and there is fluid density is well as fatty stranding within the hernia sac. Ischemic compromise cannot be excluded.   Electronically Signed   By: Maryclare Bean M.D.   On: 04/27/2013 15:42    EKG Interpretation    Date/Time:  Saturday April 27 2013 16:39:26 EST Ventricular Rate:  79 PR Interval:  179 QRS Duration: 73 QT Interval:  379 QTC Calculation: 434 R Axis:   38 Text Interpretation:  Sinus rhythm Consider left atrial enlargement Abnormal R-wave progression, early transition Abnormal T, consider ischemia, lateral leads No significant change since last tracing Confirmed by Anitra Lauth  MD, Raynelle Fujikawa ([id number]) on 04/27/2013 4:47:42 PM            MDM   1. SBO (small bowel obstruction)   2. Parastomal hernia of ileal conduit     Patient presents from home with again  a half of diffuse abdominal pain, vomiting and some mild loose stool. On exam she appears pale and severely dehydrated. She is retching on exam. Abdomen has some diffuse pain around her ileostomy there is a firm mass that appears to be more tender feels to be a hernia the rest of the abdomen is soft. There is urine in her ileostomy bag.  Concern for infectious etiology versus obstruction at this time. Patient denies either hematemesis or melena. Lactate is within normal limits. CBC with a leukocytosis of 15,000 and stable hemoglobin of 12.9. CMP, lipase pending. Patient given IV fluids, pain medicine and nausea medication. Currently blood pressure is stable  1:47 PM Leukocytosis today of 14,500. CMP with chronic renal insufficiency but unchanged from prior at 1.8. Lipase within normal limits and lactate within normal limits. Will get CT of the abdomen and pelvis with oral contrast only for further evaluation.  4:04 PM CT showed SBO from LLQ parastomal hernia.  Will discuss with surgery.  Gwyneth Sprout, MD 04/27/13 1604  Gwyneth Sprout, MD 04/27/13 1621  Gwyneth Sprout, MD 04/27/13 1610

## 2013-04-27 NOTE — Anesthesia Preprocedure Evaluation (Addendum)
Anesthesia Evaluation  Patient identified by MRN, date of birth, ID band Patient awake    Reviewed: Allergy & Precautions, H&P , Patient's Chart, lab work & pertinent test results, reviewed documented beta blocker date and time   History of Anesthesia Complications Negative for: history of anesthetic complications  Airway Mallampati: II TM Distance: >3 FB Neck ROM: full    Dental   Pulmonary pneumonia -,  breath sounds clear to auscultation        Cardiovascular Exercise Tolerance: Good hypertension, + Past MI Rhythm:regular Rate:Normal     Neuro/Psych PSYCHIATRIC DISORDERS CVA    GI/Hepatic   Endo/Other    Renal/GU CRFRenal disease     Musculoskeletal   Abdominal   Peds  Hematology  (+) anemia ,   Anesthesia Other Findings EKG shows LVH and obliteration of cavity and EF of 80%.  Otherwise no valvular pathology mentioned  Reproductive/Obstetrics                         Anesthesia Physical Anesthesia Plan  ASA: IV and emergent  Anesthesia Plan: General ETT   Post-op Pain Management:    Induction:   Airway Management Planned:   Additional Equipment:   Intra-op Plan:   Post-operative Plan:   Informed Consent: I have reviewed the patients History and Physical, chart, labs and discussed the procedure including the risks, benefits and alternatives for the proposed anesthesia with the patient or authorized representative who has indicated his/her understanding and acceptance.   Dental Advisory Given  Plan Discussed with: CRNA and Surgeon  Anesthesia Plan Comments:       will place second IV and a-line if patient shows hemodynamic instability  Anesthesia Quick Evaluation

## 2013-04-27 NOTE — Progress Notes (Signed)
Pt with multiple emesis episode since arrival to unit. She was administered Zofran 4mg  IV. Walked into room to find patient vomiting gold color fluid. Small blood tinge color assessed as well. Pt is very weak, arousable, and oriented x 4. SpO2 100% on 2L O2 [place].

## 2013-04-27 NOTE — Progress Notes (Signed)
Patient's son requested surgery be stopped after patient was asleep under anesthesia.  MD talked with son and hospital administration and decision was made to awaken patient from anesthesia. ICU bed obtained. Patient taken to PACU.

## 2013-04-27 NOTE — Transfer of Care (Signed)
Immediate Anesthesia Transfer of Care Note  Patient: Rhonda [name]  Procedure(s) Performed: Procedure(s): EXPLORATORY LAPAROTOMY (N/A)  Patient Location: PACU  Anesthesia Type:General  Level of Consciousness: awake and alert   Airway & Oxygen Therapy: Patient Spontanous Breathing and Patient connected to face mask oxygen  Post-op Assessment: Report given to PACU RN and Post -op Vital signs reviewed and stable  Post vital signs: Reviewed and stable  Complications: No apparent anesthesia complications

## 2013-04-27 NOTE — Brief Op Note (Signed)
04/27/2013  8:17 PM  PATIENT:  Rhonda [name]  80 y.o. female  PRE-OPERATIVE DIAGNOSIS:  Parastomal hernia, small bowel obstruction, possible intestinal ischemia or infarction  POST-OPERATIVE DIAGNOSIS:  * No post-op diagnosis entered *  PROCEDURE:  Cancelled at family's request (son - Burnetta SabinBilly Ahuja)  SURGEON:  Surgeon(s) and Role:    * Velora Hecklerodd M Shaquetta Arcos, MD - Primary  ANESTHESIA:   general  EBL:  Total I/O In: 1300 [I.V.:1300] Out: -   BLOOD ADMINISTERED:none  DRAINS: none   LOCAL MEDICATIONS USED:  NONE  SPECIMEN:  No Specimen  DISPOSITION OF SPECIMEN:  N/A  COUNTS:  YES  TOURNIQUET:  * No tourniquets in log *  DICTATION: .Dragon Dictation  PLAN OF CARE: Admit to inpatient   PATIENT DISPOSITION:  ICU - extubated and stable.   Delay start of Pharmacological VTE agent (>24hrs) due to surgical blood loss or risk of bleeding: no  Velora Hecklerodd M. Auna Mikkelsen, MD, [facility] Office: [phone]

## 2013-04-27 NOTE — Consult Note (Addendum)
Name: JESSAMINE BARCIA MRN: [id number] DOB: [birth year]    LOS: 0  Referring Provider:  Dr. Gerrit Friends Reason for Referral:  Medical Management of bowel obstruction  PULMONARY / CRITICAL CARE MEDICINE  HPI:  Ms. Lansdale is an 80 y/o woman with the past medical history detailed below.  Relevant to this admission is that she has previously had an an ileal conduit and currently has incarcerated bowel at the site of the conduit.  She was initially consented and taken to the OR for emergent ex-lap and hernia reduction however the surgery was interrupted after anesthesia induction by her son who was requesting a second opinion and transfer to [facility].  It was felt that delaying the surgery would not risk Ms. Longan's life, therefore, after discussion with hospital administration and legal counsel, Ms. Berhow was awoken from anesthesia and transferred to the ICU. Upon my arrival she was complaining of uncontrolled abdominal pain as well as having emesis.  She was able to speak in full sentences and was asking for additional pain medication.  She also reported that she had not been able to sleep for the last 10 days because of pain.   Past Medical History  Diagnosis Date  . Hypertension   . Anemia     a. H/o both iron def and B12 def.  . Arthritis   . Pneumonia   . Goiter   . Schizo affective schizophrenia   . Myocardial infarct     a. Hx of 4 MIs per pt with cardiologist in [place].  . Chronic back pain   . Ventral hernia   . Diverticular disease   . Esophageal disorder     a. esophageal stricture and esophagitis with dilatation in 1997..  . Chronic lung disease     a. Per records - prior nocturnal hypoxia documented, PFTs 2001 - Decreased vital capacity, decreased with neg PE w/u at that time.  . Stroke     a. Pt reports she "tried to have a stroke" after last MI <1 yr ago.  . CKD (chronic kidney disease)    Past Surgical History  Procedure Laterality Date  .  Abdominal hysterectomy    . Back surgery    . Hernia repair    . Cholecystectomy    . Revision urostomy cutaneous     Prior to Admission medications   Medication Sig Start Date End Date Taking? Authorizing Provider  aspirin EC 81 MG EC tablet Take 1 tablet (81 mg total) by mouth daily. 02/16/13  Yes Jerald Kief, MD  morphine (MS CONTIN) 15 MG 12 hr tablet Take 1 tablet (15 mg total) by mouth every 8 (eight) hours. 01/12/13  Yes Rodolph Bong, MD  phenazopyridine (PYRIDIUM) 100 MG tablet Take 100 mg by mouth 3 (three) times daily as needed for pain.   Yes Historical Provider, MD  UNABLE TO FIND Med Name:  Morphine injection.  Pt unable to clarify.  Resume prior to admission dosing 04/24/13  Yes Catarina Hartshorn, MD  cefdinir (OMNICEF) 300 MG capsule Take 300 mg by mouth 2 (two) times daily. 04/11/13   Historical Provider, MD  metoprolol tartrate (LOPRESSOR) 12.5 mg TABS tablet Take 12.5 mg by mouth 2 (two) times daily. 04/24/13   Catarina Hartshorn, MD  pantoprazole (PROTONIX) 40 MG tablet Take 1 tablet (40 mg total) by mouth daily. 04/24/13   Catarina Hartshorn, MD   Allergies Allergies  Allergen Reactions  . Macrodantin [Nitrofurantoin Macrocrystal] Other (See Comments)    "  Makes her fall"  . Septra [Sulfamethoxazole-Trimethoprim] Other (See Comments)    "Makes her fall"  . Soma [Carisoprodol] Other (See Comments)    "Makes her fall"    Family History Family History  Problem Relation Age of Onset  . Alzheimer's disease Mother   . CAD Father    Social History  reports that she has never smoked. She has never used smokeless tobacco. She reports that she does not drink alcohol or use illicit drugs.  Review Of Systems:  A review of 14 systems was negative except as stated in the HPI.   Brief patient description:  80 y/o with MMP and incarcerated hernia who refused surgery.  Medical management for now, awaiting on transfer.   Events Since Admission: Underwent anesthesia and awoken from anesthesia  prior to procedure: 04/27/2012  Current Status:  Vital Signs: Temp:  [97.5 F (36.4 C)-98.3 F (36.8 C)] 98.3 F (36.8 C) (01/24 2120) Pulse Rate:  [81-97] 97 (01/24 2120) Resp:  [13-22] 19 (01/24 2120) BP: (147-187)/(68-93) 187/71 mmHg (01/24 2120) SpO2:  [98 %-100 %] 100 % (01/24 2100)  Physical Examination: General:  Appears stated age, lying in bed on her side, complaining of severe abdominal pain. Neuro:  Alert and oriented x3. HEENT:  Pupils 2mm, ERRL, EOMI Neck:  Supple, no JVD, no masses Cardiovascular:  NRRR, no MRG Lungs:  CTAB, slightly diminished throughout Abdomen:  Absent bowel sounds, tender throughout Musculoskeletal:  No joint deformities Skin:  No bruising. No rashes or other lesions.   Principal Problem:   Small bowel obstruction Active Problems:   Parastomal hernia of ileal conduit   ASSESSMENT AND PLAN  PULMONARY No results found for this basename: PHART, PCO2, PCO2ART, PO2ART, HCO3, O2SAT,  in the last 168 hours Ventilator Settings:   A:  History of chronic lung disease, nodules seen on previous CT P:   Will need to be evaluated after acute problems addressed.  CARDIOVASCULAR  Recent Labs Lab 04/23/13 1957 04/24/13 0218 04/24/13 0800 04/27/13 1255  TROPONINI <0.30 <0.30 <0.30  --   LATICACIDVEN  --   --   --  1.08   ECG:  NSR Lines: PIV  A: h/o hypertension and previous MI per pt history  P:  Hold home metoprolol for now.   RENAL  Recent Labs Lab 04/23/13 1350 04/24/13 0217 04/27/13 1250  NA 143 142 143  K 3.6* 3.5* 3.9  CL 113* 113* 111  CO2 15* 16* 15*  BUN 17 18 16   CREATININE 1.85* 1.78* 1.83*  CALCIUM 8.9 8.9 9.9   Intake/Output     01/24 0701 - 01/25 0700   I.V. 1700   Total Intake 1700   Net +1700        Foley:  1/24  A:  CKD and chronic metabolic acidosis secondary to ileal conduit P:   -Seen by renal during previous hospitalization.  Was to be started on oral bicarb and follow up with them as an outpt.   I cannot see that this has happened. -I have changed her IV fluids from saline based to d5 with 173mEq/L sodium bicarb and 48mEq/L potassium -Will need to start oral bicarb when able to take PO  GASTROINTESTINAL  Recent Labs Lab 04/23/13 1350 04/27/13 1250  AST 14 19  ALT 7 11  ALKPHOS 74 78  BILITOT 0.4 0.8  PROT 6.2 7.3  ALBUMIN 3.3* 3.9    A:  Bowel obstruction P:   Management per surgery Pain not controlled with 8mg  IV  morphine, I have changed this to dilaudid and will titrate as necessary.   HEMATOLOGIC  Recent Labs Lab 04/23/13 1350 04/24/13 0217 04/27/13 1250  HGB 9.3* 9.3* 12.9  HCT 29.7* 29.3* 40.1  PLT 122* 121* 205   A:  Anemia P:  Likely due to chronic kidney disease  I ENDOCRINE No results found for this basename: GLUCAP,  in the last 168 hours A:  At risk for hyperglycemia   P:   SSI    BEST PRACTICE / DISPOSITION Level of Care:  ICU Primary Service:  Gen Surg Consultants:  PCCM Code Status:  Full Diet:  NPO DVT Px:  lovenox GI Px:  protonix Skin Integrity:  good Social / Family:  Son at bedside and updated.   I spent 35 minutes of critical care time in the care of this patient seperate from procedures which are documented elsewhere   Belinda BlockGIDDINGS, Charlann LangeLIVIA K., M.D. Pulmonary and Critical Care Medicine [facility] Pager: [phone]  04/27/2013, 10:08 PM

## 2013-04-27 NOTE — ED Notes (Signed)
Patient states that she is having abdominal cramping and N/V/D since she ate a questionable gravy biscuit yesterday. Moaning in pain

## 2013-04-27 NOTE — Anesthesia Postprocedure Evaluation (Signed)
Anesthesia Post Note  Patient: Rhonda [name]  Procedure(s) Performed: Procedure(s) (LRB): EXPLORATORY LAPAROTOMY (N/A)  Anesthesia type: General  Patient location: PACU  Post pain: Pain level controlled  Post assessment: Post-op Vital signs reviewed  Last Vitals:  Filed Vitals:   04/27/13 1627  BP: 147/69  Pulse: 81  Temp:   Resp:     Post vital signs: Reviewed  Level of consciousness: sedated  Complications: No apparent anesthesia complications. Case was cancelled after induction but prior to incision.

## 2013-04-27 NOTE — H&P (Signed)
Rhonda [name] is an 80 y.o. female.    General Surgery [facility]  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 Patient had undergone cystectomy for bladder cancer in 1988. Patient has presented previously with parastomal hernia. She was evaluated by my partner, Dr. Erroll Luna, in June of 2014. He recommended referral to a tertiary care center for repair due to the large size of the abdominal wall defect.  The patient became ill yesterday. She developed abdominal pain followed by nausea and vomiting. She believes she has food poisoning. Evaluation in the emergency department included a CT scan of the abdomen and pelvis which shows a large peristomal hernia with incarcerated small bowel with obstruction and possible ischemia. General surgery is now consulted for urgent intervention.   Past Medical History  Diagnosis Date  . Hypertension   . Anemia     a. H/o both iron def and B12 def.  . Arthritis   . Pneumonia   . Goiter   . Schizo affective schizophrenia   . Myocardial infarct     a. Hx of 4 MIs per pt with cardiologist in [place].  . Chronic back pain   . Ventral hernia   . Diverticular disease   . Esophageal disorder     a. esophageal stricture and esophagitis with dilatation in 1997..  . Chronic lung disease     a. Per records - prior nocturnal hypoxia documented, PFTs 2001 - Decreased vital capacity, decreased with neg PE w/u at that time.  . Stroke     a. Pt reports she "tried to have a stroke" after last MI <1 yr ago.  . CKD (chronic kidney disease)     Past Surgical History  Procedure Laterality Date  . Abdominal hysterectomy    . Back surgery    . Hernia repair    . Cholecystectomy    . Revision urostomy cutaneous      Family History  Problem Relation Age of Onset  . Alzheimer's disease Mother   . CAD Father     Social History:  reports that she has never smoked. She has never used smokeless tobacco. She reports that she does not drink alcohol or use illicit drugs.  Allergies:  Allergies  Allergen Reactions  . Macrodantin [Nitrofurantoin Macrocrystal] Other (See Comments)    "Makes her fall"  . Septra [Sulfamethoxazole-Trimethoprim] Other (See Comments)    "Makes her fall"  . Soma [Carisoprodol] Other (See Comments)    "Makes her fall"     (Not in a hospital admission)  Results for orders placed during the hospital encounter of 04/27/13 (from the past 48 hour(s))  CBC WITH DIFFERENTIAL     Status: Abnormal   Collection Time    04/27/13 12:50 PM      Result Value Range   WBC 14.5 (*) 4.0 - 10.5 K/uL   RBC 4.31  3.87 - 5.11 MIL/uL   Hemoglobin 12.9  12.0 - 15.0 g/dL   HCT 40.1  36.0 - 46.0 %   MCV 93.0  78.0 - 100.0 fL   MCH 29.9  26.0 - 34.0 pg   MCHC 32.2  30.0 - 36.0 g/dL   RDW 15.1  11.5 - 15.5 %   Platelets 205  150 - 400 K/uL   Neutrophils Relative % 85 (*) 43 - 77 %   Neutro Abs  12.4 (*) 1.7 - 7.7 K/uL   Lymphocytes Relative 5 (*) 12 - 46 %   Lymphs Abs 0.7  0.7 - 4.0 K/uL   Monocytes Relative 10  3 - 12 %   Monocytes Absolute 1.4 (*) 0.1 - 1.0 K/uL   Eosinophils Relative 0  0 - 5 %   Eosinophils Absolute 0.0  0.0 - 0.7 K/uL   Basophils Relative 0  0 - 1 %   Basophils Absolute 0.0  0.0 - 0.1 K/uL  COMPREHENSIVE METABOLIC PANEL     Status: Abnormal   Collection Time    04/27/13 12:50 PM      Result Value Range   Sodium 143  137 - 147 mEq/L   Potassium 3.9  3.7 - 5.3 mEq/L   Chloride 111  96 - 112 mEq/L   CO2 15 (*) 19 - 32 mEq/L   Glucose, Bld 129 (*) 70 - 99 mg/dL   BUN 16  6 - 23 mg/dL   Creatinine, Ser 1.83 (*) 0.50 - 1.10 mg/dL   Calcium 9.9  8.4 - 10.5 mg/dL   Total Protein 7.3  6.0 - 8.3 g/dL   Albumin 3.9  3.5 - 5.2 g/dL   AST 19  0 - 37 U/L   ALT 11  0 - 35 U/L   Alkaline Phosphatase 78  39 - 117 U/L   Total Bilirubin 0.8  0.3 - 1.2 mg/dL   GFR calc  non Af Amer 25 (*) >90 mL/min   GFR calc Af Amer 29 (*) >90 mL/min   Comment: (NOTE)     The eGFR has been calculated using the CKD EPI equation.     This calculation has not been validated in all clinical situations.     eGFR's persistently <90 mL/min signify possible Chronic Kidney     Disease.  LIPASE, BLOOD     Status: None   Collection Time    04/27/13 12:50 PM      Result Value Range   Lipase 19  11 - 59 U/L  CG4 I-STAT (LACTIC ACID)     Status: None   Collection Time    04/27/13 12:55 PM      Result Value Range   Lactic Acid, Venous 1.08  0.5 - 2.2 mmol/L  URINALYSIS, ROUTINE W REFLEX MICROSCOPIC     Status: Abnormal   Collection Time    04/27/13  4:25 PM      Result Value Range   Color, Urine YELLOW  YELLOW   APPearance CLOUDY (*) CLEAR   Specific Gravity, Urine 1.016  1.005 - 1.030   pH 6.5  5.0 - 8.0   Glucose, UA NEGATIVE  NEGATIVE mg/dL   Hgb urine dipstick SMALL (*) NEGATIVE   Bilirubin Urine NEGATIVE  NEGATIVE   Ketones, ur NEGATIVE  NEGATIVE mg/dL   Protein, ur >300 (*) NEGATIVE mg/dL   Urobilinogen, UA 0.2  0.0 - 1.0 mg/dL   Nitrite NEGATIVE  NEGATIVE   Leukocytes, UA SMALL (*) NEGATIVE  URINE MICROSCOPIC-ADD ON     Status: Abnormal   Collection Time    04/27/13  4:25 PM      Result Value Range   WBC, UA 11-20  <3 WBC/hpf   RBC / HPF 0-2  <3 RBC/hpf   Bacteria, UA MANY (*) RARE   Ct Abdomen Pelvis Wo Contrast  04/27/2013   CLINICAL DATA:  Pain.  Nausea and vomiting  EXAM: CT ABDOMEN AND PELVIS WITHOUT CONTRAST  TECHNIQUE: Multidetector CT  imaging of the abdomen and pelvis was performed following the standard protocol without IV contrast.  COMPARISON:  CT ABD/PELV WO CM dated 02/13/2013; CT ABD/PELV WO CM dated 09/19/2012  FINDINGS: Stomach and proximal small bowel are markedly dilated. Air-fluid levels are present. Distal small bowel is decompressed. Colon is decompressed. A left peristomal hernia for an ileal conduit is present containing small bowel loops.  There is wall thickening of some of the bowel loops which are dilated. There is fluid density within the hernia sac and stranding within the adjacent fat, all likely due to inflammatory changes. This is the likely transition point for the area of bowel obstruction.  There is free fluid around the liver.  Postcholecystectomy.  Liver and spleen are unremarkable  Adrenal glands are within normal limits  Kidneys are atrophic and lobulated. Tiny calcifications in the lower pole of the right kidney.  A fluid collection in the anterior pelvis on image 62 is likely related to the ileal conduit. The bladder is surgically absent.  No evidence of free intraperitoneal gas. No loculated abscess collection. Patchy densities at the dependent right lung base compatible with atelectasis versus airspace disease.  Advanced degenerative disc disease at L2-3. No vertebral compression deformity.  IMPRESSION: Small bowel obstruction secondary to a left lower quadrant parastomal hernia for the ileal conduit. Small bowel loops are inflamed and there is fluid density is well as fatty stranding within the hernia sac. Ischemic compromise cannot be excluded.   Electronically Signed   By: Maryclare Bean M.D.   On: 04/27/2013 15:42    Review of Systems  Constitutional: Positive for malaise/fatigue.  HENT: Negative.   Eyes: Negative.   Respiratory: Negative.   Cardiovascular: Positive for chest pain.  Gastrointestinal: Positive for nausea, vomiting and abdominal pain.  Genitourinary:       Hx of cystectomy  Musculoskeletal: Negative.   Skin: Negative.   Neurological: Positive for weakness.  Endo/Heme/Allergies: Negative.   Psychiatric/Behavioral:       Schizophrenia    Blood pressure 147/69, pulse 81, temperature 97.5 F (36.4 C), temperature source Oral, resp. rate 14, SpO2 98.00%. Physical Exam  Constitutional: She is oriented to person, place, and time. She appears distressed.  Cachectic, weak  HENT:  Head: Normocephalic  and atraumatic.  Right Ear: External ear normal.  Left Ear: External ear normal.  Eyes: Conjunctivae are normal. Pupils are equal, round, and reactive to light. No scleral icterus.  Neck: Normal range of motion. Neck supple. No tracheal deviation present. No thyromegaly present.  Cardiovascular: Normal rate, regular rhythm and normal heart sounds.   No murmur heard. Respiratory: Effort normal and breath sounds normal. No respiratory distress. She has no wheezes.  GI: Soft. There is tenderness. There is guarding.  Large parastomal hernia in left abdominal wall surrounding ileal conduit; markedly tender; fluid wave in hernia sac; not reducible  Musculoskeletal: Normal range of motion. She exhibits no edema.  Neurological: She is alert and oriented to person, place, and time.  Skin: Skin is warm and dry.  Psychiatric: She has a normal mood and affect. Her behavior is normal.     Assessment/Plan Parastomal hernia with incarcerated small bowel, obstruction, and possible ischemia  Plan exploratory laparotomy with repair of parastomal hernia, possible bowel resection  I discussed the indications for urgent abdominal exploration and reduction of her parastomal hernia. I explained that there may be ischemic bowel and that it was possible she would require bowel resection. I also discussed the possibility that her ileal  conduit would need to be revised and that we would consult urology if this was necessary. Patient is very concerned about undergoing surgery. She is concerned about her cardiac status. She is concerned about possible revision of her ileal conduit. Unfortunately, she has a high-grade obstruction and possible ischemia of the involved small bowel. This is a surgical emergency which I've explained to the patient and to her family at the bedside. They understand and agree to proceed with the operation.  I will notify the operating room and the surgical team and prepare for operative intervention  later this afternoon.  The risks and benefits of the procedure have been discussed at length with the patient.  The patient understands the proposed procedure, potential alternative treatments, and the course of recovery to be expected.  All of the patient's questions have been answered at this time.  The patient wishes to proceed with surgery.  Earnstine Regal, MD, [facility] Office: [place] 04/27/2013, 5:08 PM

## 2013-04-28 ENCOUNTER — Inpatient Hospital Stay (HOSPITAL_COMMUNITY): Payer: Medicare Other

## 2013-04-28 LAB — CBC WITH DIFFERENTIAL/PLATELET
Basophils Absolute: 0 10*3/uL (ref 0.0–0.1)
Basophils Relative: 0 % (ref 0–1)
EOS PCT: 0 % (ref 0–5)
Eosinophils Absolute: 0 10*3/uL (ref 0.0–0.7)
HEMATOCRIT: 35.5 % — AB (ref 36.0–46.0)
Hemoglobin: 11.2 g/dL — ABNORMAL LOW (ref 12.0–15.0)
LYMPHS ABS: 1.3 10*3/uL (ref 0.7–4.0)
Lymphocytes Relative: 8 % — ABNORMAL LOW (ref 12–46)
MCH: 29.5 pg (ref 26.0–34.0)
MCHC: 31.5 g/dL (ref 30.0–36.0)
MCV: 93.4 fL (ref 78.0–100.0)
Monocytes Absolute: 1 10*3/uL (ref 0.1–1.0)
Monocytes Relative: 7 % (ref 3–12)
Neutro Abs: 12.7 10*3/uL — ABNORMAL HIGH (ref 1.7–7.7)
Neutrophils Relative %: 85 % — ABNORMAL HIGH (ref 43–77)
PLATELETS: 182 10*3/uL (ref 150–400)
RBC: 3.8 MIL/uL — AB (ref 3.87–5.11)
RDW: 15.3 % (ref 11.5–15.5)
WBC: 14.9 10*3/uL — AB (ref 4.0–10.5)

## 2013-04-28 LAB — BASIC METABOLIC PANEL
BUN: 18 mg/dL (ref 6–23)
CALCIUM: 8.9 mg/dL (ref 8.4–10.5)
CO2: 18 meq/L — AB (ref 19–32)
CREATININE: 1.67 mg/dL — AB (ref 0.50–1.10)
Chloride: 111 mEq/L (ref 96–112)
GFR calc Af Amer: 32 mL/min — ABNORMAL LOW (ref >90)
GFR, EST NON AFRICAN AMERICAN: 28 mL/min — AB (ref >90)
GLUCOSE: 133 mg/dL — AB (ref 70–99)
Potassium: 3.8 mEq/L (ref 3.7–5.3)
Sodium: 142 mEq/L (ref 137–147)

## 2013-04-28 LAB — MRSA PCR SCREENING: MRSA by PCR: NEGATIVE

## 2013-04-28 LAB — CK TOTAL AND CKMB (NOT AT ARMC)
CK TOTAL: 24 U/L (ref 7–177)
CK, MB: 2.8 ng/mL (ref 0.3–4.0)
CK, MB: 1.8 ng/mL (ref 0.3–4.0)
Relative Index: INVALID (ref 0.0–2.5)
Relative Index: INVALID (ref 0.0–2.5)
Total CK: 35 U/L (ref 7–177)

## 2013-04-28 LAB — TYPE AND SCREEN
ABO/RH(D): O POS
Antibody Screen: POSITIVE
DAT, IgG: NEGATIVE

## 2013-04-28 LAB — TROPONIN I: Troponin I: <0.3 ng/mL (ref <0.30)

## 2013-04-28 LAB — LACTIC ACID, PLASMA: Lactic Acid, Venous: 1.6 mmol/L (ref 0.5–2.2)

## 2013-04-28 MED ORDER — PROMETHAZINE HCL 25 MG/ML IJ SOLN
12.5000 mg | Freq: Four times a day (QID) | INTRAMUSCULAR | Status: DC | PRN
  Administered 2013-04-28: 12.5 mg via INTRAVENOUS
  Filled 2013-04-28 (×2): qty 1

## 2013-04-28 MED ORDER — ASPIRIN 300 MG RE SUPP
300.0000 mg | Freq: Every day | RECTAL | Status: DC
  Administered 2013-04-28: 300 mg via RECTAL
  Filled 2013-04-28: qty 1

## 2013-04-28 MED ORDER — METOPROLOL TARTRATE 1 MG/ML IV SOLN
2.5000 mg | Freq: Three times a day (TID) | INTRAVENOUS | Status: DC
  Administered 2013-04-28: 2.5 mg via INTRAVENOUS
  Filled 2013-04-28 (×4): qty 5

## 2013-04-28 MED ORDER — SODIUM CHLORIDE 0.9 % IV BOLUS (SEPSIS): 500.0000 mL | Freq: Once | INTRAVENOUS | Status: DC

## 2013-04-28 NOTE — Progress Notes (Signed)
Notified Dr. Maisie Fushomas of patient IV infiltrating with redness and swelling noted to site.Cold compress applied per IV team recommendation.

## 2013-04-28 NOTE — Progress Notes (Signed)
eLink Physician-Brief Progress Note Patient Name: Rhonda [name] DOB: 14-May-1930 MRN: [id number]  Date of Service  04/28/2013   HPI/Events of Note  Call from nurse regarding EKG changes suggestive of AMI.  Patient is s/p aborted surgery for bowel obstruction.  Apparently seen by cardiology in past and had refused CC.  Patient is currently HD stable with HR in the 100s, BP of 146/64 and RR of 15 with adequate O2 sats.  No CP but does have some N/V felt to be related to ongoing GI issues.   eICU Interventions  Plan: Serial enzymes - if positive start heparin gtt ASA BB 2D echo Cardiology consult in AM - sooner if clinical condition changes.   Intervention Category Intermediate Interventions: Arrhythmia - evaluation and management  DETERDING,ELIZABETH 04/28/2013, 1:35 AM

## 2013-04-28 NOTE — Progress Notes (Signed)
Report called Kathlene NovemberMike, RN at [facility]. Patient being admitted to room C 818.

## 2013-04-28 NOTE — Progress Notes (Signed)
Dr. Gerrit FriendsGerkin notified patients son Genevie CheshireBilly [phone]) of acceptance of bed [facility].

## 2013-04-28 NOTE — Progress Notes (Signed)
Patient ID: Rhonda [name], female   DOB: May 24, 1930, 80 y.o.   MRN: [id number]  General Surgery - [facility] - Progress Note  HD# 2  Subjective: Patient in ICU.  Awake and responsive.  Nauseated with emesis (bilious). Discussed cancellation of surgery last night at her son's request.  She agrees with not proceeding with surgery.  She desires transfer to [facility] for second opinion and treatment.  Objective: Vital signs in last 24 hours: Temp:  [97.5 F (36.4 C)-101.1 F (38.4 C)] 98.2 F (36.8 C) (01/25 0645) Pulse Rate:  [81-107] 88 (01/25 0730) Resp:  [11-22] 19 (01/25 0730) BP: (84-187)/(45-93) 120/46 mmHg (01/25 0730) SpO2:  [97 %-100 %] 100 % (01/25 0730) Weight:  [107 lb 2.3 oz (48.6 kg)] 107 lb 2.3 oz (48.6 kg) (01/25 0400)    Intake/Output from previous day: 01/24 0701 - 01/25 0700 In: 1700 [I.V.:1700] Out: 480 [Urine:400; Emesis/NG output:80]  Exam: HEENT - clear, not icteric Neck - soft Chest - clear bilaterally Cor - RRR 80's Abd - soft, BS present; parastomal hernia present, soft, minimally tender; stoma viable Ext - no significant edema Neuro - grossly intact, no focal deficits  Lab Results:   Recent Labs  04/27/13 1250 04/28/13 0130  WBC 14.5* 14.9*  HGB 12.9 11.2*  HCT 40.1 35.5*  PLT 205 182     Recent Labs  04/27/13 1250 04/28/13 0130  NA 143 142  K 3.9 3.8  CL 111 111  CO2 15* 18*  GLUCOSE 129* 133*  BUN 16 18  CREATININE 1.83* 1.67*  CALCIUM 9.9 8.9    Studies/Results: Ct Abdomen Pelvis Wo Contrast  04/27/2013   CLINICAL DATA:  Pain.  Nausea and vomiting  EXAM: CT ABDOMEN AND PELVIS WITHOUT CONTRAST  TECHNIQUE: Multidetector CT imaging of the abdomen and pelvis was performed following the standard protocol without IV contrast.  COMPARISON:  CT ABD/PELV WO CM dated 02/13/2013; CT ABD/PELV WO CM dated 09/19/2012  FINDINGS: Stomach and proximal small bowel are markedly dilated. Air-fluid levels are present. Distal small  bowel is decompressed. Colon is decompressed. A left peristomal hernia for an ileal conduit is present containing small bowel loops. There is wall thickening of some of the bowel loops which are dilated. There is fluid density within the hernia sac and stranding within the adjacent fat, all likely due to inflammatory changes. This is the likely transition point for the area of bowel obstruction.  There is free fluid around the liver.  Postcholecystectomy.  Liver and spleen are unremarkable  Adrenal glands are within normal limits  Kidneys are atrophic and lobulated. Tiny calcifications in the lower pole of the right kidney.  A fluid collection in the anterior pelvis on image 62 is likely related to the ileal conduit. The bladder is surgically absent.  No evidence of free intraperitoneal gas. No loculated abscess collection. Patchy densities at the dependent right lung base compatible with atelectasis versus airspace disease.  Advanced degenerative disc disease at L2-3. No vertebral compression deformity.  IMPRESSION: Small bowel obstruction secondary to a left lower quadrant parastomal hernia for the ileal conduit. Small bowel loops are inflamed and there is fluid density is well as fatty stranding within the hernia sac. Ischemic compromise cannot be excluded.   Electronically Signed   By: Maryclare BeanArt  Hoss M.D.   On: 04/27/2013 15:42   Dg Abd Portable 1v  04/28/2013   CLINICAL DATA:  Small bowel obstruction  EXAM: PORTABLE ABDOMEN - 1 VIEW  COMPARISON:  CT on 04/27/2013  FINDINGS: Moderately dilated small bowel loops show no significant change. Persistent paucity of colonic gas is again demonstrated. Multiple surgical clips again seen within the abdomen and pelvis.  IMPRESSION: Findings consistent with small bowel obstruction. No significant change compared to recent CT.   Electronically Signed   By: Myles Rosenthal M.D.   On: 04/28/2013 07:19    Assessment / Plan: Parastomal hernia, complex, with incarcerated small  bowel with obstruction  Procedure cancelled last night at family request - see op note  Patient agrees with cancellation of procedure and desires transfer to [facility] for second opinion and surgical management - has been patient there before for urologic procedures  Persistent nausea and emesis - unable to pass NG tube in ER and in OR under anesthesia - will request NG placement under fluoro by radiology today  Continue to rule out MI - initial enzymes negative - cardiology to evaluate this AM  Appreciate CCM assistance with care in ICU  Will contact [facility] today for transfer  Velora Heckler, MD, [facility] Office: [phone]  04/28/2013

## 2013-04-28 NOTE — Progress Notes (Signed)
(  late entry from 0200) pt with rhythm changes on heart monitor. EKG shows ACUTE MI  And inferior infarct. Dr Sid FalconGerkins aware and  CCM notified of acute changes. Will follow interventions as ordered per MD.

## 2013-04-28 NOTE — Progress Notes (Signed)
General Surgery [facility]  At patient and family (son - Burnetta SabinBilly Warsame) request, I contacted [facility] to [facility].  I discussed the case with Dr. Ewell PoeNathan Mowery on the surgical service.  He has accepted the patient in transfer and will arrange for transport to their ICU.  Patient and family notified.  Velora Hecklerodd M. Jermiya Reichl, MD, [facility] Office: [phone]

## 2013-04-29 NOTE — Progress Notes (Signed)
PACU note: fentanyl wasted in sink . Pt. Was discharged prior to waste.

## 2013-04-30 LAB — URINE CULTURE

## 2013-05-03 ENCOUNTER — Encounter (HOSPITAL_COMMUNITY): Payer: Self-pay | Admitting: Surgery

## 2013-05-05 DEATH — deceased

## 2013-05-13 ENCOUNTER — Encounter (HOSPITAL_COMMUNITY): Payer: Self-pay | Admitting: Surgery

## 2013-05-13 NOTE — Discharge Summary (Signed)
Physician Discharge Summary [facility]  Patient ID: Rhonda [name] MRN: [id number] DOB/AGE: 11/02/1930 80 y.o.  Admit date: 04/27/2013 Discharge date: 05/13/2013  Admission Diagnoses:  Incarcerated parastomal hernia with small bowel obstruction, possible ischemia  Discharge Diagnoses:  Principal Problem:   Small bowel obstruction Active Problems:   Parastomal hernia of ileal conduit   Discharged Condition: stable  Hospital Course: patient seen and evaluated in the emergency department. Please see admission note. Based on clinical examination and CT scan findings, decision made to proceed with urgent surgical intervention.  This patient was evaluated in the emergency department at the request of the emergency room physician. The patient's son, Burnetta SabinBilly Barich, was at the bedside during her evaluation and during the process of obtaining informed consent. I explained to both the patient and her son that she had a complicated urgent surgical problem with a parastomal hernia causing small bowel obstruction with possible ischemia or infarction. I recommended exploratory laparotomy with repair of the parastomal hernia and possible need for small bowel resection. Patient understood the proposed procedure and signed informed consent.   Patient was brought to the operating room. After preparation by the anesthesia team, she was taken to operating room #1 and general anesthesia was induced. At this point in time the patient's son came into the operating theater and demanded that we not to operate on his mother at this time. He stated that if we operated on her he was sure she would die. He demanded that we stop the procedure and that they would seek a second opinion at another institution. He threatened to sue the medical team and the [facility] if we proceeded.   With the anesthesiologist in attendance of the patient, I went into the hallway and spoke with the patient's son at  length. I again explained the proposed procedure and the reasons for proceeding in an expeditious fashion. He expressed understanding but again demanded that we not proceed with surgery. I asked him to be seated in the waiting room while I contacted hospital administration and legal counsel.   I contacted the administrator on-call, Mr. Elwanda Brooklynoel Burt, and discussed the case with him in detail. He then contacted the health systems attorney and again reviewed the case. Given that the patient is her own legal decision maker and signed a valid informed consent, the attorney stated that we could proceed with the procedure. However, it was my opinion that the patient was not in immediate risk of death. I felt the patient could safely be awakened from anesthesia and transferred to the intensive care unit. During her time under general anesthesia, the relaxation of the abdominal musculature allowed for at least partial reduction of her peristomal hernia. I made the decision not to proceed with surgery at this time. I contacted the intensive care unit and the critical care physician on duty and discussed the case and asked for their assistance with her care. I then went back to speak with the patient's son, Mr. Burnetta SabinBilly Bruss, and reviewed my decision-making with him. He was strongly in favor of not proceeding with surgery at this time. I expressed to him that I did not think his mother was in any immediate danger but would require operative intervention to correct the parastomal hernia and bowel obstruction. He expressed that they would like to obtain a second opinion and would like to [facility] when it was safe to do so. I told him that  provided his mother remained stable in the intensive care unit overnight tonight, we would attempt to facilitate transfer tomorrow, 04/28/2013. He expressed appreciation and was in agreement with this plan. I also communicated this plan to the  administrator on-call, Mr. Elwanda Brooklyn, and through him with the hospital legal counsel.   Admission orders were completed for the intensive care unit. The patient was stable upon transfer from the operating room to the intensive care unit.  Patient was monitored overnight in the intensive care unit. She was seen in consultation by critical care medicine. On the morning following admission, I discussed the events of her hospital stay with the patient. I also contacted her son by telephone. He again requested transfer to [facility]. I presented this option to the patient and she agreed with transfer.  I contacted [facility] and spoke directly with Dr. Ewell Poe from the general surgery service. Arrangements were made for transfer to the intensive care unit at their facility.   Consults: pulmonary/intensive care  Significant Diagnostic Studies: CT scan of abdomen  Treatments: IV hydration  Discharge Exam: Blood pressure 122/63, pulse 98, temperature 99.9 F (37.7 C), temperature source Oral, resp. rate 21, weight 107 lb 2.3 oz (48.6 kg), SpO2 99.00%.  See physical exam on my progress note on day of discharge.  Disposition: Transfer to [facility] at patient and family request.     Medication List    ASK your doctor about these medications       aspirin 81 MG EC tablet  Take 1 tablet (81 mg total) by mouth daily.     cefdinir 300 MG capsule  Commonly known as:  OMNICEF  Take 300 mg by mouth 2 (two) times daily.     metoprolol tartrate 12.5 mg Tabs tablet  Commonly known as:  LOPRESSOR  Take 12.5 mg by mouth 2 (two) times daily.     morphine 15 MG 12 hr tablet  Commonly known as:  MS CONTIN  Take 1 tablet (15 mg total) by mouth every 8 (eight) hours.     pantoprazole 40 MG tablet  Commonly known as:  PROTONIX  Take 1 tablet (40 mg total) by mouth daily.     phenazopyridine 100 MG tablet  Commonly known as:   PYRIDIUM  Take 100 mg by mouth 3 (three) times daily as needed for pain.     UNABLE TO FIND  Med Name:  Morphine injection.  Pt unable to clarify.  Resume prior to admission dosing         Velora Heckler, MD, [facility] Office: [phone]   Signed: Velora Heckler 05/13/2013, 2:37 PM

## 2015-03-07 IMAGING — US US RENAL
1 series · 14 of 25 positions shown · non-contrast
Comparison: none

The *RADIOLOGY REPORT*
CLINICAL DATA: Chronic renal disease.  Prior cystectomy.

RENAL/URINARY TRACT ULTRASOUND COMPLETE

[Series 1: us renal · 0.24mm/px · 14 of 35 slices shown]
[im 1/35]
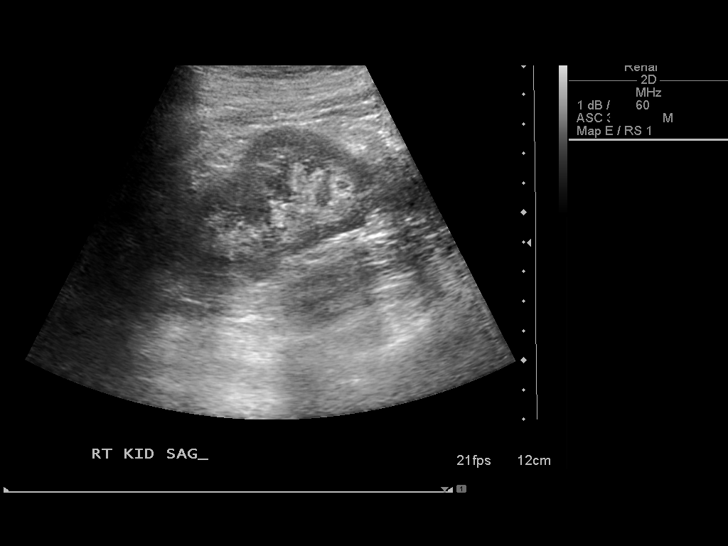
[im 3/35]
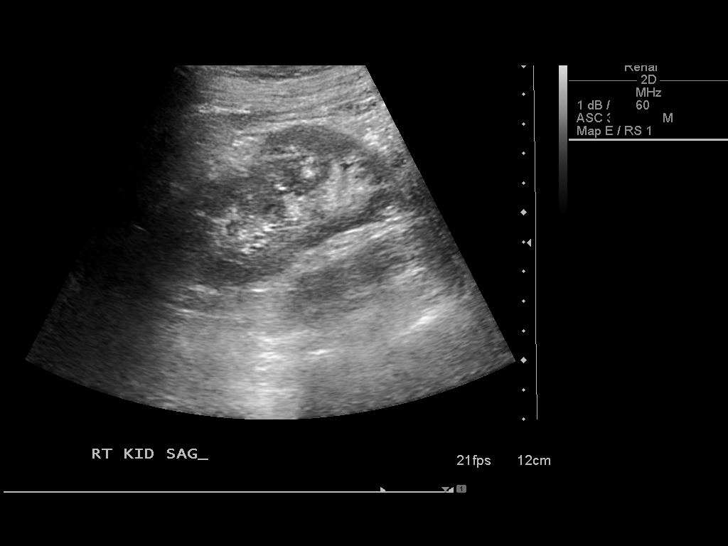
[im 6/35]
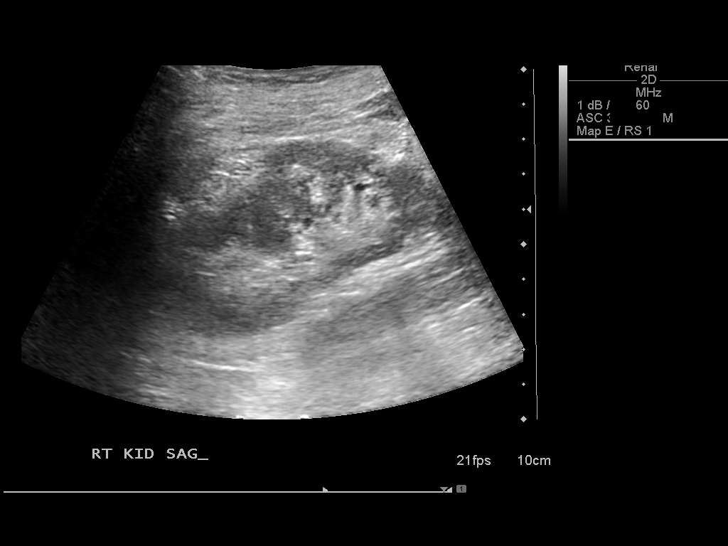
[im 9/35]
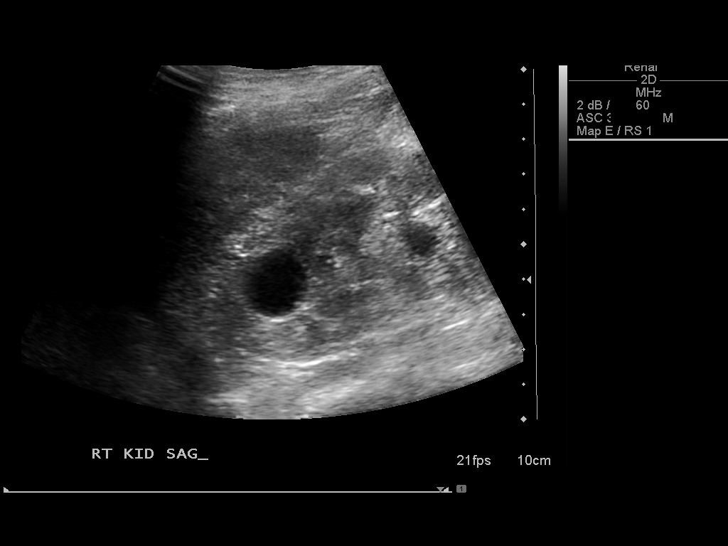
[im 12/35]
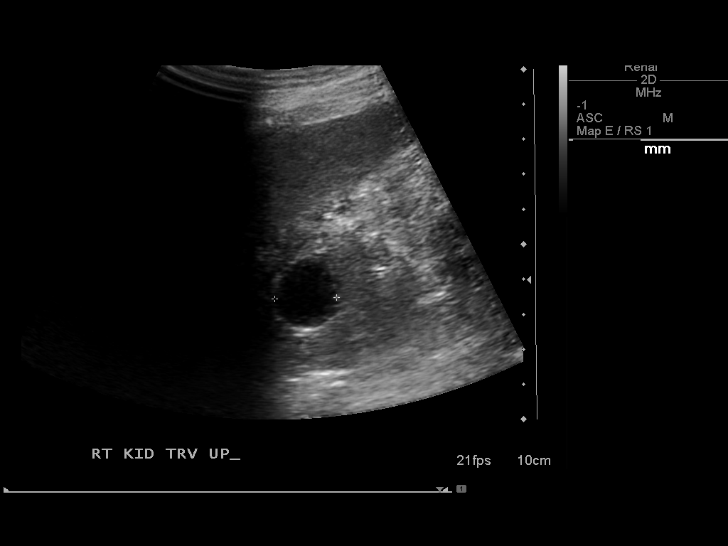
[im 13/35]
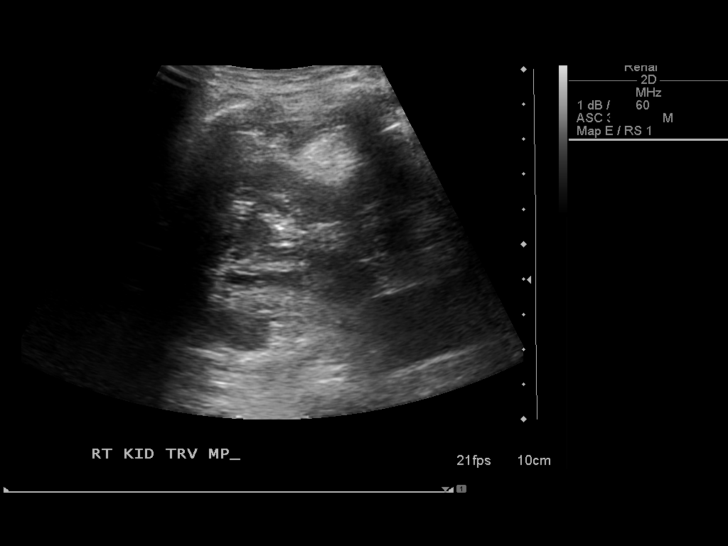
[im 16/35]
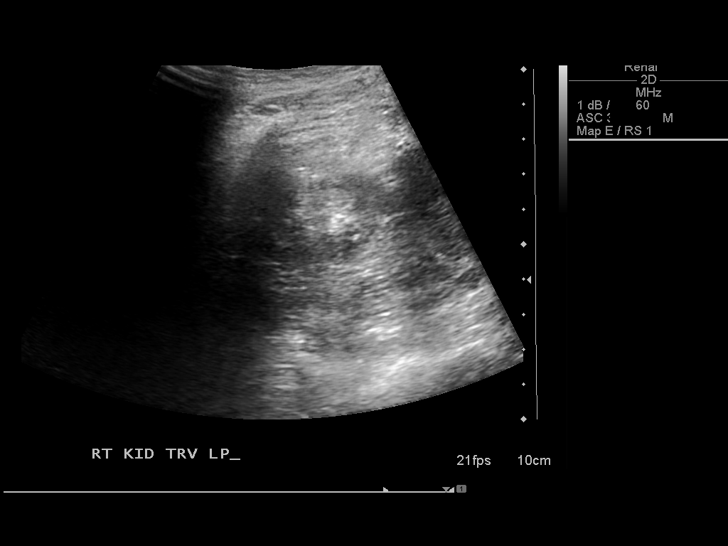
[im 19/35]
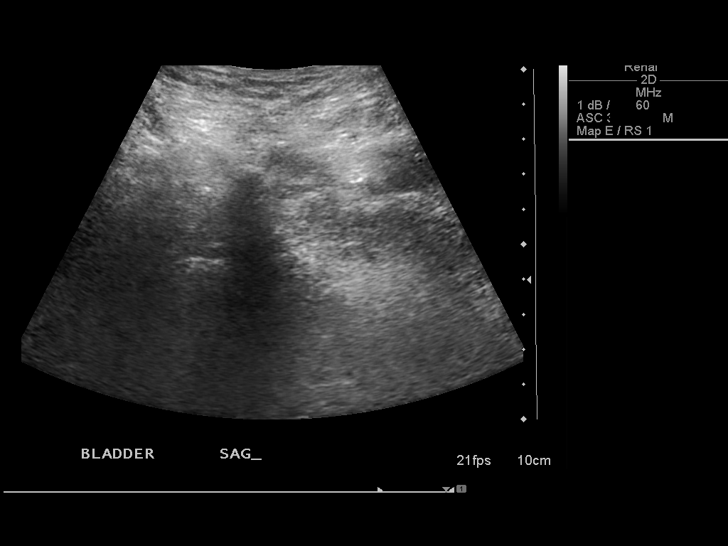
[im 22/35]
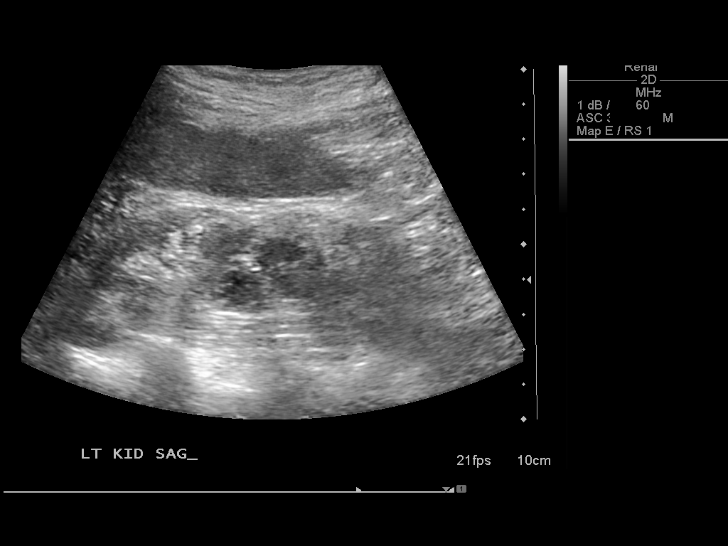
[im 23/35]
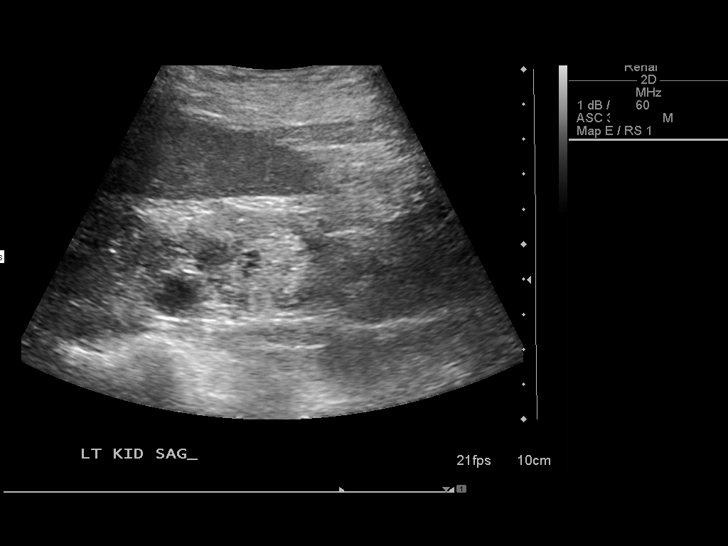
[im 26/35]
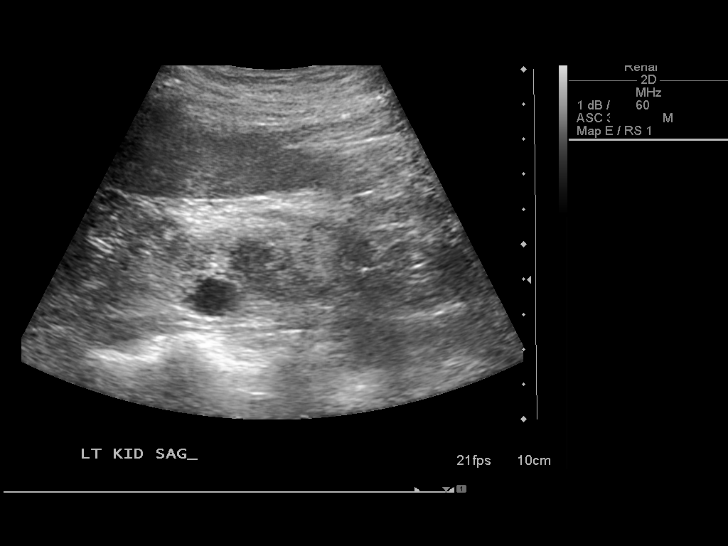
[im 29/35]
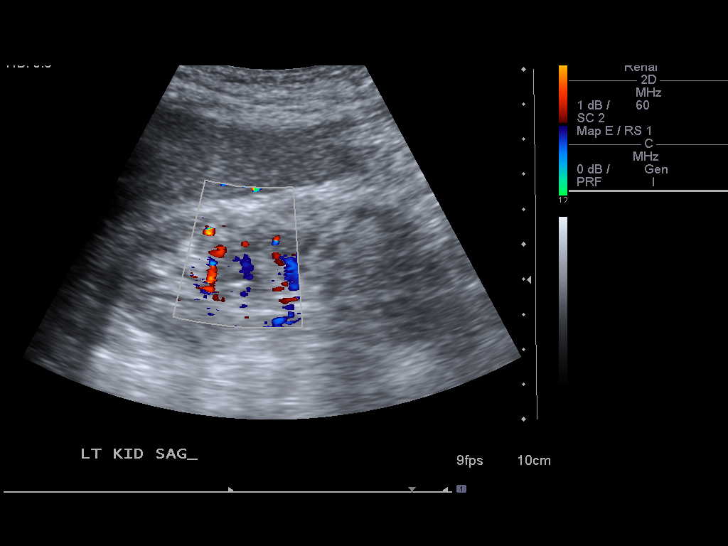
[im 32/35]
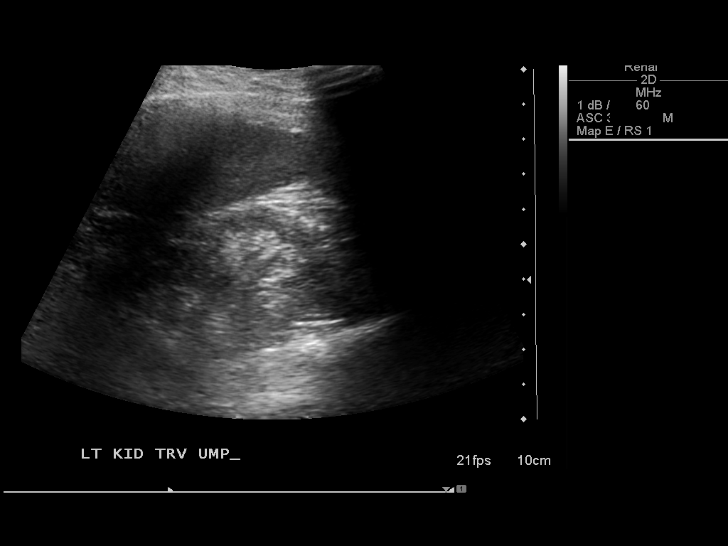
[im 35/35]
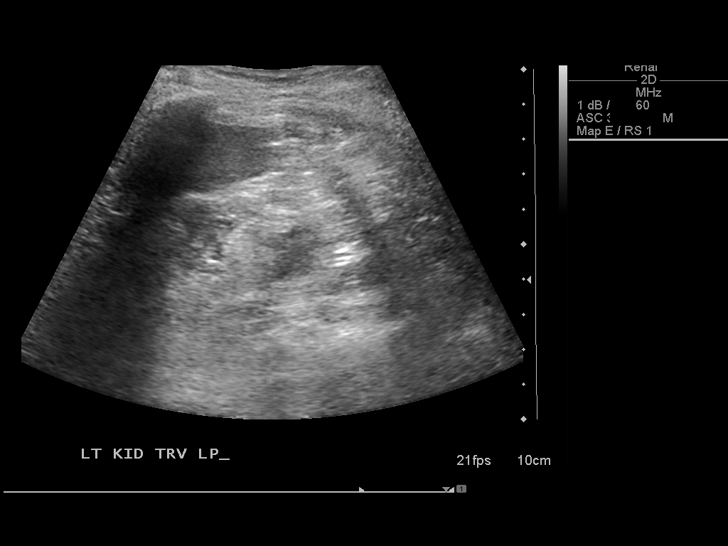

[14 of 25 positions shown; findings below may reference images not displayed]

FINDINGS: Right Kidney = 8.4 cm.  The renal cortex is echogenic mildly
thinned.  No hydronephrosis.

Left kidney = 8.3 cm.  Renal cortex is echogenic and mildly
thinned.  No hydronephrosis.

Bladder:  Surgically absent.
IMPRESSION: 1..  No evidence of hydronephrosis.
2.  Renal cortical thinning.
3.  Echogenic cortex suggests medical renal disease.

## 2015-07-15 IMAGING — US US ABDOMEN COMPLETE
1 series · 13 of 25 positions shown · non-contrast
Comparison: CT abdomen pelvis - 09/19/2012, renal ultrasound -
08/29/2012

CLINICAL DATA: Abdominal pain, history of right-sided
nephrolithiasis, renal insufficiency and cholecystectomy, initial
encounter.

COMPLETE ABDOMINAL ULTRASOUND

[Series 1: us abdomen complete · 0.24mm/px · 13 of 60 slices shown]
[im 1/60]
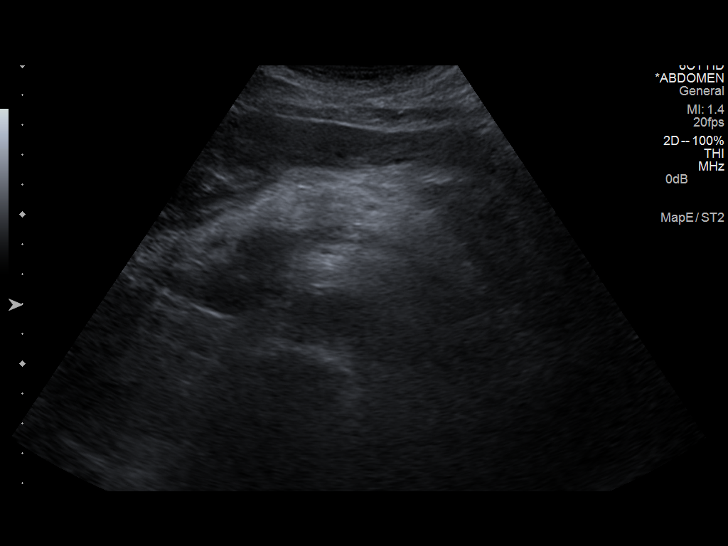
[im 5/60]
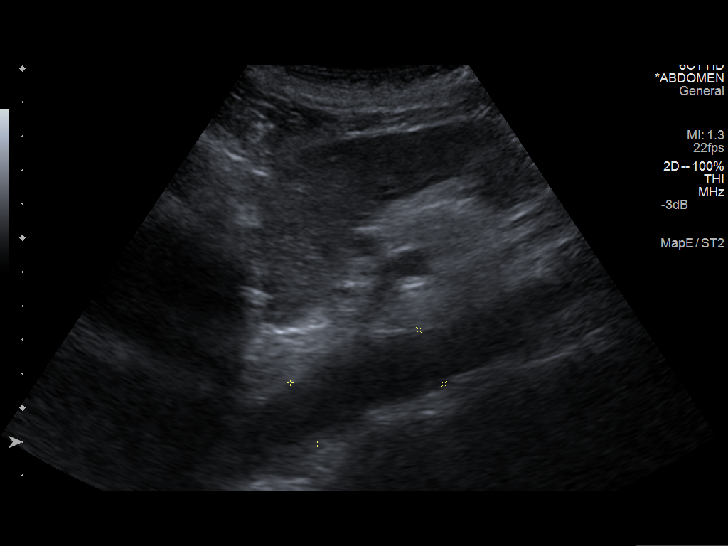
[im 10/60]
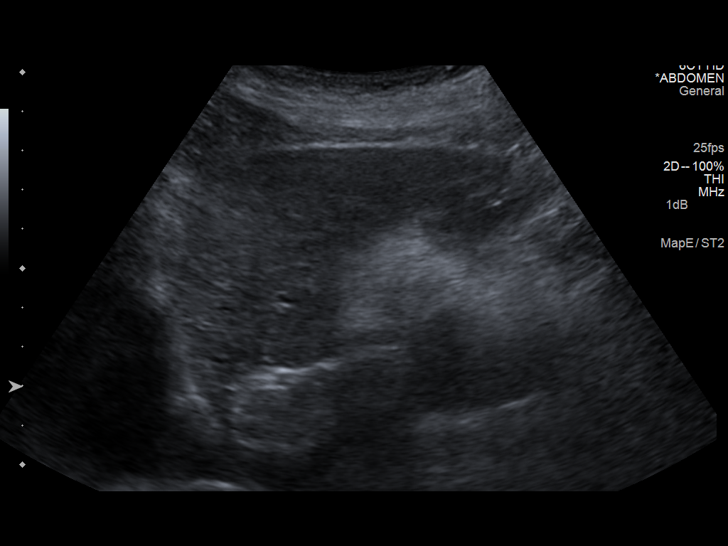
[im 15/60]
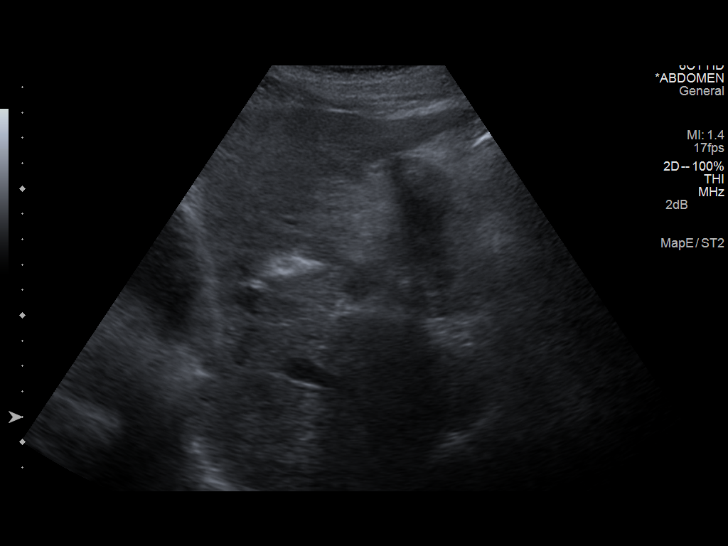
[im 20/60]
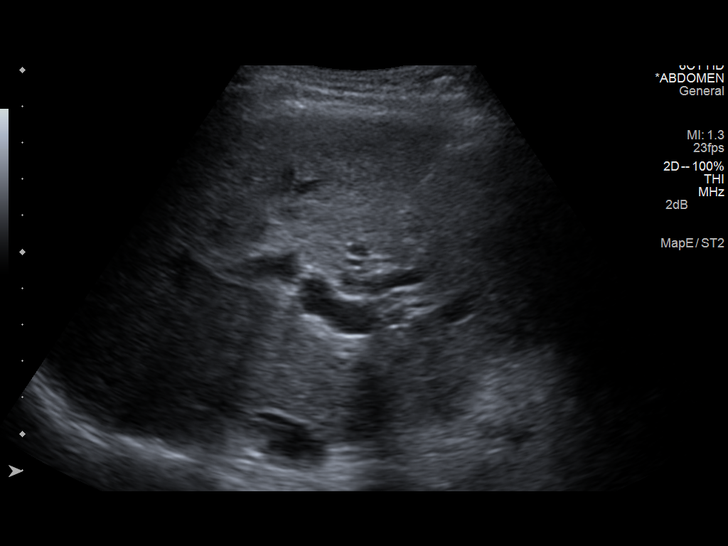
[im 25/60]
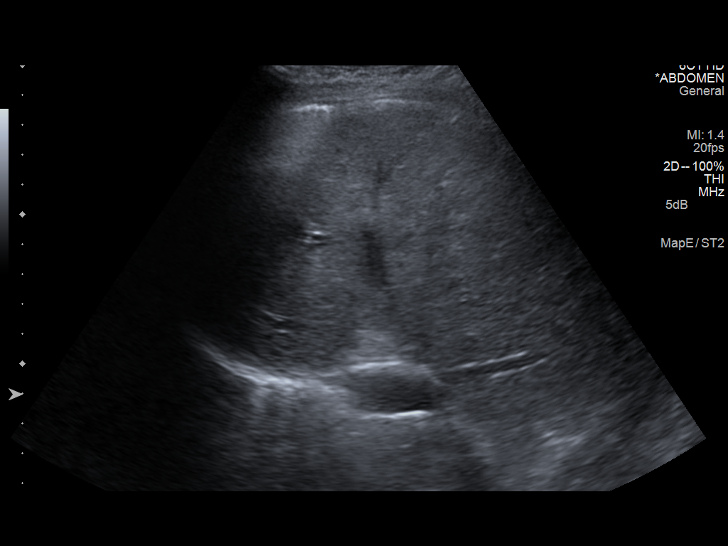
[im 30/60]
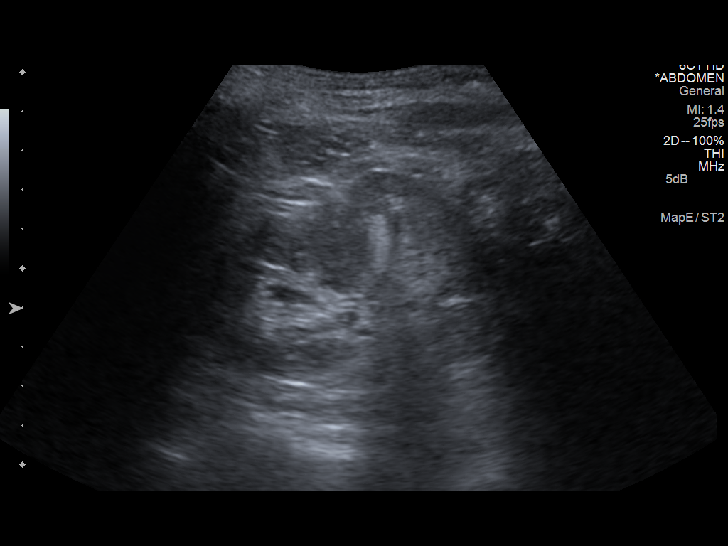
[im 35/60]
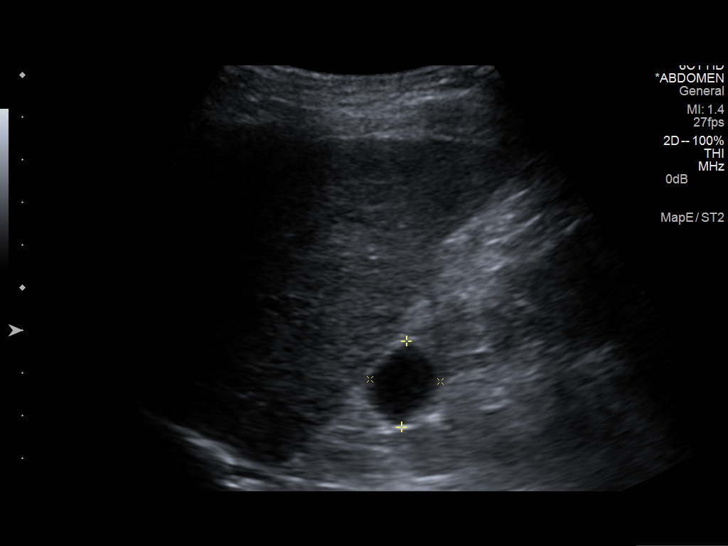
[im 40/60]
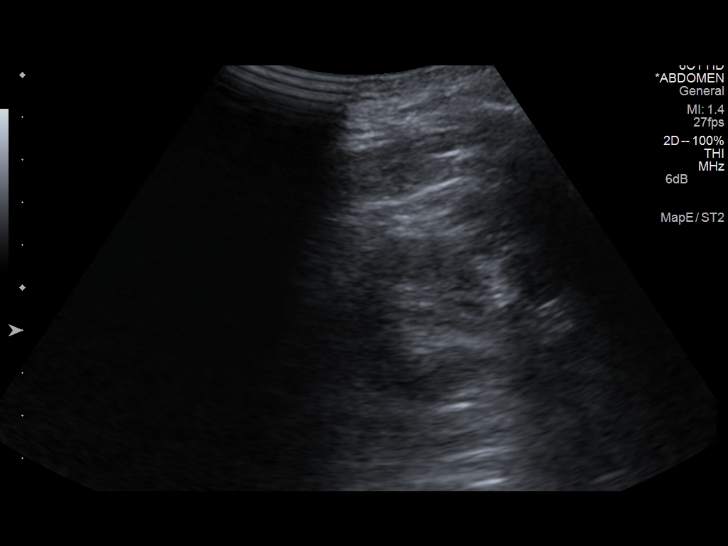
[im 45/60]
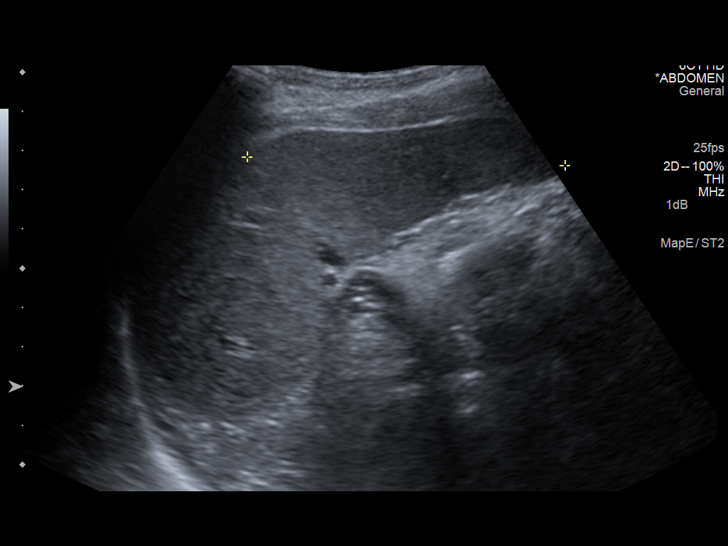
[im 50/60]
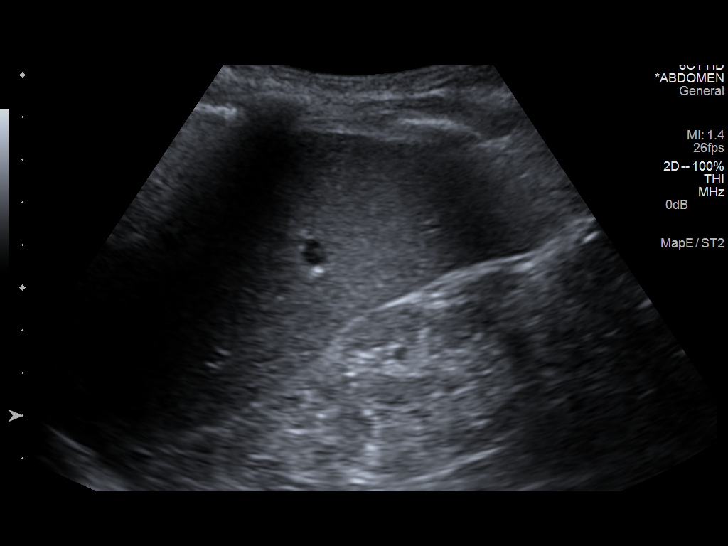
[im 55/60]
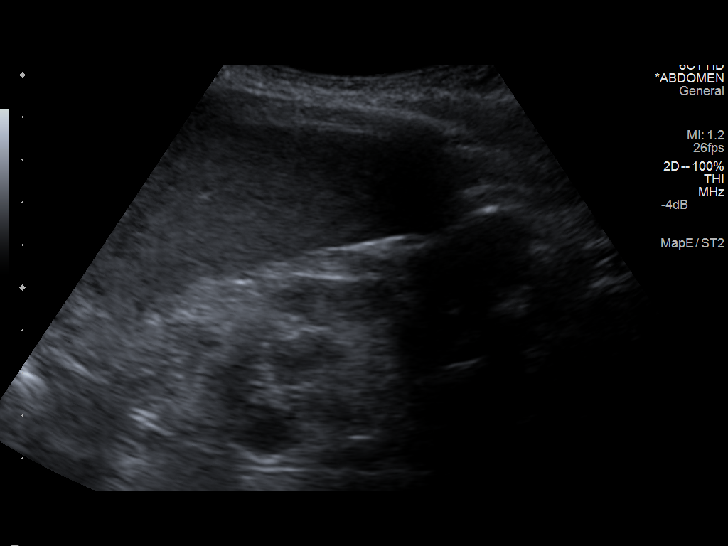
[im 60/60]
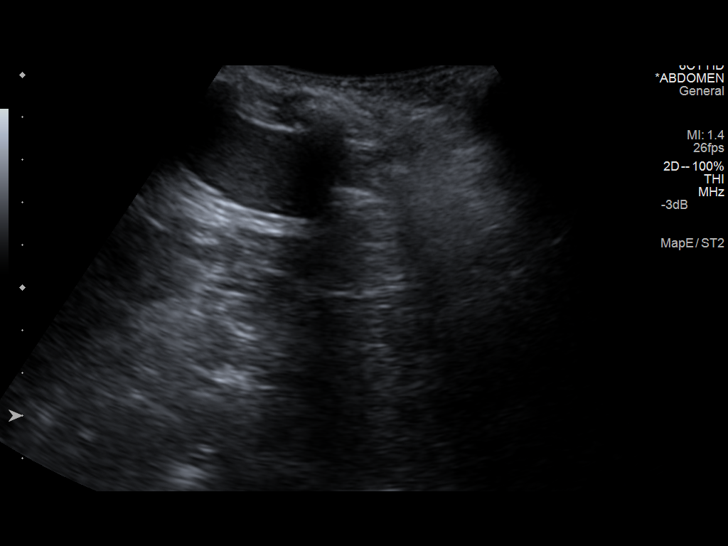

[13 of 25 positions shown; findings below may reference images not displayed]

FINDINGS: Gallbladder:  Surgically absent

Common bile duct:  Borderline enlarged for age measuring 8.9 mm in
diameter, presumably sequelae of postcholecystectomy state.

Liver:  There is mild diffuse slightly coarsened echogenicity of
the hepatic parenchyma.  Mild intrahepatic biliary ductal
dilatation is suspected, similar to prior noncontrast abdominal CT.
No discrete hepatic lesions.  No definite ascites.

IVC:  Appears normal.

Pancreas:  Limited visualization of the pancreatic head and neck is
normal.  Visualization of the pancreatic body and tail is obscured
by bowel gas.

Spleen:  Normal in size measuring 8.1 cm in length.

Right Kidney:  Normal length measuring approximately 9.0 cm.  There
is mild renal cortical thinning and increased cortical echogenicity
suggestive of medical renal disease.  There is an approximately
x 1.7 x 1.7 cm anechoic exophytic cyst arising from the superior
pole of the right kidney, unchanged. No right-sided urinary
obstruction.  No definite echogenic renal stones.

Left Kidney:  Atrophic measuring approximately 6.0 cm in length.
There is diffuse renal cortical atrophy with increased cortical
echogenicity.  There is an approximately 1.2 x 0.9 x 0.9 cm
partially exophytic anechoic structure which arises from the
superior pole left kidney which is too small to accurately
characterize but likely represents a renal cyst, unchanged to
minimally decreased in the interval.  No evidence of left-sided
urinary obstruction.  No definite echogenic renal stones.

Abdominal aorta:  There is a minimal amount of eccentric mixed
echogenic atherosclerotic plaque within a normal caliber abdominal
aorta.
IMPRESSION: 1.  No sonographic explanation for patient's abdominal pain.
Specifically, no evidence of urinary obstruction.
2.  The bilateral kidneys appear atrophic, left greater than right,
compatible with provided history of medical renal disease.

3.  Unchanged approximately 2 cm exophytic right-sided renal cyst.

4.  Post cholecystectomy with associated mild dilatation of the
common bile duct and mild intrahepatic biliary ductal dilatation,
presumably the sequela of postcholecystectomy state and similar to
prior abdominal CT performed [DATE].

## 2015-11-03 IMAGING — CT CT ABD-PELV W/O CM
2 of 6 series · 16 of 46 positions shown, 18 images · non-contrast
Comparison: CT ABD/PELV WO CM dated 02/13/2013; CT ABD/PELV WO CM
dated 09/19/2012

CLINICAL DATA: Pain.  Nausea and vomiting

EXAM:
CT ABDOMEN AND PELVIS WITHOUT CONTRAST
TECHNIQUE: Multidetector CT imaging of the abdomen and pelvis was performed
following the standard protocol without IV contrast.

[Series 2: abd/pel w/o · axial · non-contrast · 0.78mm/px · z∈[-568,-183]mm · 13 of 87 slices shown, 15 images]
[im 5/87  soft-tissue]
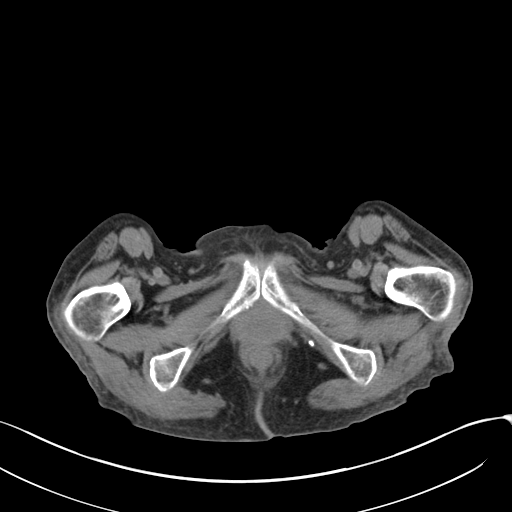
[im 5/87  bone]
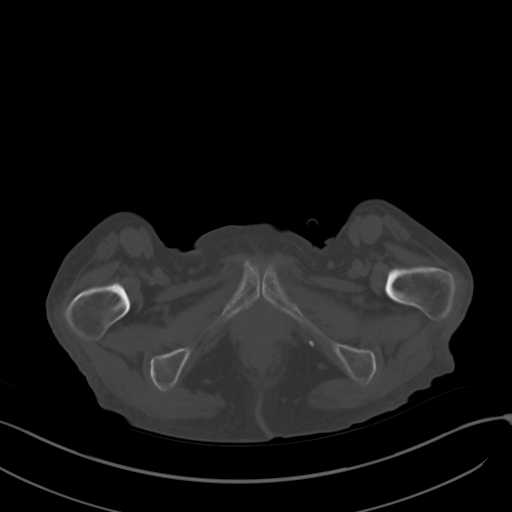
[im 13/87  soft-tissue]
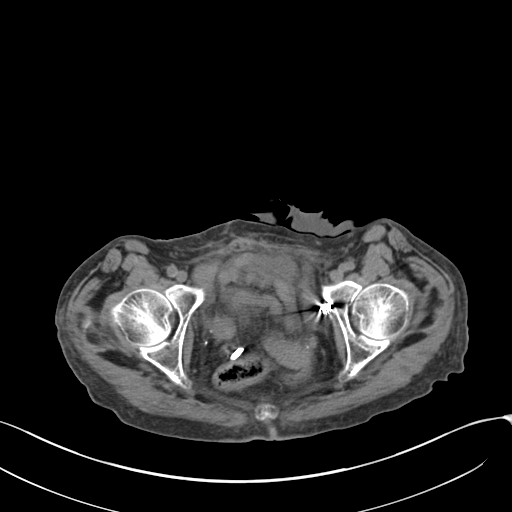
[im 18/87  soft-tissue]
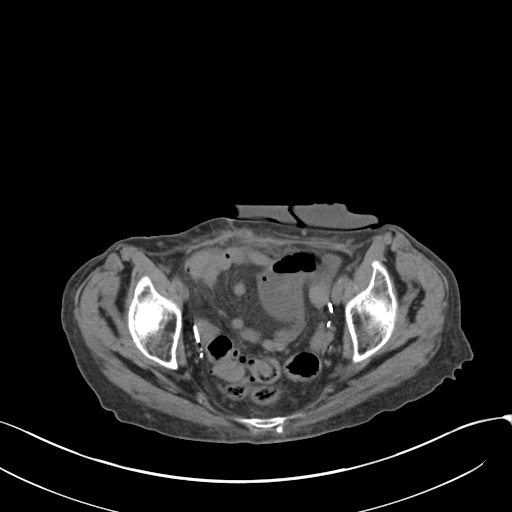
[im 26/87  soft-tissue]
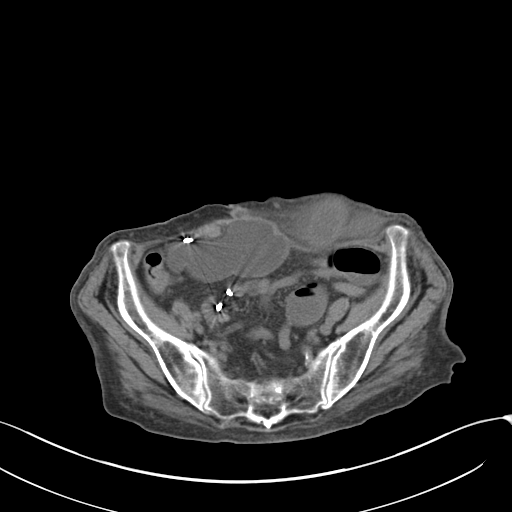
[im 31/87  soft-tissue]
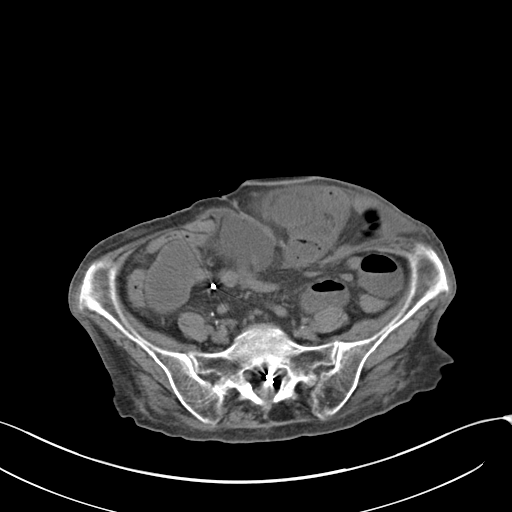
[im 39/87  soft-tissue]
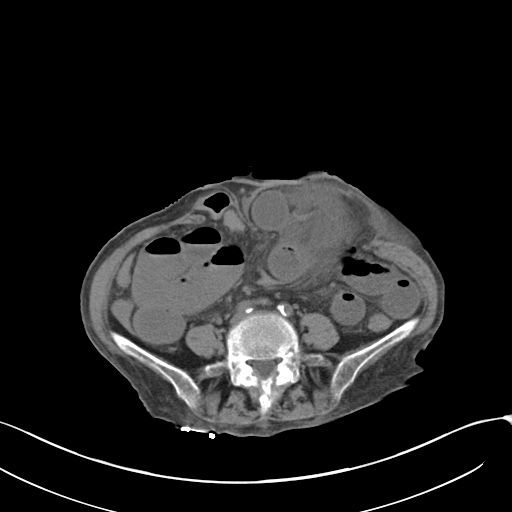
[im 44/87  soft-tissue]
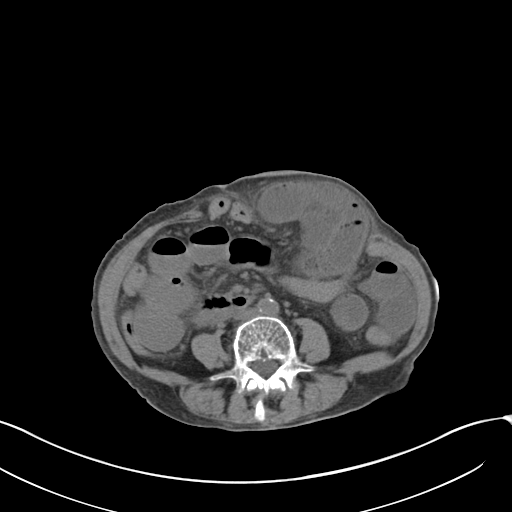
[im 48/87  soft-tissue]
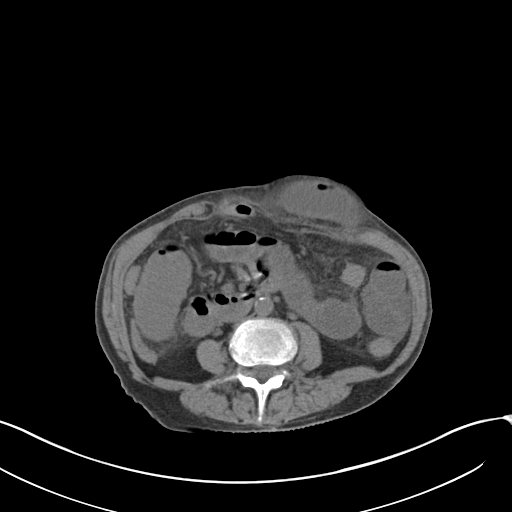
[im 56/87  soft-tissue]
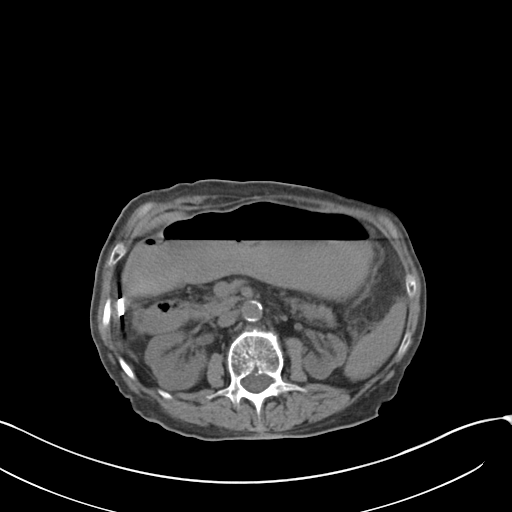
[im 56/87  bone]
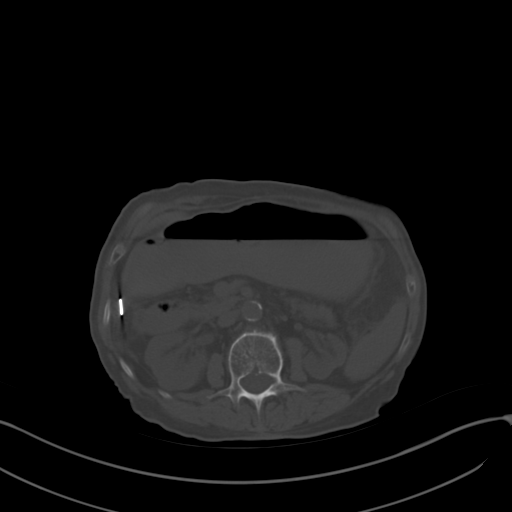
[im 61/87  soft-tissue]
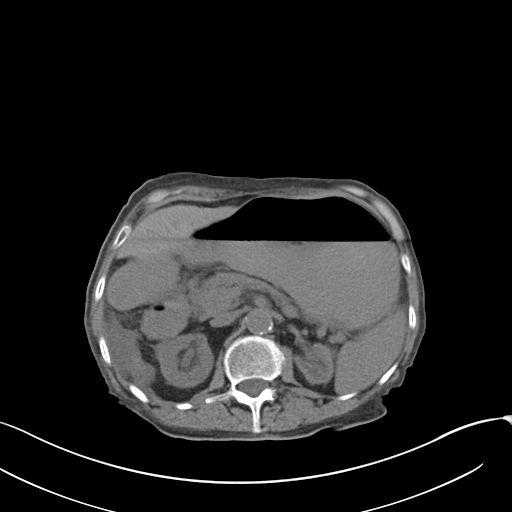
[im 69/87  soft-tissue]
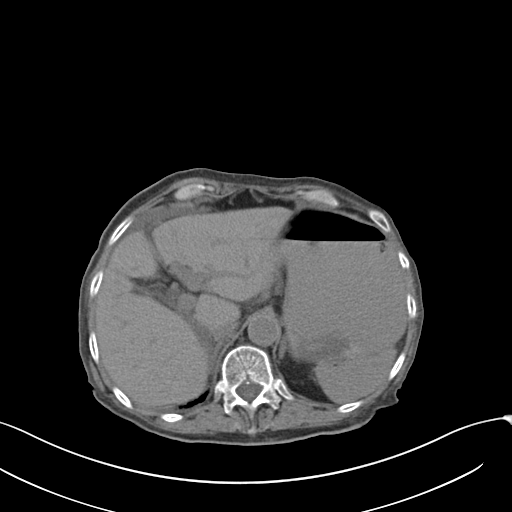
[im 74/87  soft-tissue]
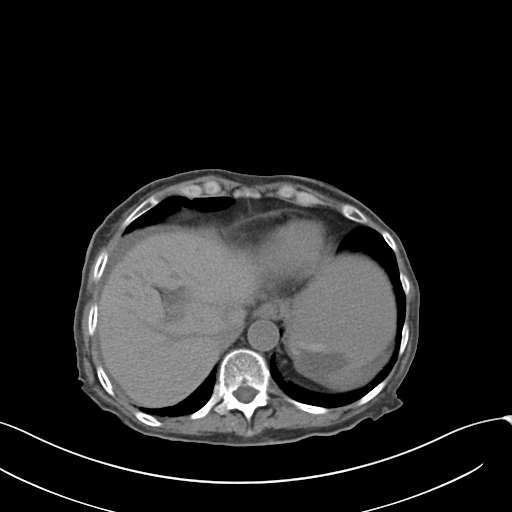
[im 82/87  soft-tissue]
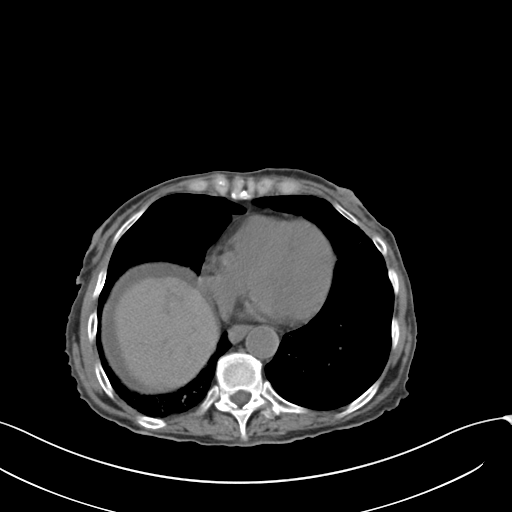

[Series 4: coronal · coronal · 0.75mm/px · 3 of 71 slices shown]
[im 24/71  soft-tissue]
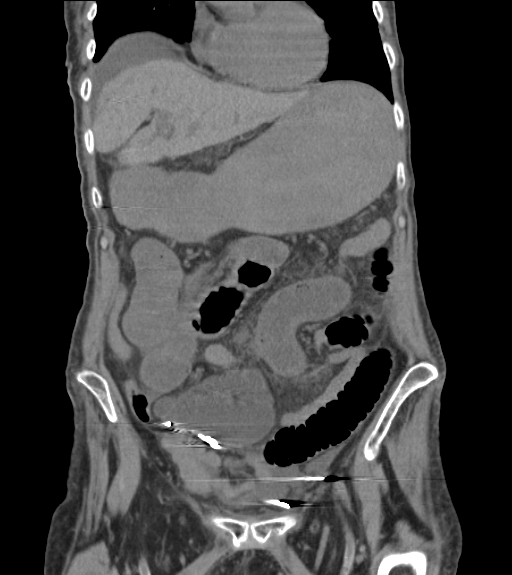
[im 32/71  soft-tissue]
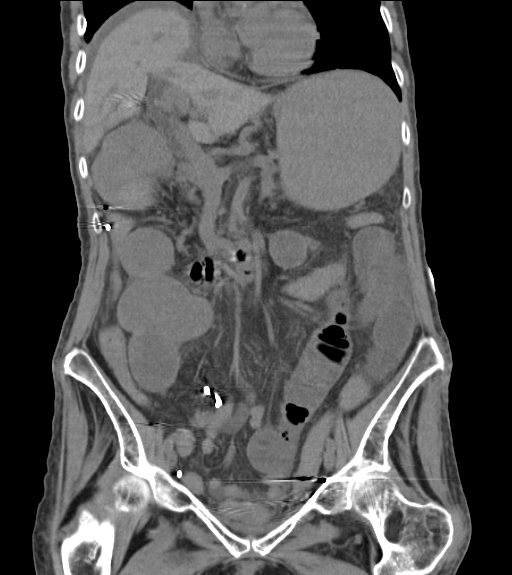
[im 39/71  soft-tissue]
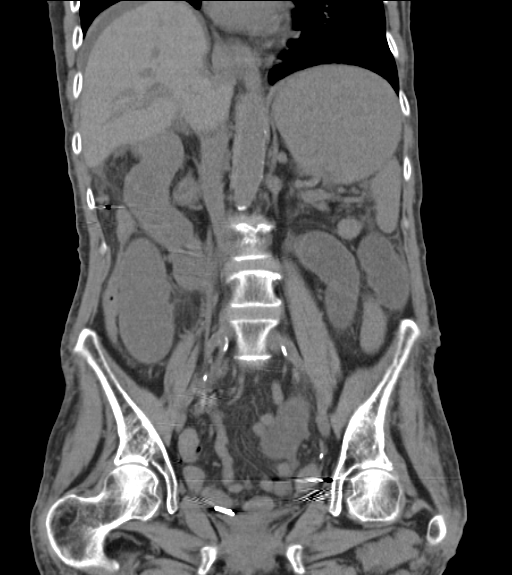

[16 of 46 positions shown; findings below may reference images not displayed]

FINDINGS: Stomach and proximal small bowel are markedly dilated. Air-fluid
levels are present. Distal small bowel is decompressed. Colon is
decompressed. A left peristomal hernia for an ileal conduit is
present containing small bowel loops. There is wall thickening of
some of the bowel loops which are dilated. There is fluid density
within the hernia sac and stranding within the adjacent fat, all
likely due to inflammatory changes. This is the likely transition
point for the area of bowel obstruction.

There is free fluid around the liver.

Postcholecystectomy.

Liver and spleen are unremarkable

Adrenal glands are within normal limits

Kidneys are atrophic and lobulated. Tiny calcifications in the lower
pole of the right kidney.

A fluid collection in the anterior pelvis on image 62 is likely
related to the ileal conduit. The bladder is surgically absent.

No evidence of free intraperitoneal gas. No loculated abscess
collection. Patchy densities at the dependent right lung base
compatible with atelectasis versus airspace disease.

Advanced degenerative disc disease at L2-3. No vertebral compression
deformity.
IMPRESSION: Small bowel obstruction secondary to a left lower quadrant
parastomal hernia for the ileal conduit. Small bowel loops are
inflamed and there is fluid density is well as fatty stranding
within the hernia sac. Ischemic compromise cannot be excluded.

## 2015-11-04 IMAGING — CR DG ABD PORTABLE 1V
1 series · 1 of 1 positions shown · non-contrast
Comparison: CT on 04/27/2013

CLINICAL DATA: Small bowel obstruction

EXAM:
PORTABLE ABDOMEN - 1 VIEW

[ap (kub)]
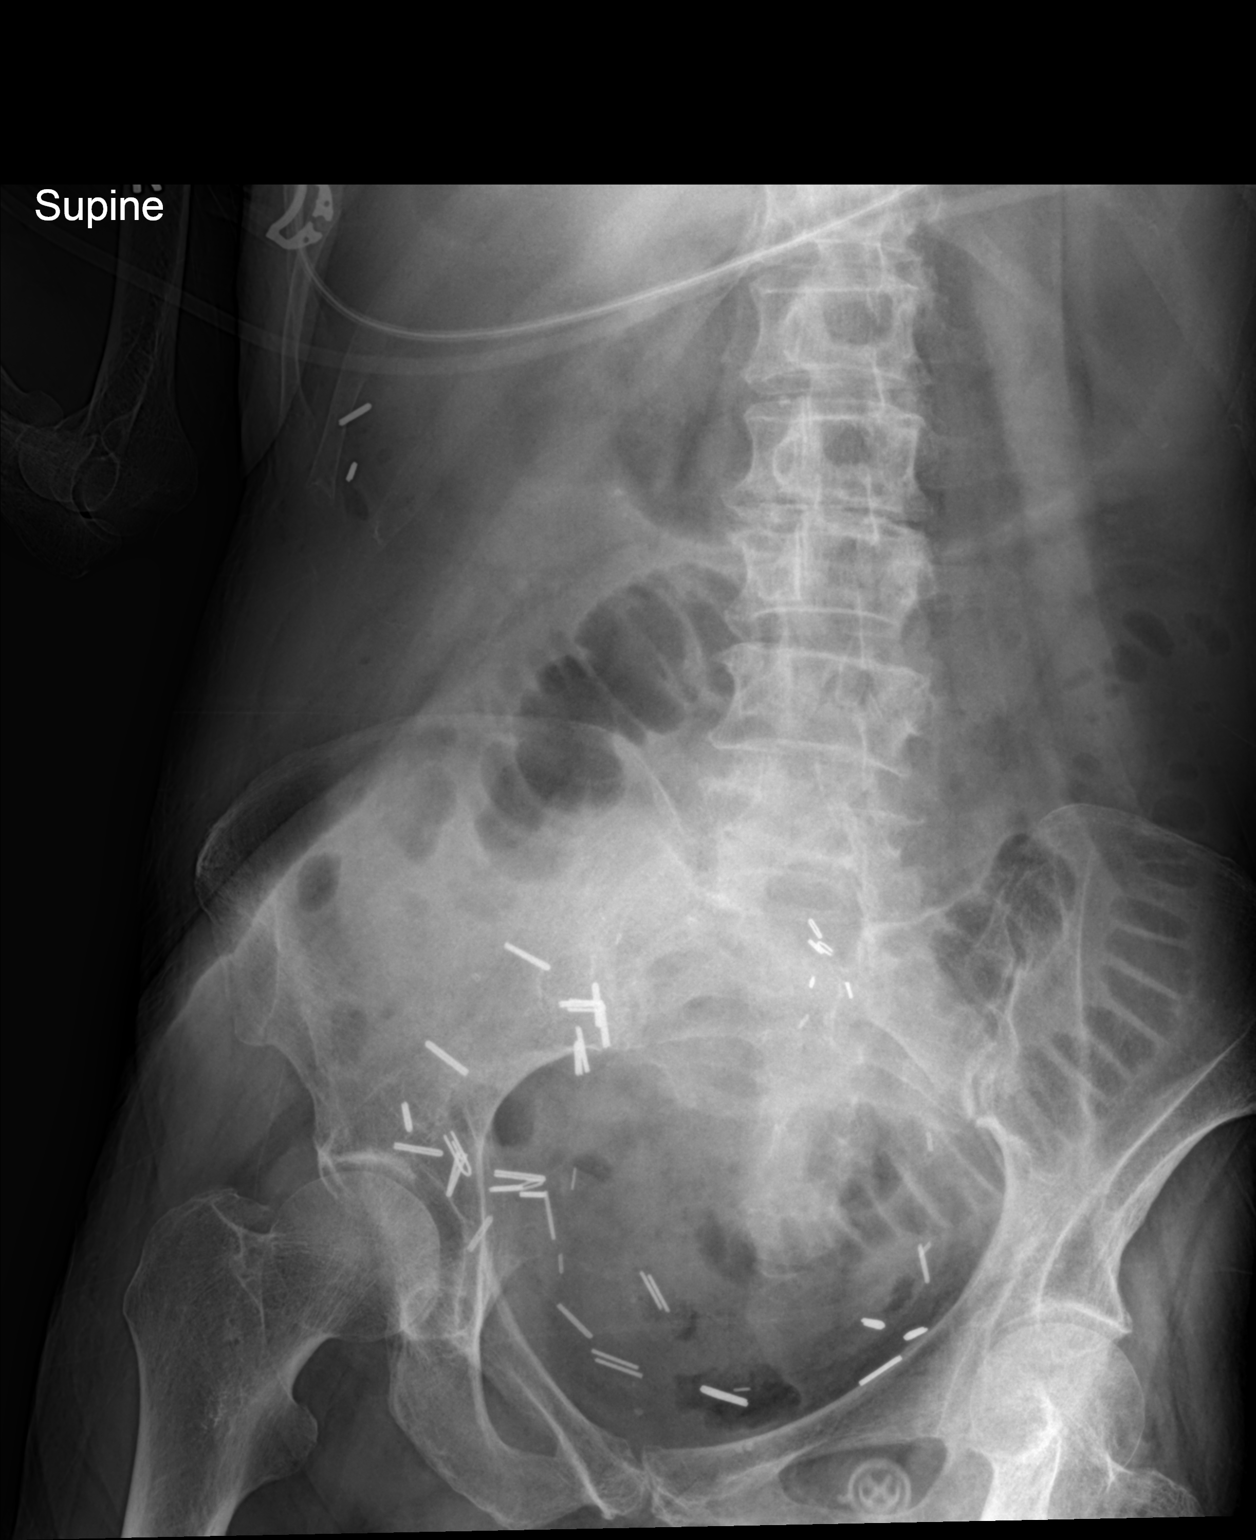

[1 of 1 positions shown; findings below may reference images not displayed]

FINDINGS: Moderately dilated small bowel loops show no significant change.
Persistent paucity of colonic gas is again demonstrated. Multiple
surgical clips again seen within the abdomen and pelvis.
IMPRESSION: Findings consistent with small bowel obstruction. No significant
change compared to recent CT.
